# Patient Record
Sex: Male | Born: 1945 | Race: White | Hispanic: No | Marital: Married | Smoking: Current every day smoker
Health system: Southern US, Community
[De-identification: ages and names within clinical notes are randomized; demographics above are authoritative.]

## PROBLEM LIST (undated history)

## (undated) DIAGNOSIS — J449 Chronic obstructive pulmonary disease, unspecified: Secondary | ICD-10-CM

## (undated) DIAGNOSIS — R0609 Other forms of dyspnea: Secondary | ICD-10-CM

## (undated) DIAGNOSIS — N183 Chronic kidney disease, stage 3 unspecified: Secondary | ICD-10-CM

## (undated) DIAGNOSIS — I10 Essential (primary) hypertension: Secondary | ICD-10-CM

## (undated) DIAGNOSIS — M1990 Unspecified osteoarthritis, unspecified site: Secondary | ICD-10-CM

## (undated) DIAGNOSIS — I6522 Occlusion and stenosis of left carotid artery: Secondary | ICD-10-CM

## (undated) DIAGNOSIS — I739 Peripheral vascular disease, unspecified: Secondary | ICD-10-CM

## (undated) DIAGNOSIS — I639 Cerebral infarction, unspecified: Secondary | ICD-10-CM

## (undated) DIAGNOSIS — R0600 Dyspnea, unspecified: Secondary | ICD-10-CM

## (undated) DIAGNOSIS — E785 Hyperlipidemia, unspecified: Secondary | ICD-10-CM

## (undated) DIAGNOSIS — R609 Edema, unspecified: Secondary | ICD-10-CM

## (undated) HISTORY — DX: Cerebral infarction, unspecified: I63.9

## (undated) HISTORY — PX: OTHER SURGICAL HISTORY: SHX169

## (undated) HISTORY — DX: Essential (primary) hypertension: I10

## (undated) HISTORY — DX: Chronic kidney disease, stage 3 unspecified: N18.30

## (undated) HISTORY — DX: Chronic kidney disease, stage 3 (moderate): N18.3

## (undated) HISTORY — DX: Hyperlipidemia, unspecified: E78.5

## (undated) HISTORY — PX: TENDON REPAIR: SHX5111

## (undated) HISTORY — PX: TONSILLECTOMY: SUR1361

## (undated) HISTORY — DX: Dyspnea, unspecified: R06.00

## (undated) HISTORY — DX: Other forms of dyspnea: R06.09

## (undated) HISTORY — DX: Occlusion and stenosis of left carotid artery: I65.22

## (undated) HISTORY — DX: Edema, unspecified: R60.9

## (undated) HISTORY — PX: LUMBAR DISC SURGERY: SHX700

---

## 2003-10-13 ENCOUNTER — Encounter: Admission: RE | Admit: 2003-10-13 | Discharge: 2003-10-13 | Payer: Self-pay | Admitting: Cardiology

## 2003-10-17 ENCOUNTER — Ambulatory Visit (HOSPITAL_COMMUNITY): Admission: RE | Admit: 2003-10-17 | Discharge: 2003-10-18 | Payer: Self-pay | Admitting: Cardiology

## 2003-10-27 ENCOUNTER — Encounter: Payer: Self-pay | Admitting: Physician Assistant

## 2004-11-11 ENCOUNTER — Ambulatory Visit: Payer: Self-pay | Admitting: Cardiology

## 2004-11-19 ENCOUNTER — Ambulatory Visit: Payer: Self-pay

## 2004-12-01 ENCOUNTER — Ambulatory Visit: Payer: Self-pay | Admitting: Cardiology

## 2004-12-08 ENCOUNTER — Ambulatory Visit: Payer: Self-pay | Admitting: Cardiology

## 2004-12-08 ENCOUNTER — Ambulatory Visit (HOSPITAL_COMMUNITY): Admission: RE | Admit: 2004-12-08 | Discharge: 2004-12-08 | Payer: Self-pay | Admitting: Cardiology

## 2004-12-17 ENCOUNTER — Ambulatory Visit: Payer: Self-pay

## 2005-01-20 ENCOUNTER — Ambulatory Visit: Payer: Self-pay | Admitting: Cardiology

## 2006-06-05 ENCOUNTER — Ambulatory Visit: Payer: Self-pay | Admitting: Cardiology

## 2006-06-22 ENCOUNTER — Encounter (INDEPENDENT_AMBULATORY_CARE_PROVIDER_SITE_OTHER): Payer: Self-pay | Admitting: *Deleted

## 2006-06-22 ENCOUNTER — Inpatient Hospital Stay (HOSPITAL_COMMUNITY): Admission: RE | Admit: 2006-06-22 | Discharge: 2006-06-23 | Payer: Self-pay | Admitting: Vascular Surgery

## 2006-08-03 ENCOUNTER — Ambulatory Visit: Payer: Self-pay | Admitting: Cardiology

## 2006-08-22 ENCOUNTER — Ambulatory Visit: Payer: Self-pay

## 2006-08-22 ENCOUNTER — Ambulatory Visit: Payer: Self-pay | Admitting: Cardiology

## 2006-08-23 ENCOUNTER — Ambulatory Visit (HOSPITAL_COMMUNITY): Admission: RE | Admit: 2006-08-23 | Discharge: 2006-08-23 | Payer: Self-pay | Admitting: Cardiology

## 2006-08-23 ENCOUNTER — Ambulatory Visit: Payer: Self-pay | Admitting: Cardiology

## 2006-09-19 ENCOUNTER — Ambulatory Visit: Payer: Self-pay

## 2006-09-19 ENCOUNTER — Ambulatory Visit: Payer: Self-pay | Admitting: Cardiology

## 2007-01-12 ENCOUNTER — Ambulatory Visit: Payer: Self-pay | Admitting: Vascular Surgery

## 2007-03-22 ENCOUNTER — Ambulatory Visit: Payer: Self-pay | Admitting: Cardiology

## 2007-03-22 LAB — CONVERTED CEMR LAB
BUN: 12 mg/dL (ref 6–23)
CO2: 30 meq/L (ref 19–32)
Calcium: 9.2 mg/dL (ref 8.4–10.5)
Chloride: 101 meq/L (ref 96–112)
Creatinine, Ser: 1.1 mg/dL (ref 0.4–1.5)
GFR calc Af Amer: 87 mL/min
GFR calc non Af Amer: 72 mL/min
Glucose, Bld: 86 mg/dL (ref 70–99)
INR: 0.9 (ref 0.9–2.0)
Potassium: 3.8 meq/L (ref 3.5–5.1)
Prothrombin Time: 11.3 s (ref 10.0–14.0)
Sodium: 138 meq/L (ref 135–145)
aPTT: 31.9 s (ref 26.5–36.5)

## 2007-03-23 ENCOUNTER — Ambulatory Visit: Payer: Self-pay

## 2007-03-28 ENCOUNTER — Observation Stay (HOSPITAL_COMMUNITY): Admission: RE | Admit: 2007-03-28 | Discharge: 2007-03-28 | Payer: Self-pay | Admitting: Cardiology

## 2007-03-28 ENCOUNTER — Ambulatory Visit: Payer: Self-pay | Admitting: Cardiology

## 2007-04-03 ENCOUNTER — Ambulatory Visit (HOSPITAL_COMMUNITY): Admission: RE | Admit: 2007-04-03 | Discharge: 2007-04-03 | Payer: Self-pay | Admitting: Cardiology

## 2007-04-12 ENCOUNTER — Ambulatory Visit: Payer: Self-pay

## 2007-05-24 ENCOUNTER — Ambulatory Visit: Payer: Self-pay | Admitting: Cardiology

## 2007-07-11 ENCOUNTER — Ambulatory Visit: Payer: Self-pay | Admitting: Vascular Surgery

## 2007-09-18 ENCOUNTER — Encounter: Admission: RE | Admit: 2007-09-18 | Discharge: 2007-09-18 | Payer: Self-pay | Admitting: Neurosurgery

## 2007-11-20 ENCOUNTER — Ambulatory Visit: Payer: Self-pay

## 2007-11-20 ENCOUNTER — Ambulatory Visit: Payer: Self-pay | Admitting: Cardiovascular Disease

## 2007-12-28 ENCOUNTER — Inpatient Hospital Stay (HOSPITAL_COMMUNITY): Admission: RE | Admit: 2007-12-28 | Discharge: 2008-01-02 | Payer: Self-pay | Admitting: Neurosurgery

## 2008-07-16 ENCOUNTER — Ambulatory Visit: Payer: Self-pay | Admitting: Vascular Surgery

## 2008-10-17 ENCOUNTER — Ambulatory Visit: Payer: Self-pay | Admitting: Cardiovascular Disease

## 2008-10-22 ENCOUNTER — Ambulatory Visit: Payer: Self-pay | Admitting: Cardiovascular Disease

## 2008-10-22 ENCOUNTER — Ambulatory Visit (HOSPITAL_COMMUNITY): Admission: RE | Admit: 2008-10-22 | Discharge: 2008-10-22 | Payer: Self-pay | Admitting: Cardiovascular Disease

## 2008-11-12 ENCOUNTER — Ambulatory Visit: Payer: Self-pay | Admitting: Cardiovascular Disease

## 2008-11-12 ENCOUNTER — Ambulatory Visit: Payer: Self-pay

## 2009-01-09 ENCOUNTER — Ambulatory Visit: Payer: Self-pay | Admitting: Cardiovascular Disease

## 2009-01-12 ENCOUNTER — Ambulatory Visit: Payer: Self-pay

## 2009-02-23 ENCOUNTER — Inpatient Hospital Stay (HOSPITAL_COMMUNITY): Admission: RE | Admit: 2009-02-23 | Discharge: 2009-02-26 | Payer: Self-pay | Admitting: Orthopedic Surgery

## 2009-03-12 DIAGNOSIS — I70219 Atherosclerosis of native arteries of extremities with intermittent claudication, unspecified extremity: Secondary | ICD-10-CM | POA: Insufficient documentation

## 2009-03-12 DIAGNOSIS — I10 Essential (primary) hypertension: Secondary | ICD-10-CM | POA: Insufficient documentation

## 2009-03-19 ENCOUNTER — Telehealth: Payer: Self-pay | Admitting: Cardiovascular Disease

## 2009-05-20 ENCOUNTER — Ambulatory Visit: Payer: Self-pay

## 2009-05-20 ENCOUNTER — Encounter: Payer: Self-pay | Admitting: Cardiovascular Disease

## 2009-07-15 ENCOUNTER — Ambulatory Visit: Payer: Self-pay | Admitting: Vascular Surgery

## 2009-11-23 ENCOUNTER — Encounter: Payer: Self-pay | Admitting: Cardiovascular Disease

## 2009-11-24 ENCOUNTER — Encounter: Payer: Self-pay | Admitting: Cardiovascular Disease

## 2009-11-24 ENCOUNTER — Ambulatory Visit: Payer: Self-pay

## 2010-01-08 ENCOUNTER — Ambulatory Visit: Payer: Self-pay | Admitting: Vascular Surgery

## 2010-07-13 ENCOUNTER — Telehealth: Payer: Self-pay | Admitting: Cardiovascular Disease

## 2010-08-05 ENCOUNTER — Ambulatory Visit: Payer: Self-pay | Admitting: Vascular Surgery

## 2010-09-01 ENCOUNTER — Ambulatory Visit (HOSPITAL_COMMUNITY)
Admission: RE | Admit: 2010-09-01 | Discharge: 2010-09-01 | Payer: Self-pay | Source: Home / Self Care | Admitting: Vascular Surgery

## 2010-09-09 ENCOUNTER — Inpatient Hospital Stay (HOSPITAL_COMMUNITY): Admission: RE | Admit: 2010-09-09 | Discharge: 2010-09-11 | Payer: Self-pay | Admitting: Vascular Surgery

## 2010-09-10 ENCOUNTER — Ambulatory Visit: Payer: Self-pay | Admitting: Vascular Surgery

## 2010-09-16 ENCOUNTER — Telehealth: Payer: Self-pay | Admitting: Cardiovascular Disease

## 2010-09-20 ENCOUNTER — Ambulatory Visit: Payer: Self-pay | Admitting: Cardiovascular Disease

## 2010-09-23 ENCOUNTER — Ambulatory Visit: Payer: Self-pay | Admitting: Cardiovascular Disease

## 2010-09-23 ENCOUNTER — Ambulatory Visit: Payer: Self-pay | Admitting: Vascular Surgery

## 2010-10-21 ENCOUNTER — Telehealth: Payer: Self-pay | Admitting: Cardiovascular Disease

## 2010-11-12 ENCOUNTER — Encounter: Payer: Self-pay | Admitting: Cardiovascular Disease

## 2010-11-15 ENCOUNTER — Ambulatory Visit: Payer: Self-pay

## 2010-11-17 ENCOUNTER — Encounter: Payer: Self-pay | Admitting: Cardiovascular Disease

## 2011-01-06 NOTE — Progress Notes (Signed)
Summary: b/p readings   Phone Note Call from Patient Call back at Home Phone [phone]   Caller: Patient Reason for Call: Talk to Nurse Summary of Call: Per pt calling b/p average 175/70.  Initial call taken by: Neil Crouch,  October 21, 2010 1:08 PM  Follow-up for Phone Call        Pt cell [phone]  pt returned call [place]  October 21, 2010 4:06 PM  I spoke with the pt and on average his BP has been running 175/70. The pt's pulse has been in the upper 50s and lower 60s.  The pt did run out of his Atenolol-Chlorthalidone about 3 days ago and his PCP would not refill the medicine because he wants Cardiology to manage BP.  I instructed the pt to take an extra Amlodipine tonight for his BP.  I will speak with Dr Burt Knack about further medication recommendations and call the pt tomorrow.  If Dr Burt Knack would like to continue Atenolol-Chlorthalidone then I will send in a refill tomorrow.  Follow-up by: Theodosia Quay, RN, BSN,  October 21, 2010 4:47 PM  Additional Follow-up for Phone Call Additional follow up Details #1::        Per Dr Burt Knack he would like the pt to Discontinue Atenolol-Chlorthalidone and START Carvedilol 12.5mg   two times a day and INCREASE Amlodipine to 10mg  daily.  Dr Burt Knack would like the pt to continue monitoring his BP at home and call back in  10 days with readings.  I spoke with the pt and reviewed his medication changes.  New prescriptions sent to [facility] in [place]. The pt will call the office with his BP readings.   Additional Follow-up by: Theodosia Quay, RN, BSN,  October 22, 2010 3:28 PM    New/Updated Medications: CARVEDILOL 12.5 MG TABS (CARVEDILOL) Take one tablet by mouth twice a day AMLODIPINE BESYLATE 10 MG TABS (AMLODIPINE BESYLATE) Take one tablet by mouth daily Prescriptions: AMLODIPINE BESYLATE 10 MG TABS (AMLODIPINE BESYLATE) Take one tablet by mouth daily  #30 x 3   Entered by:   Theodosia Quay, RN, BSN   Authorized by:    Neale Burly, MD   Signed by:   Theodosia Quay, RN, BSN on 10/22/2010   Method used:   Electronically to        [facility] [phone]* (retail)       [address], [place]  [address]       Ph: [phone]       Fax: [phone]   RxID:   [id number] CARVEDILOL 12.5 MG TABS (CARVEDILOL) Take one tablet by mouth twice a day  #60 x 3   Entered by:   Theodosia Quay, RN, BSN   Authorized by:   Neale Burly, MD   Signed by:   Theodosia Quay, RN, BSN on 10/22/2010   Method used:   Electronically to        [facility] [phone]* (retail)       [address],   [address]       Ph: [phone]       Fax: [phone]   RxID:   [id number]

## 2011-01-06 NOTE — Miscellaneous (Signed)
Summary: Orders Update  Clinical Lists Changes  Orders: Added new Test order of Arterial Duplex Lower Extremity (Arterial Duplex Low) - Signed 

## 2011-01-06 NOTE — Assessment & Plan Note (Signed)
Summary: f/u BP  Medications Added PLAVIX 75 MG TABS (CLOPIDOGREL BISULFATE) Take one tablet by mouth daily ATENOLOL-CHLORTHALIDONE 50-25 MG TABS (ATENOLOL-CHLORTHALIDONE) Take 1 tablet by mouth once a day CRESTOR 10 MG TABS (ROSUVASTATIN CALCIUM) Take one tablet by mouth daily. AMLODIPINE BESYLATE 5 MG TABS (AMLODIPINE BESYLATE) Take one tablet by mouth daily TRIAMTERENE-HCTZ 75-50 MG TABS (TRIAMTERENE-HCTZ) Take 1 tablet by mouth once a day CELEBREX 200 MG CAPS (CELECOXIB) Take 1 tablet by mouth once a day\par      Allergies Added: NKDA  Visit Type:  Follow-up Primary Imunique Samad:  [place]  CC:  High blood pressure.  History of Present Illness: 65 year-old male with lower extremity peripheral arterial disease and carotid disease, presenting for followup evaluation. He has had recent carotid stenting to treat restenosis from prior endarterectomy. He has also noted elevated blood pressure readings of late. Lisinopril was discontinued in the hospital because of elevated creatinine.  He denies chest pain or claudication symptoms at present. He does complain of back pain. No palps, lightheadedness, or syncope. Reports mild leg swelling.  Current Medications (verified): 1)  Plavix 75 Mg Tabs (Clopidogrel Bisulfate) .... Take One Tablet By Mouth Daily 2)  Atenolol-Chlorthalidone 50-25 Mg Tabs (Atenolol-Chlorthalidone) .... Take 1 Tablet By Mouth Once A Day 3)  Crestor 10 Mg Tabs (Rosuvastatin Calcium) .... Take One Tablet By Mouth Daily. 4)  Amlodipine Besylate 5 Mg Tabs (Amlodipine Besylate) .... Take One Tablet By Mouth Daily 5)  Triamterene-Hctz 75-50 Mg Tabs (Triamterene-Hctz) .... Take 1 Tablet By Mouth Once A Day 6)  Celebrex 200 Mg Caps (Celecoxib) .... Take 1 Tablet By Mouth Once A Day\par  Allergies (verified): No Known Drug Allergies  Past History:  Past medical history reviewed for relevance to current acute and chronic problems.  Past Medical History: Reviewed history from  01/20/2009 and no changes required. 1.Carotid endarterectomy. 2. Stent in the lower extremity. 3.lower extremity peripheral arterial disease  4.stenting of both  superficial femoral arteries.  5. balloon angioplasty. (Most recent done 11/09) 6. Hypertension. 7. Exertional dyspnea. 8. Edema. 9.Hyperlipidemia. 10. 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  Review of Systems       Negative except as per HPI   Vital Signs:  Patient profile:   65 year old male Height:      74 inches Weight:      227.25 pounds BMI:     29.28 Pulse rate:   52 / minute Pulse rhythm:   regular Resp:     18 per minute BP sitting:   168 / 88  (left arm) Cuff size:   large  Vitals Entered By: Sidney Ace (September 20, 2010 3:48 PM)  Physical Exam  General:  Pt is alert and oriented, in no acute distress. HEENT: normal Neck: normal carotid upstrokes without bruits, JVP normal Lungs: CTA CV: RRR without murmur or gallop Abd: soft, NT, positive BS, no bruit, no organomegaly Ext: no clubbing, cyanosis, or edema. 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 Tobacco cessation discussed. Continue medical therapy.  Problem # 2:  HYPERTENSION, BENIGN (ICD-401.1)  BP elevated, but amlodipine just restarted a few days ago. Asked pt to  record BP's at home and bring readings in at followup. Reviewed hospital records and Creatinine was initially 1.9 and fell to 1.4 at discharge with disconitnueation of the ACE-inhibitor. He will call back in 2 weeks with readings and will determine at that time if he needs an additional agent.  The following medications were removed from the  medication list:    Lisinopril 10 Mg Tabs (Lisinopril) ..... Once daily His updated medication list for this problem includes:    Atenolol-chlorthalidone 50-25 Mg Tabs (Atenolol-chlorthalidone) .[name]... Take 1 tablet by mouth once a day    Amlodipine Besylate 5 Mg Tabs (Amlodipine besylate) .[name]... Take one tablet by mouth daily    Triamterene-hctz 75-50 Mg Tabs (Triamterene-hctz) .[name]... Take 1 tablet by mouth once a day  BP today: 168/88  Labs Reviewed: K+: 3.8 (03/22/2007) Creat: : 1.1 (03/22/2007)     Patient Instructions: 1)  Your physician recommends that you schedule a follow-up appointment in: 6 months  2)  Your physician recommends that you continue on your current medications as directed. Please refer to the Current Medication list given to you today. 3)  Please call us back with your BP readings.

## 2011-01-06 NOTE — Progress Notes (Signed)
Summary: Test and OV   Phone Note Call from Patient Call back at Home Phone [phone]   Caller: Patient Reason for Call: Talk to Nurse, Lab or Test Results Details for Reason: from 6/16 & 12/10. pt states he never receive any test results.  Initial call taken by: Neil Crouch,  July 13, 2010 10:57 AM  Follow-up for Phone Call        I called the pt's home number and his wife gave me alternate numbers to call [phone] or  [phone] (cell).   I spoke with the pt and reviewed the results of his tests (these results were called per documentation).  The pt is due for LEA in December and he is scheduled to see Dr Burt Knack in September.  We can cancel OV in September and arrange both OV and LEA with ABI (440.21) in December for pt.  The pt was concerned because he had received a call from [facility] about his refills.  This pt can get this medication refilled through December.  I will forward this message to Lela to contact the pt with new appointments in December.  Follow-up by: Theodosia Quay, RN, BSN,  July 13, 2010 11:27 AM

## 2011-01-06 NOTE — Progress Notes (Signed)
Summary: BP issues   Phone Note Call from Patient Call back at Home Phone [phone]   Caller: Patient Reason for Call: Talk to Nurse Summary of Call: pt states he feels that he needs to be seen sooner that December. He states he is having some BP issues that his PCP feels cardiology should see him for.  Initial call taken by: Lorraine Lax,  September 16, 2010 12:54 PM  Follow-up for Phone Call        The pt is currently not at home.  I will try to reach the pt on cell [phone]. I spoke with the pt and he is having problems with his BP running high.  The pt had a carotid stent placed last Friday by Dr Oneida Alar. During his hospitalization his BP was high and his pulse was in the 40s.  5 weeks prior to stent placement his Lisinopril was stopped because it was "harming his kidneys".  The pt saw his PCP today and was started on Amlodipine 5mg  daily.   The pt said his PCP wanted him to follow-up with Dr Burt Knack.  I arranged an appt for the pt on 09/20/10.  The pt will monitor his BP at home and bring in his readings and automatic cuff to appt.  Follow-up by: Theodosia Quay, RN, BSN,  September 16, 2010 3:34 PM

## 2011-02-16 LAB — CBC
HCT: 35.2 % — ABNORMAL LOW (ref 39.0–52.0)
HCT: 41.5 % (ref 39.0–52.0)
Hemoglobin: 12 g/dL — ABNORMAL LOW (ref 13.0–17.0)
Hemoglobin: 14.6 g/dL (ref 13.0–17.0)
MCH: 30.3 pg (ref 26.0–34.0)
MCH: 31.4 pg (ref 26.0–34.0)
MCHC: 34.1 g/dL (ref 30.0–36.0)
MCHC: 35.2 g/dL (ref 30.0–36.0)
MCV: 88.9 fL (ref 78.0–100.0)
MCV: 89.2 fL (ref 78.0–100.0)
Platelets: 145 10*3/uL — ABNORMAL LOW (ref 150–400)
Platelets: 162 10*3/uL (ref 150–400)
RBC: 3.96 MIL/uL — ABNORMAL LOW (ref 4.22–5.81)
RBC: 4.65 MIL/uL (ref 4.22–5.81)
RDW: 12.7 % (ref 11.5–15.5)
RDW: 12.9 % (ref 11.5–15.5)
WBC: 10.6 10*3/uL — ABNORMAL HIGH (ref 4.0–10.5)
WBC: 9.7 10*3/uL (ref 4.0–10.5)

## 2011-02-16 LAB — BASIC METABOLIC PANEL
BUN: 20 mg/dL (ref 6–23)
BUN: 27 mg/dL — ABNORMAL HIGH (ref 6–23)
BUN: 30 mg/dL — ABNORMAL HIGH (ref 6–23)
CO2: 23 mEq/L (ref 19–32)
CO2: 24 mEq/L (ref 19–32)
CO2: 29 mEq/L (ref 19–32)
Calcium: 8.2 mg/dL — ABNORMAL LOW (ref 8.4–10.5)
Calcium: 8.8 mg/dL (ref 8.4–10.5)
Calcium: 9.2 mg/dL (ref 8.4–10.5)
Chloride: 106 mEq/L (ref 96–112)
Chloride: 107 mEq/L (ref 96–112)
Chloride: 107 mEq/L (ref 96–112)
Creatinine, Ser: 1.42 mg/dL (ref 0.4–1.5)
Creatinine, Ser: 1.68 mg/dL — ABNORMAL HIGH (ref 0.4–1.5)
Creatinine, Ser: 1.89 mg/dL — ABNORMAL HIGH (ref 0.4–1.5)
GFR calc Af Amer: 44 mL/min — ABNORMAL LOW (ref >60)
GFR calc Af Amer: 50 mL/min — ABNORMAL LOW (ref >60)
GFR calc Af Amer: >60 mL/min (ref >60)
GFR calc non Af Amer: 36 mL/min — ABNORMAL LOW (ref >60)
GFR calc non Af Amer: 41 mL/min — ABNORMAL LOW (ref >60)
GFR calc non Af Amer: 50 mL/min — ABNORMAL LOW (ref >60)
Glucose, Bld: 113 mg/dL — ABNORMAL HIGH (ref 70–99)
Glucose, Bld: 146 mg/dL — ABNORMAL HIGH (ref 70–99)
Glucose, Bld: 95 mg/dL (ref 70–99)
Potassium: 4.1 mEq/L (ref 3.5–5.1)
Potassium: 4.4 mEq/L (ref 3.5–5.1)
Potassium: 4.5 mEq/L (ref 3.5–5.1)
Sodium: 135 mEq/L (ref 135–145)
Sodium: 136 mEq/L (ref 135–145)
Sodium: 139 mEq/L (ref 135–145)

## 2011-02-16 LAB — PROTIME-INR
INR: 0.9 (ref 0.00–1.49)
Prothrombin Time: 12.4 seconds (ref 11.6–15.2)

## 2011-02-16 LAB — MRSA PCR SCREENING: MRSA by PCR: NEGATIVE

## 2011-02-17 LAB — POCT I-STAT, CHEM 8
BUN: 30 mg/dL — ABNORMAL HIGH (ref 6–23)
Calcium, Ion: 1.13 mmol/L (ref 1.12–1.32)
Chloride: 112 mEq/L (ref 96–112)
Creatinine, Ser: 1.9 mg/dL — ABNORMAL HIGH (ref 0.4–1.5)
Glucose, Bld: 91 mg/dL (ref 70–99)
HCT: 44 % (ref 39.0–52.0)
Hemoglobin: 15 g/dL (ref 13.0–17.0)
Potassium: 5.1 mEq/L (ref 3.5–5.1)
Sodium: 136 mEq/L (ref 135–145)
TCO2: 20 mmol/L (ref 0–100)

## 2011-03-17 LAB — BASIC METABOLIC PANEL
BUN: 25 mg/dL — ABNORMAL HIGH (ref 6–23)
BUN: 26 mg/dL — ABNORMAL HIGH (ref 6–23)
BUN: 29 mg/dL — ABNORMAL HIGH (ref 6–23)
CO2: 26 mEq/L (ref 19–32)
CO2: 27 mEq/L (ref 19–32)
CO2: 28 mEq/L (ref 19–32)
Calcium: 8.1 mg/dL — ABNORMAL LOW (ref 8.4–10.5)
Calcium: 8.2 mg/dL — ABNORMAL LOW (ref 8.4–10.5)
Calcium: 8.3 mg/dL — ABNORMAL LOW (ref 8.4–10.5)
Chloride: 102 mEq/L (ref 96–112)
Chloride: 103 mEq/L (ref 96–112)
Chloride: 99 mEq/L (ref 96–112)
Creatinine, Ser: 1.33 mg/dL (ref 0.4–1.5)
Creatinine, Ser: 1.74 mg/dL — ABNORMAL HIGH (ref 0.4–1.5)
Creatinine, Ser: 2.16 mg/dL — ABNORMAL HIGH (ref 0.4–1.5)
GFR calc Af Amer: 37 mL/min — ABNORMAL LOW (ref >60)
GFR calc Af Amer: 48 mL/min — ABNORMAL LOW (ref >60)
GFR calc Af Amer: >60 mL/min (ref >60)
GFR calc non Af Amer: 31 mL/min — ABNORMAL LOW (ref >60)
GFR calc non Af Amer: 40 mL/min — ABNORMAL LOW (ref >60)
GFR calc non Af Amer: 54 mL/min — ABNORMAL LOW (ref >60)
Glucose, Bld: 107 mg/dL — ABNORMAL HIGH (ref 70–99)
Glucose, Bld: 89 mg/dL (ref 70–99)
Glucose, Bld: 96 mg/dL (ref 70–99)
Potassium: 4.1 mEq/L (ref 3.5–5.1)
Potassium: 4.4 mEq/L (ref 3.5–5.1)
Potassium: 4.4 mEq/L (ref 3.5–5.1)
Sodium: 133 mEq/L — ABNORMAL LOW (ref 135–145)
Sodium: 134 mEq/L — ABNORMAL LOW (ref 135–145)
Sodium: 138 mEq/L (ref 135–145)

## 2011-03-17 LAB — CBC
HCT: 28.6 % — ABNORMAL LOW (ref 39.0–52.0)
HCT: 31 % — ABNORMAL LOW (ref 39.0–52.0)
HCT: 34.9 % — ABNORMAL LOW (ref 39.0–52.0)
HCT: 44.8 % (ref 39.0–52.0)
Hemoglobin: 10.2 g/dL — ABNORMAL LOW (ref 13.0–17.0)
Hemoglobin: 10.8 g/dL — ABNORMAL LOW (ref 13.0–17.0)
Hemoglobin: 12.2 g/dL — ABNORMAL LOW (ref 13.0–17.0)
Hemoglobin: 15.7 g/dL (ref 13.0–17.0)
MCHC: 34.8 g/dL (ref 30.0–36.0)
MCHC: 34.9 g/dL (ref 30.0–36.0)
MCHC: 35 g/dL (ref 30.0–36.0)
MCHC: 35.6 g/dL (ref 30.0–36.0)
MCV: 88.6 fL (ref 78.0–100.0)
MCV: 88.6 fL (ref 78.0–100.0)
MCV: 89.2 fL (ref 78.0–100.0)
MCV: 89.7 fL (ref 78.0–100.0)
Platelets: 123 10*3/uL — ABNORMAL LOW (ref 150–400)
Platelets: 126 10*3/uL — ABNORMAL LOW (ref 150–400)
Platelets: 149 10*3/uL — ABNORMAL LOW (ref 150–400)
Platelets: 163 10*3/uL (ref 150–400)
RBC: 3.23 MIL/uL — ABNORMAL LOW (ref 4.22–5.81)
RBC: 3.47 MIL/uL — ABNORMAL LOW (ref 4.22–5.81)
RBC: 3.89 MIL/uL — ABNORMAL LOW (ref 4.22–5.81)
RBC: 5.06 MIL/uL (ref 4.22–5.81)
RDW: 12.4 % (ref 11.5–15.5)
RDW: 12.8 % (ref 11.5–15.5)
RDW: 13 % (ref 11.5–15.5)
RDW: 13.3 % (ref 11.5–15.5)
WBC: 10.4 10*3/uL (ref 4.0–10.5)
WBC: 12.4 10*3/uL — ABNORMAL HIGH (ref 4.0–10.5)
WBC: 12.6 10*3/uL — ABNORMAL HIGH (ref 4.0–10.5)
WBC: 12.9 10*3/uL — ABNORMAL HIGH (ref 4.0–10.5)

## 2011-03-17 LAB — DIFFERENTIAL
Basophils Absolute: 0 10*3/uL (ref 0.0–0.1)
Basophils Relative: 1 % (ref 0–1)
Eosinophils Absolute: 0.2 10*3/uL (ref 0.0–0.7)
Eosinophils Relative: 2 % (ref 0–5)
Lymphocytes Relative: 23 % (ref 12–46)
Lymphs Abs: 2.4 10*3/uL (ref 0.7–4.0)
Monocytes Absolute: 0.8 10*3/uL (ref 0.1–1.0)
Monocytes Relative: 7 % (ref 3–12)
Neutro Abs: 6.9 10*3/uL (ref 1.7–7.7)
Neutrophils Relative %: 67 % (ref 43–77)

## 2011-03-17 LAB — TYPE AND SCREEN
ABO/RH(D): AB POS
Antibody Screen: NEGATIVE

## 2011-03-17 LAB — COMPREHENSIVE METABOLIC PANEL
ALT: 15 U/L (ref 0–53)
AST: 23 U/L (ref 0–37)
Albumin: 3 g/dL — ABNORMAL LOW (ref 3.5–5.2)
Alkaline Phosphatase: 62 U/L (ref 39–117)
BUN: 24 mg/dL — ABNORMAL HIGH (ref 6–23)
CO2: 28 mEq/L (ref 19–32)
Calcium: 8.9 mg/dL (ref 8.4–10.5)
Chloride: 105 mEq/L (ref 96–112)
Creatinine, Ser: 1.37 mg/dL (ref 0.4–1.5)
GFR calc Af Amer: >60 mL/min (ref >60)
GFR calc non Af Amer: 52 mL/min — ABNORMAL LOW (ref >60)
Glucose, Bld: 84 mg/dL (ref 70–99)
Potassium: 4.4 mEq/L (ref 3.5–5.1)
Sodium: 139 mEq/L (ref 135–145)
Total Bilirubin: 0.5 mg/dL (ref 0.3–1.2)
Total Protein: 5.3 g/dL — ABNORMAL LOW (ref 6.0–8.3)

## 2011-03-17 LAB — PROTIME-INR
INR: 0.9 (ref 0.00–1.49)
INR: 1.1 (ref 0.00–1.49)
INR: 1.7 — ABNORMAL HIGH (ref 0.00–1.49)
INR: 2.6 — ABNORMAL HIGH (ref 0.00–1.49)
Prothrombin Time: 12.6 seconds (ref 11.6–15.2)
Prothrombin Time: 14.7 seconds (ref 11.6–15.2)
Prothrombin Time: 20.8 seconds — ABNORMAL HIGH (ref 11.6–15.2)
Prothrombin Time: 30.1 seconds — ABNORMAL HIGH (ref 11.6–15.2)

## 2011-03-17 LAB — URINALYSIS, ROUTINE W REFLEX MICROSCOPIC
Bilirubin Urine: NEGATIVE
Glucose, UA: NEGATIVE mg/dL
Ketones, ur: NEGATIVE mg/dL
Leukocytes, UA: NEGATIVE
Nitrite: NEGATIVE
Protein, ur: >300 mg/dL — AB
Specific Gravity, Urine: 1.011 (ref 1.005–1.030)
Urobilinogen, UA: 0.2 mg/dL (ref 0.0–1.0)
pH: 6 (ref 5.0–8.0)

## 2011-03-17 LAB — APTT: aPTT: 46 seconds — ABNORMAL HIGH (ref 24–37)

## 2011-03-17 LAB — URINE MICROSCOPIC-ADD ON

## 2011-03-17 LAB — BRAIN NATRIURETIC PEPTIDE: Pro B Natriuretic peptide (BNP): 369 pg/mL — ABNORMAL HIGH (ref 0.0–100.0)

## 2011-03-24 ENCOUNTER — Other Ambulatory Visit (INDEPENDENT_AMBULATORY_CARE_PROVIDER_SITE_OTHER): Payer: Medicare Other

## 2011-03-24 DIAGNOSIS — I6529 Occlusion and stenosis of unspecified carotid artery: Secondary | ICD-10-CM

## 2011-03-24 DIAGNOSIS — Z48812 Encounter for surgical aftercare following surgery on the circulatory system: Secondary | ICD-10-CM

## 2011-03-31 NOTE — Procedures (Unsigned)
CAROTID DUPLEX EXAM  INDICATION:  Carotid artery disease.  HISTORY: Diabetes:  No. Cardiac:  No. Hypertension:  Yes. Smoking:  Yes. Previous Surgery:  Left carotid endarterectomy, 06/22/2006; left common carotid artery stent placed 09/10/2010. CV History:  Currently asymptomatic. Amaurosis Fugax No, Paresthesias No, Hemiparesis No. Other:  Hyperlipidemia.                                      RIGHT             LEFT Brachial systolic pressure:         165               162 Brachial Doppler waveforms:         Normal            Normal Vertebral direction of flow:        Antegrade         Antegrade DUPLEX VELOCITIES (cm/sec) CCA peak systolic                   93                86 ECA peak systolic                   131               99 ICA peak systolic                   76                [id number] ICA end diastolic                   17                61 PLAQUE MORPHOLOGY:                  Heterogenous      Mixed PLAQUE AMOUNT:                      Minimal           Moderate PLAQUE LOCATION:                    ICA, ECA, bifurcation               ICA, bifurcation  IMPRESSION: 1. Right internal carotid artery velocities suggest 1% to 39%     stenosis. 2. Patent left common carotid artery stent and carotid endarterectomy     site. 3. Homogenous plaque noted at the proximal internal carotid artery     with velocities suggestive of 60% to 79% stenosis.  ___________________________________________ Jessy Oto Fields, MD  EM/MEDQ  D:  03/24/2011  T:  03/24/2011  Job:  [id number]

## 2011-04-19 NOTE — Procedures (Signed)
CAROTID DUPLEX EXAM   INDICATION:  Carotid stenosis, status post left stent placement.   HISTORY:  Diabetes:  No.  Cardiac:  No.  Hypertension:  Yes.  Smoking:  Yes.  Previous Surgery:  Left CEA, 06/22/2006, left CCA stent, 09/10/2010.  CV History:  Asymptomatic.  Amaurosis Fugax , Paresthesias , Hemiparesis                                       RIGHT             LEFT  Brachial systolic pressure:  Brachial Doppler waveforms:  Vertebral direction of flow:        Antegrade         Antegrade  DUPLEX VELOCITIES (cm/sec)  CCA peak systolic                   103               81  ECA peak systolic                   136               0000000  ICA peak systolic                   92                [id number]  ICA end diastolic                   33                68  PLAQUE MORPHOLOGY:                  Heterogenous      Heterogenous  PLAQUE AMOUNT:                      Mild              Moderate  PLAQUE LOCATION:                    ICA, ECA          ICA   IMPRESSION:  1. No hemodynamically significant stenosis of the right internal      carotid artery with mild plaque formations noted.  2. Left internal carotid artery velocities suggest 60% to 79%      stenosis.  3. There is a significant increase in the left internal carotid artery      velocities as compared with the study performed 6 weeks ago.  4. Left common carotid artery stent appears patent.   ___________________________________________  Jessy Oto. Oneida Alar, MD   EM/MEDQ  D:  09/23/2010  T:  09/23/2010  Job:  [id number]

## 2011-04-19 NOTE — Discharge Summary (Signed)
NAME:  [name], [name] NO.:  [id number]   MEDICAL RECORD NO.:  [id number]          PATIENT TYPE:  INP   LOCATION:  5004                         FACILITY:  [facility]   PHYSICIAN:  Audree Camel. Noemi Chapel, M.D. DATE OF BIRTH:  [birth year]   DATE OF ADMISSION:  02/23/2009  DATE OF DISCHARGE:  02/26/2009                               DISCHARGE SUMMARY   ADMITTING DIAGNOSES:  1. End-stage degenerative joint disease, left knee.  2. Hypertension.  3. Coronary artery disease.  4. Peripheral vascular disease.   DISCHARGE DIAGNOSES:  1. End-stage degenerative joint disease, left knee, status post total      knee replacement.  2. Hypertension.  3. Coronary artery disease.  4. Peripheral vascular disease.  5. Postop blood loss anemia.  6. Hypocalcemia.  7. Renal insufficiency.  8. Postop atelectasis.   HISTORY OF PRESENT ILLNESS:  The patient is a 65 year old white male  with history of significant peripheral vascular disease.  He has  undergone stent placement of his left iliac and left femoral artery and  underwent a balloon angioplasty in November 2009 for peripheral vascular  disease.  Despite these significant findings and surgical treatment, he  still continues to smoke.  However, with these risk factors, we felt it  was medically necessary to not use a tourniquet during surgery because  the risk of clotting off these severely diseased vessels.  The patient  tolerated the procedure well and was admitted postoperatively for pain  control, DVT prophylaxis, and physical therapy.   PROCEDURES IN-HOUSE:  On February 23, 2009, the patient underwent a left  total knee replacement by Dr. Noemi Chapel, a left femoral nerve block by  Anesthesia.  Postop day 0, the patient was given Narcan in the middle of  night due to excessive use of his PCA.  The patient was switched to p.o.  pain medicine, had a significant amount of difficulty with postop pain  control.  On postop day #1, had  significant renal insufficiency with a  BUN of 26 and a creatinine of 2.16.  His hemoglobin was stable at 12.2.  He tolerated the CPM 0-30 degrees for an hour.  He declined physical  therapy.  The patient's pain med was increased.  He was given Valium for  muscle spasms in hopes of him being able to tolerate exercise better.  On postop day #2, the patient's hemoglobin was still stable at 10.8.  Renal function was improved at 29.17.  O2 sats were 90% on 2 liters.  A  chest x-ray was ordered.  Chest x-ray showed some atelectasis, but no  infiltrate or signs of acute disease.  BMP was slightly elevated, but in  the setting of the patient's renal insufficiency no more Lasix was  given.  TED hose were not used on the patient's operative leg due to his  significant for peripheral vascular disease, PlexiPulses were used  instead.  The patient ambulated 80 feet on postop day #2 with physical  therapy.  He still did not do well on his CPM 0-30 degrees.  Range of  motion was 0-45 degrees during  physical therapy.  On postop day #3, the  patient was alert and oriented.  He was in the CPM 0-30 degrees.  He  repetitively declined, increasing CPM and repetitively requested to be  removed from it.  The patient states he does not want to do any  exercises that hurt.  I had a long discussion with the patient  understanding that he has just recently had surgery in part of his rehab  processes to aggressively do exercises.  Percocet and Valium was too  sedating for him, so he is currently on hydrocodone and Robaxin.  On  postop day #3, his labs show renal function is all much normal with a  BUN of 25 and a creatinine of 1.33.  Hemoglobin stable at 10.2.  INR is  therapeutic at 2.6.  He is afebrile.  His O2 sats are 95% on room air.  He has moderate amount of swelling in his left leg with a mild amount of  drainage.  Dopplers shows biphasic pulses in both, the dorsalis pedis  and the posterior tibial pulse,  posterior tibial arteries.  He is being  discharged to home today in stable condition.  Weightbearing as  tolerated, on a regular diet.   DISCHARGE MEDICATIONS:  1. Norco 5/325 one to two q.4-6 h p.r.n. pain  2. Robaxin 500 mg 1 q.6 h. p.r.n. muscle spasm.  3. Coumadin 5 mg tablets, directions given by pharmacist before      discharge.  4. Atenolol/chlorthalidone 50/25 one tablet daily in the evening.  5. Crestor 10 mg daily.  6. Aspirin 325 mg daily.  7. Lisinopril 10 mg daily.   He has been instructed to use his CPM 0-40 degrees 6 hours a day,  increasing by 5 degrees a day.  He has been instructed to elevate his  left heel on a folded pillow 30 minutes every morning.  He will get home  health physical therapy, home health occupational therapy, and home  health nursing.  He will follow up with Dr. Noemi Chapel on March 09, 2009, for  a wound check, stitch removal, and x-rays.      Kirstin Shepperson, P.A.      Robert A. Noemi Chapel, M.D.  Electronically Signed    KS/MEDQ  D:  02/26/2009  T:  02/26/2009  Job:  [id number]

## 2011-04-19 NOTE — Procedures (Signed)
CAROTID DUPLEX EXAM   INDICATION:  Followup known carotid artery disease and left carotid  endarterectomy.   HISTORY:  Diabetes:  No.  Cardiac:  No.  Hypertension:  Yes.  Smoking:  Yes.  Previous Surgery:  Left carotid endarterectomy on 06/22/2006 and history  of bilateral lower extremity stent.  CV History:  No.  Amaurosis Fugax No, Paresthesias No, Hemiparesis No                                       RIGHT             LEFT  Brachial systolic pressure:         160               140  Brachial Doppler waveforms:         Biphasic          Biphasic  Vertebral direction of flow:        Antegrade         Antegrade  DUPLEX VELOCITIES (cm/sec)  CCA peak systolic                   108               79  ECA peak systolic                   166               0000000  ICA peak systolic                   115               [id number]  ICA end diastolic                   28                79  PLAQUE MORPHOLOGY:                  Heterogeneous     Heterogeneous  PLAQUE AMOUNT:                      Mild              Moderate to severe  PLAQUE LOCATION:                    ICA and ECA       ICA and ECA   IMPRESSION:  1. 60-79% stenosis noted in the left ICA.  2. Status post left carotid endarterectomy.  3. 20-39% stenosis noted in the right ICA.  4. Antegrade bilateral vertebral arteries.       ___________________________________________  Jessy Oto Fields, MD   MG/MEDQ  D:  07/15/2009  T:  07/15/2009  Job:  [id number]

## 2011-04-19 NOTE — Op Note (Signed)
NAME:  [name], [name] NO.:  [id number]   MEDICAL RECORD NO.:  [id number]          PATIENT TYPE:  AMB   LOCATION:  SDS                          FACILITY:  [facility]   PHYSICIAN:  Ethelle Lyon, MD  DATE OF BIRTH:  [birth year]   DATE OF PROCEDURE:  04/03/2007  DATE OF DISCHARGE:                               OPERATIVE REPORT   PROCEDURE:  1. Left leg angiography via contralateral access.  2. Selective catheterization of the left superficial femoral artery      via contralateral access.  3. Balloon angioplasty of the left superficial femoral artery,      stenting of the left superficial femoral artery.  4. StarClose closure of the right common femoral arteriotomy site.   INDICATIONS:  Mr. Slyter is a 65 year old gentleman with lifestyle-  limiting claudication in the setting of longstanding peripheral arterial  disease.  Angiography in September 2007 had demonstrated 90% stenosis of  the left SFA.  We have managed that conservatively after treatment of  disease on the right.  He had initially done well.  However, he now re-  presents with lifestyle-limiting claudication on the left and requests  revascularization.  This occurs after repeat revascularization of the  right SFA and a new section last week.  His left ABI is 0.71.   PROCEDURAL TECHNIQUE:  Informed consent was obtained.  Under 1%  lidocaine local anesthesia, a 6-French Terumo Glide sheath was placed in  the right common femoral artery using the modified Seldinger technique.  I used a crossover catheter to selectively engage the left common iliac  artery.  After being unsuccessful in advancing a Wholey wire into the  external iliac, I was able to advance an angled Glidewire into the  external iliac.  I then exchanged via the crossover catheter for an  Amplatz wire.  With the Amplatz wire in place, I advanced the sheath  into the distal external iliac.  I then performed angiography via the  sheath of  the entirety of the leg using digital subtraction and step-  table technique.  This demonstrated his only significant lesion be a  focal 90% stenosis of the SFA at the abductor canal with a short segment  and we decided to proceed to balloon angioplasty.   Anticoagulation was initiated with bivalirudin.  ACT was confirmed to be  greater than 225 seconds.  A Wholey wire was advanced across the lesion  without difficulty.  I began by dilating the lesion using a 6 x 20-mm  Powerflex balloon for 2 inflations each at 8 atmospheres; this resulted  in a circumferential dissection.  While it was not flow-limiting, it was  fairly deep.  I was concerned with a relatively high risk of acute  closure.  Therefore, I decided to stent.  I placed an 8 x 60-mm Smart  stent across the lesion.  I then postdilated the stent using a 7 x 40-mm  Powerflex for 3 inflations at 8 atmospheres, covering the entirety of  the stented segment.  I then repeated angiography including runoff  vessels; that demonstrated no residual stenosis  and persistent three-  vessel runoff.   The sheath was then pulled back and the arteriotomy closed using a  StarClose device.  Complete hemostasis was obtained.  He was then  transferred to the holding room in stable condition, having tolerated  the procedure well.   COMPLICATIONS:  None.   FINDINGS:  Normal common femoral and profunda.  The SFA has very minimal  disease along its course, except for a focal stenosis of 90% at the  abductor canal.  After unsuccessful balloon angioplasty, this was  treated with stenting with excellent result.  The popliteal was free of  significant disease.  There is three-vessel runoff to the foot.   IMPRESSION/RECOMMENDATIONS:  Successful stenting of the left superficial  femoral artery with excellent result.  The patient is to be maintained  on Plavix for 30 days.      Ethelle Lyon, MD  Electronically Signed     WED/MEDQ  D:   04/03/2007  T:  04/03/2007  Job:  [id number]

## 2011-04-19 NOTE — Assessment & Plan Note (Signed)
OFFICE VISIT   [name], [name]  DOB:  07-13-1946                                       08/05/2010  [id number]   CHIEF COMPLAINT:  Carotid stenosis.   HISTORY OF PRESENT ILLNESS:  The patient is a 65 year old male who  returns today for further followup.  He underwent a left carotid  endarterectomy in July 2007.  Since that time, he has had no symptoms of  TIA, amaurosis, or stroke.  He states that he has been doing well  overall.  He has not been evaluated in our office since August 2008.  Atherosclerotic risk factors and chronic medical problems continue to  remain:  Hypertension, elevated cholesterol, and tobacco abuse.  He  currently is smoking 1 pack of cigarettes per day.  He continues to deny  any symptoms of TIA, amaurosis, or stroke.  He continues to take an  aspirin daily.   PAST MEDICAL HISTORY:  Remarkable for hyperlipidemia and ocular  migraines.   PAST SURGICAL HISTORY:  1. Previous external iliac artery and common iliac artery stent on the      left side by Dr. Rushie Chestnut.  2. Lumbar spine surgery x2.  3. Tonsillectomy.  4. Tendon repair of his left arm.   MEDICATIONS:  1. Lisinopril.  2. Celebrex.  3. Crestor.  4. Atenolol.  5. Aspirin 325 mg once a day.   ALLERGIES:  He states that he has had a problem with PLAVIX in the past  and had a rash.   SOCIAL HISTORY:  He is married and has 2 children.  Smoking history:  As  listed above.  He is self-employed.   REVIEW OF SYSTEMS:  A full 12-point review of systems was performed with  the patient today.  Please see intake referral form for details  regarding this.   PHYSICAL EXAM:  Blood pressure is 172/78 in the right arm, 175/83 in the  left arm.  Heart rate 62 and regular.  Temperature is 98.  HEENT:  Unremarkable.  Neck has 2+ carotid pulses with bilateral carotid bruits.  Chest:  Clear to auscultation.  Cardiac exam is regular rate and rhythm  without murmur.  Abdomen  is soft, nontender, nondistended, no masses.  Extremities:  He has 2+ radial and 2+ femoral pulses bilaterally.  Musculoskeletal exam shows no obvious major joint deformities.  He does  have bilateral knee replacement scars.  Neurologic exam shows symmetric  upper extremity and lower extremity motor strength which is 5/5.  Skin  has no open ulcers or rashes.   He had a carotid duplex exam today which shows a high-grade recurrent  left internal carotid artery stenosis apparently at the proximal end of  the patch.  Plaque morphology was heterogeneous in character.  He had a  40% to 60% stenosis on the right side.   IMPRESSION:  Recurrent high-grade stenosis, left carotid artery,  asymptomatic.  I discussed with the patient today that he will need a  reintervention for his left carotid for stroke prophylaxis.  The first  step of this will be a carotid angiogram.  Potentially, he might be a  candidate for carotid stenting.  However, I would prefer to have him on  Plavix during this procedure.  He has had a rash and he thought this was  due to Plavix in the past.  I have given him a sample of Plavix today as  a trial.  If he develops a rash again, we will still proceed with  carotid angiography.  We will consider a redo carotid endarterectomy  rather than stenting.  If he tolerates the Plavix reasonably well, we  will possibly do a carotid stent procedure at the time of his angiogram.  His angiogram is scheduled for August 20, 2010.  Risks, benefits,  possible complications, and procedure details of the procedure as well  as redo carotid endarterectomy were explained to him today including but  not limited to bleeding, infection, cranial nerve injury, or stroke.  He  understands and agrees to proceed.     Jessy Oto. Fields, MD  Electronically Signed   CEF/MEDQ  D:  08/05/2010  T:  08/06/2010  Job:  [id number]   cc:   Abbe Amsterdam

## 2011-04-19 NOTE — Assessment & Plan Note (Signed)
OFFICE VISIT   DEZMEND, GORALCZYK  DOB:  08/08/1946                                       09/23/2010  [id number]   The patient returns for followup today.  He underwent left carotid  stenting on 09/10/2010.  He was discharged to home after an uneventful  post procedure course.  He has still had some problems with blood  pressure control and currently his primary doctor is regulating this for  him.  He is currently on amlodipine and atenolol with reasonable blood  pressure control.  He is checking his blood pressure at home.  He denies  any headaches.  He denies any neurologic symptoms such as amaurosis, TIA  or stroke symptoms.   On review of system he denies any chest pain or shortness of breath.   PHYSICAL EXAM:  Blood pressure is 169/75 in the right arm, 166/80 in the  left arm, heart rate 54 and regular.  Neurologic exam shows symmetric  upper extremity and lower extremity motor strength which is 5/5.  Neck  shows no carotid bruits.  Cardiac exam is regular rate and rhythm  without murmur.   The patient is doing well post left carotid stenting.  He did have  carotid duplex exam today which showed increased velocities on the left  side but this is consistent with a recent stent procedure.  We will  continue following him with surveillance ultrasound over time.  He will  follow up in six months' time for a repeat carotid duplex exam.  He will  continue his Plavix for a total of 30 more days and then will be on  aspirin alone.  He will be on combined aspirin and Plavix for that 30  day period and this was emphasized to the patient today.     Jessy Oto. Fields, MD  Electronically Signed   CEF/MEDQ  D:  09/23/2010  T:  09/24/2010  Job:  [id number]   cc:   Abbe Amsterdam

## 2011-04-19 NOTE — H&P (Signed)
NAMEWILMON, CRITCHER NO.:  [id number]   MEDICAL RECORD NO.:  [id number]          PATIENT TYPE:  INP   LOCATION:  2899                         FACILITY:  [facility]   PHYSICIAN:  Leeroy Cha, M.D.   DATE OF BIRTH:  1946-11-16   DATE OF ADMISSION:  12/28/2007  DATE OF DISCHARGE:                              HISTORY & PHYSICAL   Mr. [name] is a gentleman who about 11:30 yesterday underwent  diskectomy at the level of L3-L4 and L4-5 associated with laminectomy.  The patient came to see me in September with a history of back pain  radiation to both legs, right worse than left one.  He has had Doppler  study of both lower extremities and he was negative.  He had an MRI and  later on a myelogram and because of findings he is being admitted for  surgery.  The pain is most associated with walking and walking less than  2 blocks, he had to sit because the pain gets intense, and the only  relief is to stop and sit.   PAST MEDICAL HISTORY:  1. Lumbar surgery x2.  2. Carotid endarterectomy.  3. A stent in the lower extremity.  4. HE IS ALLERGIC TO PLAVIX AND MORPHINE.   FAMILY HISTORY:  Negative.   REVIEW OF SYSTEMS:  Positive for back pain, right leg pain, high  cholesterol, high blood pressure.   PHYSICAL EXAMINATION:  HEAD, NOSE AND THROAT: Normal.  NECK:  Normal.  LUNGS:  Clear.  HEART SOUNDS:  Normal, no murmur.  EXTREMITIES:  Normal pulses.  NEURO:  Shows some weakness on dorsiflexion of both legs.  He has no  atrophy.  The femoral stretch maneuver is positive bilaterally.   The lumbar spine x-ray showed recurrent stenosis at the L3-4 L4-5 with  degenerative disk disease at L4-5.   IMPRESSION:  Lumbar stenosis secondary to lumbar stenosis 3-4, 4-5 and  51 with degenerative disk disease at the level of 4-5.   RECOMMENDATION:  I talked to the patient at length.  The procedure, as I  told him in the office, was to be bilateral 3-4, 4-5, 5-1 laminectomy  and  foraminotomy with posterolateral arthrodesis using his own bone and  possible BMP.  Nevertheless, I told him that I will leave the option for  me to make a decision about the need of  doing that further surgery at L4-5, including the possibility of  interbody fusion, but I do think that will be the case but nevertheless  is also an option.  I have fully explained to him in the office again  before surgery.  The risk of infection, CSF leakage, and no improvement  whatsoever.           ______________________________  Leeroy Cha, M.D.     EB/MEDQ  D:  12/28/2007  T:  12/28/2007  Job:  [id number]

## 2011-04-19 NOTE — Assessment & Plan Note (Signed)
[facility]                            CARDIOLOGY OFFICE NOTE   NAME:[name], [name]                       MRN:          [id number]  DATE:11/12/2008                            DOB:          September 06, 1946    REASON FOR VISIT:  Lower extremity peripheral arterial disease.   HISTORY OF PRESENT ILLNESS:  [name] is a 65 year old gentleman with  lower extremity peripheral arterial disease.  He has had previous  stenting procedures in his iliac and superficial femoral arteries.  He  presented with leg weakness and intermittent claudication, and his ABI  was diminished at 0.5.  His duplex scans showed severe left superficial  femoral artery stenosis.  He underwent angiography and balloon  angioplasty of the left superficial femoral artery on October 22, 2008.  There was a 90% stenosis just beyond stented segment in the vessel that  was treated with a scoring balloon with an excellent result.  He  underwent ABIs prior to his visit, and his left ABI has improved to 0.86  and the right is 0.77.   From a symptomatic standpoint, his leg like feels better.  He continues  to have residual left knee problems and is planning on upcoming left  knee replacement by Dr. Noemi Chapel.  His calf pain has resolved.  He has no  other complaints today.  He denies chest pain, dyspnea, edema,  orthopnea, PND, or palpitations.   MEDICATIONS:  1. Atenolol/chlorthalidone 50/25 mg daily.  2. Crestor 10 mg daily.  3. Aspirin 325 mg daily.  4. Amlodipine 5 mg daily.  5. Effient 10 mg daily.   ALLERGIES:  PLAVIX causes rash.   PHYSICAL EXAMINATION:  GENERAL:  The patient is alert and oriented.  He  is in no acute distress.  VITAL SIGNS:  Weight is 229 pounds, blood pressure is 142/78, heart rate  is 64, respiratory rate is 12.  HEENT:  Normal.  NECK:  Normal carotid upstrokes.  JVP normal.  There are no carotid  bruits.  LUNGS:  Clear bilaterally.  HEART:  Regular rate and  rhythm.  No murmurs or gallops.  ABDOMEN:  Soft, nontender.  No organomegaly.  EXTREMITIES:  Femoral pulses are 2+ and equal.  Dorsalis pedis pulses  are 2+ and equal bilaterally.  Posterior tibial pulses are 1+  bilaterally.  SKIN:  Warm and dry.  There is no rash.   EKG shows sinus bradycardia and is otherwise within normal limits.   ASSESSMENT:  1. Lower extremity peripheral arterial disease.  Improved ankle-      brachial index and symptoms following left superficial femoral      artery angioplasty.  Fortunately, [name] has nearly stopped      smoking.  He will complete a 30-day course of Effient.  This will      be completed on November 21, 2008.  He should continue on aspirin.      It is acceptable for his aspirin to be held around the time of his      upcoming knee surgery if necessary.  I would like to see  him back      in 6 months for followup.  He was encouraged regarding tobacco      cessation.  2. Hypertension.  He remains on atenolol/chlorthalidone as well as      amlodipine.  3. Dyslipidemia.  He is treated with Crestor.  He is followed by his      primary care physician.  4. Preoperative cardiac assessment.  [name] has no history of      coronary artery disease.  He has no symptoms suggestive of angina.      His EKG is essentially within normal limits.  He is acceptable risk      for upcoming surgery.   For followup, I will see [name] in 6 months.     Juanda Bond. Burt Knack, MD  Electronically Signed    MDC/MedQ  DD: 11/12/2008  DT: 11/13/2008  Job #: [id number]   cc:   Herbie Baltimore A. Noemi Chapel, M.D.  Abbe Amsterdam

## 2011-04-19 NOTE — Op Note (Signed)
NAME:  [name], [name]                ACCOUNT NO.:  [id number]   MEDICAL RECORD NO.:  [id number]          PATIENT TYPE:  AMB   LOCATION:  SDS                          FACILITY:  [facility]   PHYSICIAN:  Juanda Bond. Burt Knack, MD  DATE OF BIRTH:  [birth year]   DATE OF PROCEDURE:  10/22/2008  DATE OF DISCHARGE:                               OPERATIVE REPORT   PROCEDURES:  1. Abdominal aortogram.  2. Selective left iliac angiogram with runoff to the foot.  3. Left superficial femoral artery percutaneous transluminal      angioplasty with an angioscope scoring balloon.  4. Right external iliac angiogram with runoff to the foot.   PROCEDURAL INDICATIONS:  [name] is a 65 year old gentleman with  lower extremity PAD.  He has had multiple stenting procedures in the  iliacs and superficial femoral arteries.  He presented with leg weakness  and intermittent claudication and was found to have severe left SFA  stenosis by his duplex scan.  He was brought in for angiography and  possible PTA.   Risks and indications of the procedure were reviewed with the patient.  Informed consent was obtained.  The right groin was prepped, draped, and  anesthetized with 1% lidocaine.  Using a modified Seldinger technique, a  5-French sheath was placed in the right femoral artery.  A Wholey wire  was used for access and a pigtail catheter was taken into the abdominal  aorta at the level of the renal arteries.  An abdominal aortogram was  performed under digital subtraction.  The pigtail catheter was then  brought down to the distal aorta and an aortoiliac angiogram was  performed.  At that point, the Avala wire and a crossover catheter were  used to access the left iliac artery and that was then changed out over  the wire for an end-hole catheter placed in the left external iliac  artery where angiography with runoff to the foot was performed using the  step table method under digital subtraction.  This demonstrated  severe  stenosis just proximal to a stent at the level of Hunter's canal in the  left superficial femoral artery.  There was a stent in that region that  had moderate diffuse in-stent restenosis as well.  There was a 2-vessel  runoff to the foot and there was no other significant disease seen in  the iliac vessels or SFA.  Heparin was given for anticoagulation.  A  total of 6000 units was given.  The ACT was over 200.  A 6-French angled  Terumo sheath was used for crossing over the aortic bifurcation and it  was placed in the left external iliac artery.  A 300-cm Cougar guidewire  was used and passed easily beyond the area of stenosis into the distal  popliteal artery.  I elected to use a 5 x 40 mm angioscope scoring  balloon which was taken to 14 atmospheres in the stented segment to  treat that area of moderate restenosis and it was taken to 8 atmospheres  for 2 long inflations in the proximal severe stenotic region.  There  was  also a low-pressure inflation done off the distal edge of the stent to  treat an area of edge restenosis.  The final angiogram was performed and  showed good angiographic result with a less than 10% residual stenosis  proximal to the stent and a good lumen size through the stented segment.  There was a residual 30% stenosis on the distal edge of the stent that I  elected not to treat any further.  The patient tolerated the procedure  well.  There were no immediate complications.  The dilator and Wholey  wire were advanced back into the sheath which was pulled back into the  right external iliac artery and a right external iliac artery angiogram  with runoff to the foot was performed.  The patient was transferred to  the holding area in stable condition.   FINDINGS:  Abdominal aorta.  The abdominal aorta is diffusely diseased  with no focal stenoses.  There is moderate calcification.  There are  single bilateral renal arteries both of which are widely patent.  On  the  left, the common iliac artery is patent.  There is a stent at the origin  of the common iliac that is patent with mild restenosis.  The internal  iliac is diseased but patent.  The left external iliac artery only has  mild disease without significant stenosis.  Common femoral artery is  patent.  The profunda is patent.  The SFA is patent until the mid vessel  where the large collateral arises and just beyond the collateral there  is a severe 90% focal stenosis.  There is then a stented segment and on  the distal edge of the stent there is a focal 50-70% stenosis present.  The remaining portions of the distal SFA and popliteal artery are  patent.  The anterior tibial, posterior tibial, and peroneal arteries  are all patent.  The main runoff to the foot is via the posterior tibial  and anterior tibial arteries.  On the right side, the common internal  and external iliac arteries are patent.  The right common femoral is  patent.  The SFA and deep femoral arteries are patent.  The popliteal  artery is patent and the infragenicular vessels are all patent.  There  is mild plaque throughout the SFA, but no significant obstructive  lesions are present.  The midportion of the anterior tibial artery has a  focal 50% stenosis.   ASSESSMENT:  1. Severe left superficial femoral artery stenosis and moderate      superficial femoral artery in-stent restenosis, successfully      treated with percutaneous transluminal angioplasty.  2. Nonobstructive diffuse disease involving the right leg.  3. Patent iliac stents.   RECOMMENDATIONS:  [name] will continue with aspirin.  He is  allergic to PLAVIX.  I am going to treat him with 4-6 weeks of prasugrel  in the setting of his ASPIRIN allergy, since this has a better risk  profile than ticlopidine.  I would like to follow him up with lower  extremity duplex to reevaluate his left leg and reestablish his baseline  in the next 2-4 weeks.       Juanda Bond. Burt Knack, MD  Electronically Signed     MDC/MEDQ  D:  10/22/2008  T:  10/22/2008  Job:  [id number]   cc:   Abbe Amsterdam

## 2011-04-19 NOTE — Assessment & Plan Note (Signed)
[facility]                            CARDIOLOGY OFFICE NOTE   NAME:Beam, KASTIEL GADBOIS                       MRN:          [id number]  DATE:01/09/2009                            DOB:          27-Jul-1946    REASON FOR VISIT:  Lower extremity edema.   HISTORY OF PRESENT ILLNESS:  Mr. [name] is a 65 year old gentleman with  lower extremity peripheral arterial disease.  He has undergone stenting  of his iliac and superficial femoral arteries in the past.  He recently  underwent balloon angioplasty of his left superficial femoral artery in  November 2009.  He continues to have left leg pain mostly related to a  left knee problem.  He is going to have left knee replacement by Dr.  Noemi Chapel in the near future.  He denies any calf claudication symptoms at  present.  His main concern is that he has developed swelling of his  lower legs and feet over the last few months.  He also describes some  exertional dyspnea that has been slowly progressive.  He denies  orthopnea, PND, or cough.  He has had no chest pain or pressure.  He has  no other complaints at present.   CURRENT MEDICATIONS:  1. Atenolol/chlorthalidone 50/25 mg daily.  2. Crestor 10 mg daily.  3. Aspirin 325 mg daily.  4. Amlodipine 5 mg daily.  5. Triamterene HCTZ 75/50 mg daily.   ALLERGIES:  PLAVIX causes rash.   PHYSICAL EXAMINATION:  GENERAL:  The patient is alert and oriented.  He  is in no acute distress.  VITAL SIGNS:  Weight is 233 pounds, blood pressure 120/66, heart rate  68, and respiratory rate is 12.  HEENT:  Normal.  NECK:  Normal carotid upstrokes.  No bruits.  JVP normal.  LUNGS:  Clear bilaterally.  HEART:  Regular rate and rhythm.  No murmurs or gallops.  ABDOMEN:  Soft, nontender.  No organomegaly.  EXTREMITIES:  There is 1+ edema in the ankles and feet bilaterally.  Peripheral pulses are intact and equal.  SKIN:  Warm and dry.   ASSESSMENT:  1. Lower extremity  peripheral arterial disease.  Remains minimally      symptomatic.  Postprocedure ankle-brachial index on December 9,      were 0.77 on the right and 0.86 on the left.  Continue current      medical therapy.  2. Edema.  This correlates with his initiation of amlodipine in      November.  I suspect this is a side effect of amlodipine rather      than a symptom of congestive heart failure.  His physical exam is      otherwise normal.  Recommend discontinue amlodipine and start      lisinopril 10 mg daily.  Metabolic panel will be checked in 2-3      weeks.  He was written a prescription to have his blood work drawn      near home, so that he does not have to commute back to [place].  3. Exertional dyspnea.  In the presence of  peripheral arterial disease      and high coronary artery risk, we will check an adenosine Myoview      stress scan to rule out ischemia.  This will also give assessment      of his left ventricular function.  Further plans pending the      results of his stress test.  4. Hypertension.  As above, changed amlodipine to lisinopril today.      Recommend continue atenolol/chlorthalidone.  We would favor      discontinuation of triamterene/hydrochlorothiazide as well.  The      diuretic was started to treat his edema and I am hopeful that this      will resolve once the amlodipine is discontinued.   For followup, I would like to see Mr. Wohlfarth in 3 weeks.  If his stress  test is low risk, he can proceed with his upcoming total knee  replacement without further cardiac evaluation.     Juanda Bond. Burt Knack, MD  Electronically Signed    MDC/MedQ  DD: 01/09/2009  DT: 01/10/2009  Job #: [id number]   cc:   Abbe Amsterdam

## 2011-04-19 NOTE — Op Note (Signed)
NAME:  [name], SASS NO.:  [id number]   MEDICAL RECORD NO.:  [id number]          PATIENT TYPE:  INP   LOCATION:  5004                         FACILITY:  [facility]   PHYSICIAN:  Audree Camel. Noemi Chapel, M.D. DATE OF BIRTH:  1946-12-05   DATE OF PROCEDURE:  02/23/2009  DATE OF DISCHARGE:                               OPERATIVE REPORT   PREOPERATIVE DIAGNOSIS:  Left knee degenerative joint disease.   POSTOPERATIVE DIAGNOSIS:  Left knee degenerative joint disease.   PROCEDURE:  Left total knee replacement using DePuy cemented total knee  system with #5 cemented femur, #5 cemented tibia with 15-mm polyethylene  RP tibial spacer, and 38-mm polyethylene cemented patella.   SURGEON:  Audree Camel. Noemi Chapel, MD   ASSISTANT:  Matthew Saras, PA   ANESTHESIA:  General.   OPERATIVE TIME:  One hour and 20 minutes.   ESTIMATED BLOOD LOSS:  150 mL.   COMPLICATIONS:  None.   DESCRIPTION OF PROCEDURE:  Mr. Guadagnoli was brought to the operating room  on February 23, 2009, placed on operating table in a supine position.  He  received Ancef 2 g IV preoperatively for prophylaxis.  After being  placed under general anesthesia, he had his left knee sterilely injected  with 30 mL of 0.25% Marcaine with epinephrine.  He had a Foley catheter  placed under sterile condition.  His left leg was examined.  The range  of motion from -5 to 125 degrees, moderate varus deformity, and a stable  ligamentous exam with normal patellar tracking.  Left leg was then  prepped using sterile DuraPrep and draped using sterile technique.  The  tourniquet was not used in this case because of the patient's previous  vascular stent placement in his femoral artery and so local bupivacaine  was used.  At this point, a 15-cm longitudinal incision was made over  the patella.  The underlying subcutaneous tissues were incised along the  skin incision.  A median arthrotomy was performed revealing an excessive  amount of  normal-appearing joint fluid.  A small synovial bleeders were  cauterized.  The articular surfaces were inspected.  He had grade 4  changes medially, grade 3 changes laterally, and grade 3 and 4 changes  in the patellofemoral joint.  Osteophytes removed from the femoral  condyles and tibial plateau.  The medial and lateral meniscal remnants  were removed as well as the anterior cruciate ligament.  An  intramedullary drill was then drilled up the femoral canal for placement  of distal femoral cutting jig, which was placed in the appropriate  manner by rotation and a distal 11-mm cut was made.  The distal femur  was then sized.  A #5 was found to be the appropriate size.  A #5  cutting jig was placed in the appropriate amount of external rotation  and then these cuts were made.  A proximal tibia was then exposed.  Tibial spines were removed with an oscillating saw.  An intramedullary  drill was drilled down the tibial canal for placement of proximal tibial  cutting jig, which was placed in the  appropriate amount of rotation and  a proximal 6-mm cut was made, based off the medial or lower side.  Spacer blocks were then placed in flexion and extension.  A #15 block  gave excellent stability, excellent balancing and excellent correction  of his flexion and varus deformities.  At this point, the #5 tibial base  plate trial was placed on the cut tibial surface with an excellent fit  and a keel cut was made.  The PCL box cutter was then placed on the  distal femur and these cuts were made.  At this point, the #5 femoral  trial was placed with the #5 tibial base plate trial and a [id number]  polyethylene RP tibial spacer, and the knee was reduced, taken through a  range of motion from 0 to 125 degrees with excellent stability and  excellent correction of his flexion and varus deformities and normal  patellar tracking.  A resurfacing 10-mm cut was then made on the patella  and 3 locking holes placed for  a 38-mm patella.  The patella trial was  placed and again patellofemoral tracking was evaluated and found to be  normal.  At this point, it was felt that all the trial components were  of excellent size, fit, and stability.  They were then removed.  The  knee was then jet lavage irrigated with 3 liters of saline.  Epinephrine  and saline-soaked sponges were used to keep the bone surfaces clean and  then the tibial base plate with cement backing was hammered into  position with an excellent fit with excess cement being removed from  around the edges.  The #5 femoral component with cement backing was  hammered into position and also with an excellent fit with excess cement  being removed from around the edges.  The 15-mm polyethylene tibial  spacer was placed on the tibial baseplate.  The knee was reduced, taken  through a range of motion from 0 to 125 degrees with excellent stability  and excellent correction of his flexion and varus deformities.  The 38-  mm polyethylene cement backed patella was then placed in its position  and held there with a clamp.  After the cement hardened, again the  patellofemoral tracking was evaluated and found to be normal.  At this  point, it was felt that all the components were of excellent size, fit,  and stability.  The wound was further irrigated with saline and then  thrombin spray was also used to help small synovial hemostasis.  The  arthrotomy was then closed with #1 Ethibond suture over 2 medium Hemovac  drains.  Subcutaneous tissues were closed with 0 and 2-0 Vicryl,  subcuticular layer closed with 4-0 Monocryl.  Sterile dressings and a  long leg splint applied.  The patient was then awakened, extubated, and  taken to recovery room in stable condition.  Needle and sponge counts  were correct x2 at the end of the case.  Neurovascular status, normal  pulses, 1+ and symmetric.  He was then taken to the recovery room in  stable condition.       Robert A. Noemi Chapel, M.D.  Electronically Signed     RAW/MEDQ  D:  02/23/2009  T:  02/24/2009  Job:  [id number]

## 2011-04-19 NOTE — Assessment & Plan Note (Signed)
OFFICE VISIT   MACKLIN, EINSTEIN  DOB:  August 23, 1946                                       07/11/2007  [id number]   Mr. Kasal returns for followup today after left carotid endarterectomy  in July 2007.  Since that time he has had no recurrent symptoms of TIA,  stroke, or amaurosis.  He continues to take his aspirin daily.  He is  smoking approximately half a pack of cigarettes per day.  His other  atherosclerotic risk factors including hypertension and elevated  cholesterol.   On physical exam, blood pressure is 138/86 in the left arm, 141/89 in  the right arm.  Heart rate is 57 and regular.  HEENT is unremarkable.  He has 2+ carotid pulses without bruit.  There is a well-healed left  neck scar.  There is still some mild numbness underneath the left jaw  and around the incision.  Upper extremity and lower extremity motor exam  is 5/5 bilaterally.  He has 2+ radial and 2+ femoral pulses bilaterally.  Abdomen is soft and nontender.  Carotid duplex today showed no  significant recurrent stenosis in the left side.  He has a very mild  stenosis in the right side.   Overall, Mr. Shirkey is doing well.  He also has a history of bilateral  lower extremity claudication which was followed by Dr. Albertine Patricia in the  past.  He is still being followed at [facility] for this.  Overall, his symptoms are stable.  He states he also has some back  problems that may be contributing to this.   Mr. Solomons is doing well overall.  He will follow up in 1 year with a  carotid duplex scan and will be on a carotid protocol thereafter.  He  will continue to take his aspirin.  I wrote him a  prescription for Chantix today to try to get him to quit smoking once  again.  If he has any recurrent symptoms he will return for sooner  followup.   Jessy Oto. Fields, MD  Electronically Signed   CEF/MEDQ  D:  07/11/2007  T:  07/12/2007  Job:  [id number]   cc:   [facility]  chart

## 2011-04-19 NOTE — Discharge Summary (Signed)
NAMEWENDALL, EHRENFELD NO.:  [id number]   MEDICAL RECORD NO.:  [id number]          PATIENT TYPE:  INP   LOCATION:  3111                         FACILITY:  [facility]   PHYSICIAN:  Leeroy Cha, M.D.   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   CLINICAL HISTORY:  The patient was admitted because of back pain but  mostly pain with walking.  The patient in the past about 30 years ago  underwent lumbar decompression.  He had a vascular workup that was  negative.  X-ray showed that he has a severe stenosis.  Because of the  findings, surgery was advised.  Laboratory normal.   COURSE IN THE HOSPITAL:  The patient was taken to surgery, and  decompressive laminectomy from L3 to L5-S1 with foraminotomy was done.  The patient was found to have quite a bit of adhesions with  calcification of the dura mater. We found CSF leak.  Then we did a  posterolateral arthrodesis with BMP and autograft.  The patient was kept  in the ICU with a lumbar catheter.  This was removed 48 hours ago.  Today he right now has no pain, no weakness, no headache.  He is ready  to go home.   CONDITION ON DISCHARGE:  Improvement.   MEDICATIONS:  Percocet, diazepam, Lyrica.   DIET:  Regular.   ACTIVITY:  Not to drive until I see him.   FOLLOWUP:  To be seen by me in 10 days or as needed.           ______________________________  Leeroy Cha, M.D.     EB/MEDQ  D:  01/02/2008  T:  01/02/2008  Job:  [id number]

## 2011-04-19 NOTE — Progress Notes (Signed)
[facility]                        PERIPHERAL VASCULAR OFFICE NOTE   NAME:JUSTICEAvner, Marchi                         MRN:          [id number]  DATE:05/24/2007                            DOB:          1946-02-02    PRIMARY CARE PHYSICIAN:  Meda Klinefelter in [place].   HISTORY OF PRESENT ILLNESS:  Mr. Sooy is a 65 year old gentleman with  long standing peripheral arterial disease. He is status post left common  iliac artery and left SFA stenting and right SFA stenting. Most recent  procedures were in April of this year, when he had bilateral SFA  stenting. His claudication is completely resolved. ABI's performed Apr 12, 2007 are 1.0 on the right and 0.97 on the left.   Mr. Ashmead is, however, troubled by numbness, which radiates from his  knees down, occurring primarily with standing. He finds this to be quite  disabling. It does not happen when he is sitting. He has also noticed  some numbness in both arms when awakened from sleeping. He is quite  concerned by these.   In addition, he has noticed a lump in the posterior aspect of his right  neck. He has not had any fevers, chills, night sweats, or weight loss.   CURRENT MEDICATIONS:  Atenolol/chlorthalidone 50/25 1 daily, Crestor 10  mg daily, aspirin 325 mg daily, Ticlid 250 mg once daily, and Chantix 1  mg once daily.   PHYSICAL EXAMINATION:  GENERAL:  Well appearing. No distress.  VITAL SIGNS:  Heart rate 60, blood pressure 136/72 on the left and  140/74 on the right. Weight 227 pounds.  HEENT:  No jugular venous distention, thyromegaly, or lymphadenopathy.  LUNGS:  Clear to auscultation. He has a non-displaced point of maximal  cardiac impulse.  HEART:  Regular rate and rhythm. Without murmur, rub, or gallop.  ABDOMEN:  Soft, nontender, and nondistended. There is no  hepatosplenomegaly. Normal bowel sounds. No pulsatile midline mass.  EXTREMITIES:  Warm without clubbing, cyanosis, edema, or  ulceration.  Sensation intact to light touch in all 4 extremities. Strength is normal  in all 4 extremities. There are no fasciculations. Carotid pulses 2+  bilaterally without bruit. Bilateral DP pulses are 1+.  NEUROLOGIC:  Alert and oriented times three.  SKIN:  Normal examination. He has what appears to be an enlarged lymph  node in his right posterior neck. Thee is no other cervical,  supraclavicular, or axillary lymphadenopathy.   IMPRESSION/RECOMMENDATIONS:  1. Enlarged lymph node. I asked him to followup with Dr. Carlynn Spry in      the near future. He has no worrisome symptoms.  2. Nausea. Likely due to the Chantix. We discussed stopping it but he      wishes to remain on it, at least once daily. He is happy that he      has been able to cut back on his cigarettes quite successfully. I      congratulated him on this.  3. Cerebrovascular disease. Doing well after left carotid      endarterectomy.  4. Hypercholesterolemia. Managed by Dr. Carlynn Spry. Goal LDL less  than      70.  5. Lower extremity vascular disease. Now asymptomatic. Status post      bilateral SFA stenting. Can discontinue the Ticlid. Continue      aspirin.  6. Numbness in his legs. The patient is quite concerned about this and      in fact, requires as to whether this could be Leverne Humbles disease.      I provided some reassurance. Will schedule him for consultation      with Dr. Katherina Right of Neurology.     Ethelle Lyon, MD  Electronically Signed    WED/MedQ  DD: 05/24/2007  DT: 05/24/2007  Job #: [id number]   cc:   Abbe Amsterdam

## 2011-04-19 NOTE — Op Note (Signed)
NAMEKARTHIKEYA, CORTEZ NO.:  [id number]   MEDICAL RECORD NO.:  [id number]          PATIENT TYPE:  INP   LOCATION:  2899                         FACILITY:  [facility]   PHYSICIAN:  Leeroy Cha, M.D.   DATE OF BIRTH:  [birth year]   DATE OF PROCEDURE:  12/28/2007  DATE OF DISCHARGE:                               OPERATIVE REPORT   PREOPERATIVE DIAGNOSES:  Recurrent L3-L4, L4-L5 stenosis with foraminal  stenosis.  Neurogenic claudication.  Status post previous laminectomy  about 12 years ago.   POSTOPERATIVE DIAGNOSES:  Recurrent L3-L4, L4-L5 stenosis with foraminal  stenosis.  Neurogenic claudication.  Status post previous laminectomy  about 12 years ago.   PROCEDURE:  Bilateral 3-4-5 laminectomy.  Lysis of several adhesions.  Decompression of the dural sac.  Foraminotomy to decompress the L3 to L5-  S1 nerve root.  Posterolateral arthrodesis with autograft and BMP.  Intrathecal catheter for CSF drain.  Microscope.   SURGEON:  Leeroy Cha, M.D.   ASSISTANT:  Ophelia Charter, M.D.   Mr. Fasig is a 65 year old gentleman who about 20 years ago underwent  posterior decompression of the lumbar spine.  The patient came back  telling me that the pain is getting worse.  The pain gets worse with  walking, better with resting.  He has had vascular study which was  negative.  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  Surgery was advised including  possibility that we might need pedicle screws.  The risks were  __________ weakness, paralysis, CSF leak, infection.   PROCEDURE:  The patient was taken to the OR and he was positioned in a  prone manner.  The skin was cleaned with DuraPrep.  Drapes were applied.  Midline incision from the L2 spinal process down to L5-S1 was made.  Muscles were retracted laterally.  We were able to visualize and  feel  the transverse process of 3-4-5.  From then on the difficult part was to  get into the dural sac.  The patient has quite a bit of overgrowth of  the facet.  The lamina were already closed in the midline.  We drilled  the lamina of 3-4-5 and then we had to bring the microscope into the  area.  The patient had quite a bit of adhesion and calcification of the  ligament.  This was calcified all the way down to the dura mater.  We  started at the level of the lower part of L3 looking for previous area  of yellow ligament.  We found it right underneath the lamina of L2.  From then on we worked our dissection down using the microcurette, the 1  mm Kerrison punch, the drill.  Nevertheless, although we were able to  achieve decompression after 2 hours, we found some opening in the dura  mater.  The worst part was at the level of 4-5, mostly on the right side  where the nerve was completely glued to the  floor.  To decompress the L5  nerve root on S1, we had to use a 1 mm Kerrison punch until we were able  to open the canal.  Nevertheless, we had 2 areas open in the dura mater  and because of that we went ahead and used Duragen on top of the  Tisseel.  Then we went laterally.  We drilled the lateral aspect of the  facet and the transverse process of 3-4-5 and the ala of the sacrum.  Then a mix of bone graft as well as BMP was used for arthrodesis.  Although we accomplished a good __________, nevertheless I was concerned  about this, and  we introduced a catheter using spinal needle right at L2 and the  catheter was threaded about 10 cm above that area.  CSF which was clear  came back.  It was brought through a different incision.  From then on  the area was irrigated.  The wound was closed with Vicryl and nylon.  The patient is going to go to the step-down unit.           ______________________________  Leeroy Cha, M.D.     EB/MEDQ  D:  12/28/2007  T:  12/28/2007  Job:  [id number]

## 2011-04-19 NOTE — Procedures (Signed)
CAROTID DUPLEX EXAM   INDICATION:  Carotid disease.   HISTORY:  Diabetes:  No.  Cardiac:  No.  Hypertension:  Yes.  Smoking:  Yes.  Previous Surgery:  Left carotid endarterectomy on 06/22/2006.  CV History:  Currently asymptomatic.  Amaurosis Fugax No, Paresthesias No, Hemiparesis No                                       RIGHT             LEFT  Brachial systolic pressure:         182               164  Brachial Doppler waveforms:         Normal            Normal  Vertebral direction of flow:        Antegrade         Antegrade  DUPLEX VELOCITIES (cm/sec)  CCA peak systolic                   88                0000000  ECA peak systolic                   151               [id number]  ICA peak systolic                   73                [id number]  ICA end diastolic                   20                50  PLAQUE MORPHOLOGY:                  Heterogeneous     Homogeneous  PLAQUE AMOUNT:                      Mild              Moderate  PLAQUE LOCATION:                    ICA / ECA         ICA / CCA   IMPRESSION:  1. 1%-39% stenosis of the right internal carotid artery.  2. Patent left carotid endarterectomy site with Doppler velocities      suggestive of low end 60%-79% stenosis of the left internal carotid      artery.  3. No significant change noted when compared to the previous exam on      07/15/2009.   ___________________________________________  Jessy Oto. Fields, MD   CH/MEDQ  D:  01/08/2010  T:  01/08/2010  Job:  [id number]

## 2011-04-19 NOTE — Procedures (Signed)
CAROTID DUPLEX EXAM   INDICATION:  Followup evaluation for known.   HISTORY:  Diabetes: No  Cardiac:  No  Hypertension:  Yes  Smoking:  Pack per day for 40 years.  Previous Surgery:  Left carotid endarterectomy with Dacron patch  angioplasty on June 22, 2006, by Dr. Oneida Alar.  CV History:  Patient had a TIA prior to endarterectomy.  Amaurosis Fugax No, Paresthesias No, Hemiparesis No                                       RIGHT             LEFT  Brachial systolic pressure:         126               128  Brachial Doppler waveforms:         Triphasic.        Triphasic.  Vertebral direction of flow:        Antegrade.        Antegrade.  DUPLEX VELOCITIES (cm/sec)  CCA peak systolic                   84                91  ECA peak systolic                   114               [id number]  ICA peak systolic                   49                70  ICA end diastolic                   14                26  PLAQUE MORPHOLOGY:                  Mixed             Mixed  PLAQUE AMOUNT:                      Mild              Mild  PLAQUE LOCATION:                    Proximal ICA      Proximal ICA   IMPRESSION:  1. 20% to 39% right ICA stenosis.  2. 20% to 39% left ICA stenosis, status post endarterectomy.   ___________________________________________  Jessy Oto Oneida Alar, MD   MC/MEDQ  D:  07/11/2007  T:  07/12/2007  Job:  [id number]

## 2011-04-19 NOTE — Progress Notes (Signed)
[facility]                        PERIPHERAL VASCULAR OFFICE NOTE   NAME:[name] [name]                         MRN:          [id number]  DATE:10/17/2008                            DOB:          12/14/1945    REASON FOR VISIT:  Lower extremity PAD.   HISTORY OF PRESENT ILLNESS:  [name] [name] is a 65 year old gentleman with  lower extremity peripheral arterial disease and intermittent  claudication.  He has undergone several endovascular procedures in both  legs for treatment of arterial occlusive disease.  He has had iliac and  superficial femoral artery stenting performed bilaterally.  He presents  today for follow-up.  He is having persistent problems of leg pain and  weakness.  He feels like his legs give out.  He is also having  difficulty with the left knee and apparently needs knee surgery because  of severe degenerative arthritis.  He underwent back surgery by Dr.  Joya Salm earlier this year and that improved some of the problems he was  had with right leg pain and numbness.  He still has bilateral calf  claudication, left greater than right and a feeling of weak legs.  He  has no rest pain in the legs.  He denies skin problems or nonhealing  ulcers.   MEDICATIONS:  Include atenolol/chlorthalidone 50/25 mg daily, Crestor 10  mg daily, aspirin 325 mg daily and amlodipine 5 mg daily.   ALLERGIES:  He is allergic to Plavix which causes a rash.   PHYSICAL EXAMINATION:  GENERAL:  He is alert and oriented.  He is in no  acute distress.  VITAL SIGNS:  Weight is 229 pounds, blood pressure 150/76 on the right  160/84 the left, heart rate 68, respiratory rate 16.  HEENT:  Normal.  NECK:  Normal carotid upstrokes, no bruits.  JVP is normal.  LUNGS:  Clear bilaterally.  HEART:  Regular rate and rhythm.  No murmurs, gallops.  ABDOMEN:  Soft, nontender, no bruits.  No organomegaly.  EXTREMITIES:  Femoral pulses are 2+.  There is a right femoral bruit.  DP and PT pulses are 2+ on the right and are not palpable on the left.   STUDIES:  His last lower extremity duplex scan was in December 2008.  At  that time, he had an ABI of 0.84 on the right and 0.53 on the left.  There was severe stenosis in the distal SFA on the left.   ASSESSMENT:  [name] [name] is a 65 year old gentleman with lower extremity  PAD as outlined above.  He continues to have symptoms of lifestyle-  limiting intermittent claudication.  He has known severe left SFA  stenosis.  His left ABI was in the range of 0.5 last year.  He has  recovered from back surgery and this seems to be a good time to proceed  with further evaluation of his lower extremity arterial disease.  I have  reviewed the risks and indications of a lower extremity angiogram with  plans toward PTA and he is agreeable to proceed.  If his anatomy is  amenable to percutaneous intervention.  He will require a 4-6-week  course of ticlopidine in addition to aspirin since he has a Plavix  allergy.  Another consideration would be the use of prasugrel since it  has a better side effect profile compared with ticlopidine.  Will will  make further plans and recommendations based on his anatomy at the time  of his lower extremity arteriogram.  Will plan on accessing the right  femoral artery with a plan toward intervening on the left leg since that  is the most severely effected leg by his noninvasive studies.     Juanda Bond. Burt Knack, MD  Electronically Signed    MDC/MedQ  DD: 10/20/2008  DT: 10/20/2008  Job #: [id number]   cc:   Abbe Amsterdam

## 2011-04-19 NOTE — Procedures (Signed)
CAROTID DUPLEX EXAM   INDICATION:  Follow up of known carotid artery disease.  Patient is  currently asymptomatic.   HISTORY:  Diabetes:  No.  Cardiac:  No.  Hypertension:  Yes.  Smoking:  Yes, one pack per day.  Previous Surgery:  Left CEA with DPA on 06/22/06 by Dr. Oneida Alar.  Bilateral lower extremity stents.  CV History:  TIA prior to endarterectomy.  Amaurosis Fugax No, Paresthesias No, Hemiparesis No.                                       RIGHT             LEFT  Brachial systolic pressure:         134               132  Brachial Doppler waveforms:         Triphasic         Triphasic  Vertebral direction of flow:        Antegrade         Antegrade  DUPLEX VELOCITIES (cm/sec)  CCA peak systolic                   88                89  ECA peak systolic                   125               [id number]  ICA peak systolic                   94                84  ICA end diastolic                   19                26  PLAQUE MORPHOLOGY:                  Mixed             Mixed  PLAQUE AMOUNT:                      Minimal           Minimal  PLAQUE LOCATION:                    DCCA, ICA, ECA    Bifurcation, ICA   IMPRESSION:  1. Right 1-39% internal carotid artery stenosis.  2. Left internal carotid artery without recurrent stenosis, status      post carotid endarterectomy.  3. Study essentially unchanged from that on 07/11/07.   ___________________________________________  Jessy Oto. Fields, MD   PB/MEDQ  D:  07/16/2008  T:  07/16/2008  Job:  [id number]

## 2011-04-19 NOTE — Procedures (Signed)
CAROTID DUPLEX EXAM   INDICATION:  Carotid stenosis   HISTORY:  Diabetes:  No.  Cardiac:  No.  Hypertension:  Yes.  Smoking:  Yes.  Previous Surgery:  Left carotid endarterectomy on 06/22/2006.  CV History:  Currently asymptomatic.  Amaurosis Fugax No, Paresthesias No, Hemiparesis No                                       RIGHT             LEFT  Brachial systolic pressure:         172               170  Brachial Doppler waveforms:         Normal            Normal  Vertebral direction of flow:        Antegrade         Antegrade  DUPLEX VELOCITIES (cm/sec)  CCA peak systolic                   92                [id number]  ECA peak systolic                   114               [id number]  ICA peak systolic                   74                [id number]  ICA end diastolic                   18                48  PLAQUE MORPHOLOGY:                  Heterogeneous     Heterogeneous  PLAQUE AMOUNT:                      Mild              Severe  PLAQUE LOCATION:                    ICA / ECA         Distal CCA   IMPRESSION:  1. No hemodynamically significant stenosis of the right proximal      internal carotid artery with mild plaque formations noted, as      described above.  2. Doppler velocity suggests a 40%-59% stenosis of the left proximal      internal carotid artery, however, this appears to be mostly due to      a change in vessel diameter.  3. There is a hemodynamically significant stenosis of the left distal      common carotid artery at the proximal endarterectomy patch level.  4. Significantly elevated velocity of the left distal common carotid      artery noted when compared to the previous exam on 01/08/2010 with      the right carotid system remaining stable.   ___________________________________________  Jessy Oto. Fields, MD   CH/MEDQ  D:  08/06/2010  T:  08/06/2010  Job:  [id number]

## 2011-04-19 NOTE — Progress Notes (Signed)
[facility]                        PERIPHERAL VASCULAR OFFICE NOTE   NAME:Holzschuh, STEIN                         MRN:          [id number]  DATE:11/20/2007                            DOB:          Oct 08, 1946    HISTORY:  Rayshawn Rosato was seen in followup at the [facility] on November 20, 2007.  Mr. Shadwell is a 65 year old  gentleman with lower extremity peripheral arterial disease who has been  previously followed by Dr. Albertine Patricia.  Mr. Swierk has undergone multiple  revascularization procedures with stents in the external iliacs as well  as the left common iliac artery.  He has also had stenting of both  superficial femoral arteries.  His most recent procedures in the  superficial femorals were performed in April of this year.  Mr. Tristan  continues to have problems with his legs.  He does not have typical  claudication symptoms, but he does complain of numbness down the right  leg with standing and walking.  He also has hip pain, pain into the  thighs and both legs.  He has not had much calf claudication.  He has  seen Dr. Joya Salm and apparently has a problem with herniated disks and is  going to require a third back surgery.  Mr. Matsuyama has a difficult time  distinguishing whether his leg pain is secondary to his lumbar back  disease versus his vascular disease.   CURRENT MEDICATIONS:  1. Potassium 10 mEq daily.  2. Atenolol/chlorthalidone 50/25 mg daily.  3. Crestor 10 mg daily.  4. Aspirin 325 mg daily.   ALLERGIES:  PLAVIX causes rash.   PHYSICAL EXAMINATION:  GENERAL:  The patient is alert and oriented in no  acute distress.  VITAL SIGNS:  Weight 229, blood pressure 126/80, heart rate 65,  respiratory rate 16.  NECK:  Normal carotid upstrokes without bruits.  There is a left carotid  endarterectomy scar present.  Jugular venous pressure is normal.  LUNGS:  Clear to auscultation bilaterally.  HEART:  Regular rate  and rhythm without murmurs or gallops.  ABDOMEN:  Soft, nontender.  No organomegaly.  No abdominal bruits.  EXTREMITIES:  No clubbing, cyanosis, or edema.  Femoral pulses are 2+  and equal with a right femoral bruit.  Dorsalis pedis and posterior  pedal pulses are 2+ on the right and 1+ on the left.   ASSESSMENT:  Mr. Overholt appears stable with regard to his lower  extremity peripheral arterial disease.  He has strong femoral pulses and  easily palpable pulses in his feet as well.  I agree with Dr. Joya Salm  that his symptoms are likely secondary to his back problem and unrelated  to his vascular disease.  We have scheduled him for a lower extremity  duplex scan and ABIs later today to confirm patency of his stents.  I  would like to see Mr. Banken back on a yearly basis with ABIs to  continue to monitor his lower extremity peripheral arterial disease.  He  plans on  having his back surgery in the early part of January.  If there are any  significant abnormalities on his lower extremity duplex scan, I will be  in touch with him.     Juanda Bond. Burt Knack, MD  Electronically Signed    MDC/MedQ  DD: 11/20/2007  DT: 11/20/2007  Job #: [id number]   cc:   Leeroy Cha, M.D.  Abbe [place]

## 2011-04-19 NOTE — Assessment & Plan Note (Signed)
OFFICE VISIT   [name], [name]  DOB:  1946-02-16                                       09/16/2010  [id number]   Patient underwent successful left carotid stenting on 09/10/2010.  He  was discharged home after the procedure without difficulty.  We had  stopped his ACE inhibitor briefly because of an elevated creatinine.  This may need further evaluation in the future but will leave this at  the discretion of his primary care doctor.  He will follow up with [place] in  a few weeks for repeat carotid duplex scan.     Jessy Oto. Fields, MD  Electronically Signed   CEF/MEDQ  D:  09/16/2010  T:  09/16/2010  Job:  [id number]   cc:   Abbe Amsterdam

## 2011-04-22 NOTE — Op Note (Signed)
NAME:  [name], [name] NO.:  [id number]   MEDICAL RECORD NO.:  [id number]          PATIENT TYPE:  INP   LOCATION:  3301                         FACILITY:  [facility]   PHYSICIAN:  Jessy Oto. Fields, MD  DATE OF BIRTH:  1946/03/16   DATE OF PROCEDURE:  06/22/2006  DATE OF DISCHARGE:  06/23/2006                                 OPERATIVE REPORT   PROCEDURE:  Left carotid endarterectomy.   PREOPERATIVE DIAGNOSIS:  Symptomatic left internal carotid artery stenosis.   POSTOPERATIVE DIAGNOSIS:  Symptomatic left internal carotid artery stenosis.   ANESTHESIA:  General.   ASSISTANT:  Suzzanne Cloud, PA.   INDICATIONS:  The patient is a 65 year old male who is status post left  brain stroke which manifested itself as slurred speech.  He had previous  carotid arteriogram which shows greater than 80% stenosis of the left  internal carotid artery.   OPERATIVE FINDINGS:  1.  Greater than 80% stenosis left internal carotid artery.  2.  Dacron patch.  3.  10-French shunt.   DESCRIPTION OF PROCEDURE:  After obtaining informed consent, the patient was  taken to the operating.  The patient was placed in supine position on the  operating room table.  After induction of general anesthesia and  endotracheal intubation, the patient's entire left neck and chest were  prepped and draped in the usual sterile fashion.  An oblique incision was  made just anterior to the border of the left sternocleidomastoid muscle.  The incision was carried down through the subcutaneous tissues down through  the platysma and the sternocleidomastoid muscle was retracted laterally.  The left internal jugular vein was retracted laterally.  The common facial  vein was dissected free circumferentially and ligated and divided between  silk ties.  The common carotid artery was then dissected free  circumferentially.  The vagus nerve was identified and protected from harm's  way.  The ansa cervicalis was  identified.  This was followed up to the level  of the hypoglossal nerve.  Hypoglossal nerve was not disturbed and was  protected.  The internal carotid, superior thyroid and external carotid  arteries were dissected free circumferentially.  The patient was then given  7000 units of intravenous heparin.  The distal internal carotid artery was  clamped with a fine bulldog clamp.  The external carotid was controlled with  vessel loops and the common carotid artery controlled with a peripheral  DeBakey clamp.  A longitudinal arteriotomy was made just at the level of the  carotid bifurcation.  The arteriotomy was extended with Potts scissors up  through the level of the stenosis which was calcified and greater than 80%.  The incision was carried up through the point of stenosis to a more suitable  normal portion of internal carotid artery.  Next, a 10-French shunt was  brought up on the operative field.  This was threaded into the distal  internal carotid artery and allowed to back bleed thoroughly.  It was then  threaded down into the proximal common carotid artery and controlled with a  Rumel tourniquet.  The shunt  was then opened after inspecting for air  bubbles and flow restored to the brain after approximately 4 minutes.  Next,  endarterectomy was begun at a suitable plane at the carotid bifurcation.  This was a fairly focal lesion but there was some atherosclerotic plaque  proximal and distal to this.  Eversion endarterectomy was performed in the  external carotid artery.  A suitable endpoint was obtained on the internal  carotid artery.  The common carotid artery also had a suitable endpoint.  The plaque was removed.  This was sent to pathology as a specimen.  Next a  Dacron patch was brought up in the operative field and sewn as a patch  angioplasty using a running 6-0 Prolene suture.  Just prior to completion of  the anastomosis, this was fore bled, backbled and thoroughly flushed.   The  anastomosis was secured, clamps were released first to the external carotid,  followed by the common carotid artery and then after approximately 5 cardiac  cycles to the internal carotid artery.  Doppler was then used to inspect the  carotid artery and there was good Doppler flow through the internal,  external and carotid common carotid arteries.  Hemostasis was obtained.  The  platysma muscle was reapproximated using a running 3-0 Vicryl suture.  The  skin was closed with a 4-0 Vicryl subcuticular stitch.  The patient  tolerated the procedure well and there were no complications.  Instrument,  sponge and needle count was correct at the end of the case.  The patient  taken to the recovery room in stable condition.  The patient was awakened in  the operating room and found to be neurologically intact with symmetric  upper extremity and lower extremity motor strength prior to transfer to the  recovery room.      Jessy Oto. Fields, MD  Electronically Signed     CEF/MEDQ  D:  06/23/2006  T:  06/24/2006  Job:  [id number]

## 2011-04-22 NOTE — Op Note (Signed)
NAME:  BUDDY, BERGREN NO.:  [id number]   MEDICAL RECORD NO.:  [id number]                   PATIENT TYPE:  OIB   LOCATION:  2899                                 FACILITY:  [facility]   PHYSICIAN:  Ethelle Lyon, M.D.             DATE OF BIRTH:  July 30, 1946   DATE OF PROCEDURE:  10/17/2003  DATE OF DISCHARGE:                                 OPERATIVE REPORT   PROCEDURE:  1. Abdominal  aortography.  2. Left external iliac angiography with runoff.  3. Right iliac angiography via the sheath with runoff.  4. Bilateral external iliac artery percutaneous transluminal angioplasty and     stenting.   INDICATIONS FOR PROCEDURE:  Mr. [name] is a 65 year old gentleman with  hypertension, dyslipidemia and ongoing tobacco use. He has bilateral lower  extremity claudication involving his hips, thighs and calves. The right is  slightly worse than the left. The pain begins reliably with exertion,  currently occurring at less than 2 blocks of level ground. It is relieved  with 2 to 3 minutes of rest. Ankle-brachial indices were 0.79 on the right  and 0.66  on the left with ultrasonic evidence of left iliac disease and  tandem lesions within the right SFA.   A CT angiogram was obtained for planning of access. This demonstrated a  severe stenosis of the midportion of the left external iliac with moderate  stenosis of the left common iliac. On the right there  was severe stenosis  of the proximal external iliac just  at the site of the internal takeoff.  The right SFA had 2 moderate stenoses. He is brought to the laboratory for  angiography  with an eye to percutaneous  intervention.   DESCRIPTION OF PROCEDURE:  Informed consent was obtained. Under  1%  Lidocaine local anesthesia, a 5 French sheath was placed in the right  femoral artery using the modified Seldinger technique. Anticoagulation was  initiated with heparin to achieve an  ACT of greater than 250 seconds.  A  pigtail catheter was advanced into the suprarenal abdominal aorta. Abdominal  aortography was performed by power injection. The pigtail catheter was then  pulled back to the distal abdominal aorta. Abdominal aortography with  imaging of the pelvic vessels was then performed.   Next an injection was made via the right common femoral sheath  with runoff  imaging of the right lower extremity using a step-table approach. Next a  LIMA catheter was advanced via the sheath and used to cross over into the  left common iliac artery using an angled glide wire. The sheath was advanced  into the common iliac artery and a Wholey wire was placed. The sheath and  catheter were removed over the wire and exchanged for a 6 French Terumo  sheath which was positioned in the proximal portion of the left external  iliac.   Angiography  was performed by  hand injection. This suggested that a 10-mm  balloon  may be need for appropriate sizing of angioplasty. Therefore the  sheath was removed over a wire and exchanged  for a 7 [place] sheath. The  left external iliac lesion was dilated using an 8 x 20 mm Powerflex at 10  atmospheres for 30 seconds. The patient experienced discomfort with this  inflation.   There was a suboptimal angiographic result. Therefore  the lesion was  stented using an 8 x 39 mm Genesis deployed at 8 atmospheres. This stent was  then post dilated using the  8 x 20 Powerflex at 8 atmospheres. Final angiography demonstrated no  residual stenosis and no dissection. The sheath was then removed over a wire  and exchanged for an 11-cm 8 French sheath in the right common femoral  artery.   Attention  was then turned  to the lesion  of the right external iliac. This  lesion was dilated using an 8 x 20 mm Powerflex at 6 atmospheres. This  produced a suboptimal angiographic result with approximately 50% residual  stenosis. There was a substantial size mismatch between the proximal normal   vessel (common iliac artery) and the distal  marginal lesion in the external  iliac. To conform to this size mismatch, a self-expanding stent was used.   A 10 x 40 mm Smart Stent was deployed across the lesion. The site of the  lesion was then post dilated  using the 8 x 20 mm Powerflex at 7  atmospheres. This inflation produced moderate pain. Final angiograms  demonstrated no residual  stenosis and no dissection.   The patient tolerated the procedure well and was transferred to the holding  room in stable condition. The sheath is to be removed when the ACT is less  than 175 seconds.   COMPLICATIONS:  None.   FINDINGS:  1. Diffuse mild disease of the abdominal aorta. There was no gradient on     pullback.  2. Single renal arteries  bilaterally. Both were normal.  3. Left leg:  50% common iliac stenosis at its origin. There was a 90%     stenosis of the external iliac which was treated with PTA and stent     resulting in no residual stenosis. The internal iliac is patent on the     left. There is mild plaquing of the common femoral artery, SFA. The     popliteal and profunda are normal. There is  3 vessel runoff to the foot.  4. Right leg:  Normal common and internal iliacs. There is a 70% stenosis of     the external iliac at its origin. This was associated with a 22-mm mean     gradient after the administration of intraarterial nitroglycerin. The     common femoral is normal. The profunda is normal. There is serial 30% and     50% stenosis at the midportion of the SFA. The popliteal is normal and     there is 3 vessel runoff to the foot.   IMPRESSION AND PLAN:  Successful angioplasty and subsequent stenting of  bilateral external iliac arteries with excellent angiographic and clinical  result. Bilateral dorsalis pedis pulses are 2+ after the completion of the  procedure. He will be continued  on Plavix for 30 days. Aspirin  will be continued indefinitely. Discharge is anticipated  in the morning.  Ethelle Lyon, M.D.    WED/MEDQ  D:  10/17/2003  T:  10/18/2003  Job:  [id number]   cc:   Dr. Annitta Jersey   Peripheral Vascular Lab

## 2011-04-22 NOTE — H&P (Signed)
NAME:  [name], [name] NO.:  [id number]   MEDICAL RECORD NO.:  [id number]          PATIENT TYPE:  INP   LOCATION:  NA                           FACILITY:  [facility]   PHYSICIAN:  [name] Oto. Fields, MD  DATE OF BIRTH:  March 30, 1946   DATE OF ADMISSION:  06/22/2006  DATE OF DISCHARGE:                                HISTORY & PHYSICAL   CHIEF COMPLAINT:  Left internal carotid artery stenosis.   HISTORY OF PRESENT ILLNESS:  [name] is a 65 year old Caucasian male  hospitalized on May 01, 2006, at [facility] after suffering an  acute CVA (left MCA per MRI).  Apparently, he had had three ocular migraines  that day while playing golf.  Later, he developed drooping of his right eye  and mouth which lasted only seconds.  This was associated with dizziness and  then followed by unintelligible speech.  He has had difficulty with his  speech for about 24 hours.  In the meantime, he was evaluated at [facility] for further workup.  Carotid Dopplers were done showing right internal  carotid artery stenosis less than 40%, but 60 to 99% stenosis on the left.  Vertebral flow was antegrade.  This was confirmed by cerebral arteriogram on  May 03, 2006, which showed approximately 80% left internal carotid artery  stenosis.  A transesophageal echocardiogram was also done which revealed no  evidence of thrombus.  His ejection fraction was normal at 70%.  He also had  trace mild mitral valve prolapse with trivial mitral regurgitation.  He was  discharged home on May 03, 2006, and was told he would be contacted  regarding a follow up appointment.  Apparently, a follow up appointment was  scheduled for June 6, but he did not receive the appointment card until June  7, and another follow up appointment is yet to be scheduled.  In the  meantime, the patient had follow-up with his cardiologist, [name], who has been treating him for peripheral vascular disease.   He  described his recent stroke and the patient requests information of a  vascular surgeon in the [place] area.  Subsequently, he was referred to  [name] for surgical evaluation.  Recently, he was seen at the  [facility] office in consultation, and [name] then recommend left carotid  endarterectomy to reduce his risk for future stroke.  After discussing  risks, benefits and alternatives, [name] agreed to proceed.  Today,  July 17, he is having his preoperative history and physical.  He denies any  further symptoms since his stroke.  He does, however, have intermittent mild  dizziness but has had no history of fall or seizures.  He has had no  unilateral muscle weakness, dysarthria, dysphagia, memory loss or other  visual changes.  As mentioned, he does have a history of ocular migraines  which he says are not associated with headaches per se.  Rather, he  describes more of a brightness and blurriness associated with both eyes and  has seen an ophthalmologist in the  past who felt these were most likely  atypical migraines.  He also has some lower extremity numbness and tingling,  but this has been more or less chronic and felt related to peripheral  vascular disease.  He does describe some claudication symptoms and numbness  of his right lower extremity and foot with prolonged standing and activity  and milder symptoms on the left.  Symptoms resolve with rest, and he has had  no rest pain or ulceration.  He also denies other symptoms such as chest  pain, shortness of breath or hematochezia.   PAST MEDICAL HISTORY:  1.  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  2.  Hypertension.  3.  Hyperlipidemia.  4.  Peripheral vascular occlusive disease.  5.  Ongoing tobacco use.  6.  Ocular migraines.   PAST SURGICAL HISTORY:  1.  Status post bilateral external iliac artery and left common iliac artery      stent by  [name].  2.  History of lower lumbar back surgery x2.  3.  Tonsillectomy.  4.  Tendon repair of the left arm.   ALLERGIES:  HE DEVELOPS A RASH WITH PLAVIX.   MEDICATIONS:  1.  Crestor 10 mg p.o. daily.  2.  Atenolol/chlorthalidone 50/25 mg p.o. daily.  3.  Aggrenox 1 p.o. b.i.d.; last dose on June 20, 2006.  4.  K-Dur 10 mEq p.o. daily.   REVIEW OF SYSTEMS:  Please refer to HPI for pertinent positives and  negatives.   SOCIAL HISTORY:  He is married with two children, and they live in [place].  He smoked one pack a day for 40+ years but is down to half a pack  since his stroke and is trying to quit.  He denies any regular alcohol use.  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   FAMILY HISTORY:  His mother is living at 26 and has had bilateral carotid  artery surgery.  She has also had a stroke and has chronic atrial  fibrillation and heart disease.  Father died at 42 from lung cancer.   PHYSICAL EXAMINATION:  VITAL SIGNS:  Blood pressure 130/81, heart rate 67,  respirations 16, temperature 97.8, oxygen saturation 96%.  GENERAL APPEARANCE:  This is a 65 year old Caucasian male who is alert,  cooperative, in no acute distress.  HEENT:  His head is normocephalic, atraumatic.  Pupils are equal, round and  react to light.  He wears glasses. His sclerae are nonicteric.  His  extraocular movements are intact.  His oral mucosa is pink and moist with no  lesions or erythema noted.  NECK:  His neck is supple with palpable carotid pulses.  No bruits were  auscultated.  No lymphadenopathy or goiter was noted.  RESPIRATORY:  Lung sounds are clear, unlabored, and symmetric in  inspiration.  CARDIAC:  Heart has a regular rate and rhythm.  No murmur, rub or gallop was  noted.  ABDOMEN:  His abdomen was soft, nontender, nondistended with normoactive  bowel sounds.  No hepatomegaly was noted. GENITOURINARY/RECTAL:  These exams are deferred.  EXTREMITIES:   Extremities are warm and dry without edema.  He has 2+  radial  and posterior tibial pulses.  NEUROLOGIC:  Neurologic exam is nonfocal.  His speech is clear.  Gait is  steady.  He is alert and oriented x4.  His muscle strength is 5/5 in his  bilateral upper  and lower extremities.   ASSESSMENT:  Approximately 80% left internal carotid artery stenosis with  recent left middle cerebral artery infarct without residual symptoms.   PLAN:  Mr. Bertram will be electively admitted to [facility] on  June 22, 2006, by [name] to undergo a left carotid hysterectomy.  We have asked Mr. Matamoros to stop taking his Aggrenox in anticipation for  surgery.  We also discussed the importance of smoking cessation at this  visit.      Jacinta Shoe, P.A.      [name] Oto. Fields, MD  Electronically Signed    AWZ/MEDQ  D:  06/20/2006  T:  06/20/2006  Job:  [id number]   cc:   Abbe Amsterdam  Fax: [phone]   Ethelle Lyon, M.D. [facility]  [address] [place]  [place] [address]

## 2011-04-22 NOTE — Op Note (Signed)
NAME:  [name], [name] NO.:  [id number]   MEDICAL RECORD NO.:  [id number]          PATIENT TYPE:  OIB   LOCATION:  2899                         FACILITY:  [place]   PHYSICIAN:  Ethelle Lyon, M.D. LHCDATE OF BIRTH:  Mar 25, 1946   DATE OF PROCEDURE:  12/08/2004  DATE OF DISCHARGE:                                 OPERATIVE REPORT   PROCEDURE:  Abdominal aortography with bilateral lower extremity runoff,  stenting of the left common iliac artery and the right external iliac  artery.   INDICATIONS FOR PROCEDURE:  The patient is a 65 year old gentleman with  atherosclerotic peripheral vascular disease in the setting of hypertension,  dyslipidemia, and ongoing tobacco abuse.  In November of 2004, I stented  both external iliac arteries for claudication.  He had complete resolution  of his symptoms, normalization of pulses, and normalization of ABI's.  Unfortunately, beginning in September of this year, he developed recurrent  claudication in both calves, occurring at approximately 50 to 100 yards of  walking on level ground.  He had no rest pain or ulceration.  Lower  extremity Duplex scan performed on November 19, 2004, demonstrated ABI on  the right of 0.90 and on the left of 0.61.  He was therefore referred for  repeat angiography with an eye to revascularization for his lifestyle-  limiting claudication.   DESCRIPTION OF PROCEDURE:  Informed consent was obtained.  Under 1%  lidocaine local anesthesia, a 5 French sheath was placed in the left common  femoral artery using the modified Seldinger technique.  Pigtail catheter was  advanced over a Wooley wire into the suprarenal abdominal aorta.  Abdominal  aortography was performed by power injection.  Pigtail catheter was then  pulled back to the infrarenal abdominal aorta.  Abdominal aortography with  pelvic imaging was performed by a power injection.  This demonstrated a 70%  stenosis of the external iliac artery  on the right distal to the previously  placed stent.  The previously placed stent was patent.  On the left, the  previously placed external iliac artery stent was widely patent.  However,  there was a new 70% common iliac stenosis there.  At this point, patients  systolic blood pressure fell to 82mmHg and heart rate fell from 50 to 40  bpm.  There was no evidence of bleeding in the left groin.  Blood pressure  and heart rate improved after administration of atropine and iv fluids.   I began with further assessment of the right leg. A Crossover catheter was  used to selectively engage the right common iliac artery.  Over a Wholey  wire, a 5 French end-hole catheter was advanced into the right external  iliac artery.  Lower extremity angiography using digital  subtraction and  step-table technique was performed to image the vessels to the right foot.  200 mcg of intra-arterial nitroglycerin was then administered via the end-  hole catheter.  Pressure pullback was performed demonstrating a 60 mm peak-  to-peak translesional gradient across the lesion of the external iliac  artery distal to the previously placed  stent.  There was no further gradient  back to the abdominal aorta.  The decision was therefore made to proceed  with revascularization of the external iliac artery on the right.   Anticoagulation was initiated with heparin.  ACT was found to be 239 and an  additional 1500 units of heparin admintered.  Crossover catheter was then  used to again selectively engage the artery.  More detailed pressure  measurement was repeated using the same technique and the same catheter.  This again identified the stenosis to be solely distal to the previously  placed stent.  The sheath was exchanged over a wire for a 60F Terumo  Glidesheath which was advanced to the right common iliac artery.  The lesion  was then directly stented using a 10 x 60 mm Smart stent deployed so as to  overlap the distal  portion of the previously placed stent.  The new stent  was then postdilated using an 8 x 40 mm Powerflex for two inflations at 6  atmospheres.  Pressure on these inflations was limited by patient  discomfort.  Discomfort resolved fully with deflation of the balloons.  Repeat angiography demonstrated no residual stenosis, no dissection, and  normal flow.   Attention was then turned to the lesion of the left common iliac artery.  A  4 French end-hole catheter was positioned in the abdominal aorta and the  Terumo sheath pulled back to the left external iliac.  Pressure was too low  to permit intra-arterial nitroglycerin.  Resting gradient was measured and  found to be 22 mmHg across the lesion.  The decision was therefore made to  treat it.  The lesion was directly stented using an 8 x 24 mm Genesis stent  deployed at 10 atmospheres.  Repeat angiography demonstrated no residual  stenosis and normal flow as well as no dissection.   Finally, angiography of the entirety of the left leg was then performed via  the sheath.  The patient was transferred to the holding room in stable  condition having tolerated the procedure well.   COMPLICATIONS:  None.   FINDINGS:  1.  Abdominal aorta:  Mild irregularity of the infrarenal abdominal aorta      without significant stenosis.  2.  Renal arteries:  Single vessels bilaterally.  Both are normal.  3.  Right leg:  Approximately 20% stenosis of the ostium of the common      iliac.  The previously placed stent in the common extending across into      the external iliac is widely patent.  However, there was a 70% stenosis      of the external iliac distal to the stent.  This was treated with a      second self-expanding stent with excellent result.  The profunda is      widely patent.  The SFA has focal 50% stenosis proximally and 70%     stenosis in its midportion.  The popliteal is free of disease.  There is      three-vessel runoff to the foot.  4.   Left leg:  There was a 70% ostial common iliac artery stenosis treated      with 8 x 24 Genesis to no residual stenosis.  The internal iliac has a      50% ostial stenosis.  This vessel collateralizes the occluded right      internal iliac.  The remainder of the common is without significant      disease.  The previously placed stent in the external iliac is widely      patent.  The profunda is widely patent.  There is a focal 80% stenosis      of the SFA at the adductor canal.  There is three-vessel runoff to the      foot.   IMPRESSION:  Successful stenting of the right external iliac artery and left  common iliac artery resulting in no residual stenosis.  In addtinon to the  treated lestions, the patient has had progression of his stenoses in both  superficial femoral arteries.  Will attempt with conservative management of  these.  However, should this fail, they are very focal and would likely  respond well to percutaneous therapy.   He will be treated with aspirin alone.  Plavix will not be administered due  to an allergy (rash).  Smoking cessation and adherence to his exercise  regimen are advised.  Will check ABI's in one weeks' time.      Will   WED/MEDQ  D:  12/08/2004  T:  12/08/2004  Job:  [id number]

## 2011-04-22 NOTE — Progress Notes (Signed)
[place]                          PERIPHERAL VASCULAR OFFICE NOTE   NAME:Svec, KEDRON                         MRN:          [id number]  DATE:08/03/2006                            DOB:          Nov 23, 1946    HISTORY OF PRESENT ILLNESS:  Mr. Snell is a 65 year old gentleman with  hypertension, hypercholesterolemia, ongoing tobacco abuse.  I have  previously stented both external iliac arteries and the left common iliac  artery.  He also had significant focal superficial femoral arterial stenoses  at the level of the adductor canal bilaterally.  Over the past six months,  he has had increasing discomfort in both calves and his right hip with  exertion.  He has not had any rest pain or tissue loss.  He has cut back on  his golfing due to this pain.  He finds it substantially lifestyle limiting.   Mr. Lemming suffered a left brain stroke in May of this year.  He  subsequently underwent left carotid endarterectomy by Dr. Oneida Alar and is  recovering well.   CURRENT MEDICATIONS:  K-Dur 10 mEq per day, atenolol/chlorthalidone 50/25  one per day, Crestor 10 mg per day, and aspirin 325 mg per day.   PHYSICAL EXAMINATION:  GENERAL:  He is well appearing, in no distress.   VITAL SIGNS:  Heart rate 72, blood pressure 122/74 on the left and 118/70 on  the right.  He weighs 221 pounds which is stable.   NECK:  He has no jugular venous distension, thyromegaly or lymphadenopathy.  The incision site on the left carotid is not erythematous and has no  discharge.   LUNGS:  Clear to auscultation.  Respiratory effort is normal.   HEART:  He has a nondisplaced point of maximum cardiac impulse.  There is a  regular rate and rhythm without murmur, rub or gallop.   ABDOMEN:  The abdomen is soft, nondistended, nontender.  There is no  hepatosplenomegaly.   EXTREMITIES:  Extremities are warm without clubbing, cyanosis, edema or  ulceration.   Hair growth is  preserved on both feet.   PULSES:  Femoral pulses 2+ on the right and 1+ on the left with a bruit on  the left.  Popliteal, DP and PT pulses are not palpable on either side.  Carotid pulses are 2+ bilaterally without bruit.   IMPRESSION/RECOMMENDATIONS:  1. Cerebrovascular disease:  Recovering nicely after left endarterectomy.      Followed by Dr. Amedeo Plenty.  2. Lower extremity vascular disease:  The patient has had a recurrence of      his lifestyle limiting claudication.  Physical exam suggests that his      iliac stents are patent.  I suspect he has had progression of his      superficial femoral arterial disease.  I will obtain ABIs and lower      extremity duplex to reassess his anatomy.  If ABIs are different than      before, we will plan on proceeding to revascularization.  3. Hypercholesterolemia:  Followed by Dr. Shelva Majestic.  Goal LDL less than  70, HDL greater than 40.  4. Tobacco abuse:  Again emphasized critical importance of complete      cessation.  5. Right hip discomfort:  Etiology unclear.  It is possible that he has      developed a right internal iliac artery stenosis that is contributing.      Osteoarthritis and radiation from his chronic low back pain are also      potential contributors.                                   Ethelle Lyon, MD   WED/MedQ  DD:  08/03/2006  DT:  08/04/2006  Job #:  [id number]

## 2011-04-22 NOTE — Op Note (Signed)
NAME:  [name], [name] NO.:  [id number]   MEDICAL RECORD NO.:  [id number]          PATIENT TYPE:  INP   LOCATION:  2858                         FACILITY:  [facility]   PHYSICIAN:  Ethelle Lyon, MD  DATE OF BIRTH:  Apr 03, 1946   DATE OF PROCEDURE:  03/28/2007  DATE OF DISCHARGE:                               OPERATIVE REPORT   PROCEDURE:  Right leg angiography via contralateral lower extremity  approach.  Catheter placed in the right superficial femoral artery,  stenting of the right superficial femoral artery, Star close closure of  the left common femoral arteriotomy site.   INDICATIONS:  [name] is a 65 year old gentleman with longstanding  peripheral arterial disease.  I previously stented both common iliac  arteries, left common iliac artery, and the proximal portion of the  right superficial femoral artery.  After the most recent work in the  right external iliac and right SFA, his ABI was 0.95 on the right.  Unfortunately, his symptoms recurred over the past several months.  ABI  has dropped to 0.79 on the right.  Duplex ultrasound suggested severe in-  stent restenosis in the right external iliac and new severe stenosis in  the right superficial femoral artery, distal to the previously stented  segment.  His claudication is substantially lifestyle-limiting.  He  returns today for planned angiography and possible percutaneous  revascularization of the right leg.  Will plan on staged intervention on  the left.   PROCEDURE TECHNIQUE:  Informed consent was obtained.  Under 1% lidocaine  local anesthesia, a 6-French Terumo glide sheath was placed in the left  common femoral artery using modified Seldinger technique.  Pigtail  catheter was positioned in the abdominal aorta.  Abdominal aortography  was performed by power injection.  This demonstrated no right external  iliac artery stenosis.  I then used a crossover catheter to selectively  engage the right  common iliac artery.  Advanced a Wholey wire distally  without difficulty.  I then advanced the Terumo glide sheath into the  right external iliac artery.  I performed a angiography of the right leg  via injection through the sheath.  Digital subtraction and step table  technique was performed.  This confirmed patency of both the external  iliac stent and the SFA stent.  However, there is a new 80% stenosis in  the right SFA.  We elected to proceed with revascularization of this.   Anticoagulation was initiated with bivalirudin.  A Wholey wire was  advanced across the lesion without difficulty.  The lesion was directly  stented using a 80 mm Exceed stent.  The stent was then postdilated  using a 7 x 80 mm Agile tract balloon for two inflations each at 10  atmospheres.  I then performed pressure pullback across the external  iliac stent and showed no gradient.   Final angiography demonstrated no residual stenosis in the SFA, no  dissection, and normal flow distally.  There was some mild spasm in  between the two stented segments.   The arteriotomy was then closed using a Star close  device.  Complete  hemostasis was obtained.  He was then transferred to holding room in  stable condition having tolerated the procedure well.   COMPLICATIONS:  None.   FINDINGS:  1. Abdominal aorta:  Diffuse plaquing with possible very small      aneurysm and no significant stenosis.  2. Left iliac:  Widely patent common and external iliac artery stents      with patent internal iliac artery.  3. Right leg:  Patent common and external iliac stents.  Occluded      internal iliac artery.  Common femoral has mild plaquing but no      significant stenosis.  The profunda is widely patent.  The proximal      SFA stent is widely patent.  There is an 80% stenosis below the      previously placed SFA stent.  This was stented to no residual using      an 8 x 80 mm Exceed stent.  The remainder of the SFA and  profunda      are only minimal plaquing.  There is three-vessel runoff to the      foot.  There is a 70% stenosis in the proximal portion of the large      posterior tibial artery.   IMPRESSION/RECOMMENDATIONS:  Successful revascularization of the right  SFA.  Will return for treatment of the left SFA in one weeks' time.  Continue aspirin and Ticlid.  Smoking cessation is again strongly  advised.      Ethelle Lyon, MD  Electronically Signed     WED/MEDQ  D:  03/28/2007  T:  03/28/2007  Job:  [id number]

## 2011-04-22 NOTE — Discharge Summary (Signed)
NAME:  [name], [name] NO.:  [id number]   MEDICAL RECORD NO.:  [id number]          PATIENT TYPE:  INP   LOCATION:  3301                         FACILITY:  [facility]   PHYSICIAN:  John Giovanni, P.A.-C. DATE OF BIRTH:  1946/05/03   DATE OF ADMISSION:  06/22/2006  DATE OF DISCHARGE:  06/23/2006                                 DISCHARGE SUMMARY   HISTORY OF PRESENT ILLNESS:  The patient is a 65 year old male referred to  Dr. Oneida Alar regarding a severe 60% and 99% stenosis of the left internal  carotid artery.  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  Carotid Doppler study at that time revealed the right internal carotid  artery stenosis at less than 40%, but the left had a 60% and 99% stenosis.  This was confirmed with cerebral arteriogram on May 03, 2006, which showed  approximately 80% left internal carotid artery stenosis.  A transesophageal  echocardiogram was also done and revealed no evidence of thrombus.  His  ejection fraction was normal at 70%.  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  He was discharged on May 03, 2006 and was  told to follow up on May 10, 2006, however his appointment card did not come  until May 11, 2006 and he was waiting for another appointment.  In the  meantime, while waiting, he had a follow up with his cardiologist, Dr.  Sabino Snipes, who has been treating him for peripheral vascular disease.  He described the stroke and other events and Dr. Albertine Patricia recommended that he  see a cardiovascular and thoracic surgery here in [place].  He was  referred to Dr. Oneida Alar who evaluated the patient and his studies and  recommended proceeding with a left internal carotid artery stenosis.  He was  admitted this hospitalization for the procedure.  For further details please  see the history and physical.   PAST MEDICAL  HISTORY:  1.  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  2.  Hypertension.  3.  Hyperlipidemia.  4.  Peripheral vascular occlusive disease.  5.  Ongoing tobacco use.  6.  History of ocular migraines.   PAST SURGICAL HISTORY:  1.  Status post bilateral external iliac and left common iliac artery      stenting by Dr. Sabino Snipes.  2.  History of lower lumbar back surgery x2.  3.  Tonsillectomy.  4.  Tendon repair to the left arm.   ALLERGIES:  PLAVIX causes a rash.   MEDICATIONS PRIOR TO ADMISSION:  1.  Crestor 10 mg daily.  2.  Atenolol/chlorthalidone 50/25 mg daily.  3.  Aggrenox 1 p.o. b.i.d. with last dose June 20, 2006.  4.  Zigmund Daniel Dur 10 mEg daily.   FAMILY HISTORY/SOCIAL HISTORY/REVIEW OF SYSTEMS AND PHYSICAL EXAMINATION:  Please see the history and physical done at time of admission.   HOSPITAL COURSE:  The patient was  admitted electively and on June 22, 2006  was taken to the operating room, at which time he underwent the following  procedure:  Left carotid endarterectomy with Dacron patch angioplasty.  The  patient tolerated the procedure well and was taken to the post anesthesia  care unit in stable condition.   POSTOPERATIVE HOSPITAL COURSE:  The patient has done well.  He initially did  require some Dopamine for blood pressure support.  He developed some nausea  felt to be associated with this.  His blood pressure stabilized and the  Dopamine was able to be weaned off.  Oxygen was also weaned.  His laboratory  values postoperatively are quite stable.  Hemoglobin and hematocrit post op  day 1 were 15.6 and 45.1 respectively.  Electrolytes, BUN, and creatinine  were all within normal limits.  The patient tolerated a rapid advancement in  activity commensurate with the level of postoperative convalescence using  routine protocols.  He was neurologically intact with no new findings.  His   incision showed good signs of healing without evidence of infection,  bleeding, or hematoma.  His overall status was felt to be quite stable for  discharge on July 20/2007.   CONDITION ON DISCHARGE:  Stable and improving.   DISCHARGE INSTRUCTIONS:  The patient received written instructions regarding  medications, activity, diet, wound care, and follow up.  Follow up includes  Dr. Oneida Alar.  The office will contact him.   MEDICATIONS ON DISCHARGE:  1.  Coated aspirin 325 mg daily.  2.  He is to continue his Crestor 10 mg daily.  3.  Continue his Atenolol/chlorthalidone 50/25 mg daily.  4.  He was told not to restart his Aggrenox for pain.  5.  Tylenol 1 or 2 every 4 hours as needed.   FINAL DIAGNOSES:  1.  Left internal carotid artery stenosis, severe, with recent left middle      cerebral artery cerebral vascular accident on May 01, 2006.  2.  Hypertension.  3.  Hyperlipidemia.  4.  Peripheral vascular occlusive disease.  5.  Ongoing tobacco abuse.  6.  Ocular migraines.  7.  Previous surgeries as listed per the history.      John Giovanni, P.A.-C.     Loren Racer  D:  06/23/2006  T:  06/23/2006  Job:  [id number]   cc:   Jessy Oto. Fields, MD  [place], [place] [address]   Ethelle Lyon, M.D. [facility]  [address]  [place] [address]

## 2011-04-22 NOTE — Op Note (Signed)
NAME:  [name], [name]                ACCOUNT NO.:  [id number]   MEDICAL RECORD NO.:  [id number]          PATIENT TYPE:  AMB   LOCATION:  SDS                          FACILITY:  [facility]   PHYSICIAN:  Ethelle Lyon, MD  DATE OF BIRTH:  11/16/1946   DATE OF PROCEDURE:  08/23/2006  DATE OF DISCHARGE:  08/23/2006                                 OPERATIVE REPORT   PROCEDURE:  Abdominal aortography with bilateral lower extremity runoff,  selective catheterization of the right superficial femoral artery via  contralateral approach, balloon angioplasty of the left external iliac  artery, right external iliac artery, and two places within the right  superficial femoral artery, bail out stenting of the right external iliac  artery and right superficial femoral artery, Starclose closure of the left  common femoral arteriotomy site.   INDICATIONS:  [name] is a 65 year old gentleman with hypertension,  hypercholesterolemia, and ongoing tobacco abuse.  I have previously stented  both external iliac arteries and the left common iliac artery.  After this  intervention, ABIs were 0.96 on the right and 0.94 on the left.  Over the  past six months, however, he has had progressive bilateral calf discomfort  with exertion.  ABIs have dropped to 0.65 on the right and 0.66 on the left.  This has proved substantially lifestyle limiting as it is keeping him from  playing golf and the walking he would like to do for exercise.  He,  therefore, presents for angiography with an eye to percutaneous  revascularization.   PROCEDURE TECHNIQUE:  Informed consent was obtained.  Under 1% lidocaine  local anesthesia, a 5-French sheath was placed in the left common femoral  artery using the modified Seldinger technique.  A pigtail catheter was  advanced to the suprarenal abdominal aorta.  Abdominal aortography was  performed by power injection.  The pigtail catheter was then pulled back to  the infrarenal abdominal  aorta.  Abdominal aortography with bilateral lower  extremity runoff was performed.  These images demonstrated focal 90% in-  stent restenosis within the left external iliac artery stent, 80% in-stent  restenosis extending from the distal margin of the right external iliac  artery stent just beyond the stented segment, and two focal stenoses within  the right superficial femoral artery.  The decision was made to intervene on  these lesions.   Anticoagulation was initiated with heparin to achieve and maintain an ACT of  greater than 250 seconds.  I exchanged the sheath over a wire for a 6-French  Terumo glide sheath.  I began by treating the left external iliac using an 8  x 20 mm Agiltrac balloon inflated once to 8 atmospheres and a second time to  10 atmospheres.  This reduced the stenosis from 90% to 10%.  I then  selectively catheterized the right common iliac artery using crossover  catheter.  I advanced a Wholey wire into the distal left SFA.  I then  advanced the Terumo glide sheath into the right common iliac artery over  this wire.  I began by treating the right external iliac  artery using an 8 x  20 mm Agiltrac tract balloon at 8 atmospheres.  This reduced the stenosis to  approximately 50%.  There was a small associated dissection.  I decided to  treat the right superficial femoral arterial stenoses and then reassess the  need for stenting after doing so.   I, therefore, advanced the Terumo glide sheath over the wire into the left  common femoral artery.  I advanced the Wholey wire into the distal portion  of the right popliteal artery without difficulty.  I advanced a 5 x 40 mm  Agiltrac balloon to the distal lesion and inflated to 8 atmospheres for 90  seconds.  I then pulled it back to the more proximal lesion and inflated it,  also, to 8 atmospheres for 60 seconds.  This balloon was clearly undersized  relative to the vessel.  I, therefore, treated each lesion in sequence  using  a 7 x 40 mm Agiltrac balloon at 8 atmospheres.  I inflated the balloon  slowly and left it inflated for 3 minutes to the minimize the risk of  dissection.  Despite this, there was a severe spiral dissection in the  proximal lesion.  I treated this using an 8 x 80 mm Xceed stent deployed  just across the proximal lesion.  I post dilated the stent using a 7 x 40 mm  Agiltrac balloon for two inflations at 6 atmospheres and one inflation at 8  atmospheres.  Final angiography in these lesions demonstrated no residual  stenosis in the stented proximal lesion, less than 20% residual stenosis in  the more distal lesion.   I then pulled the Terumo guide sheath back into the right common iliac  artery to allow reassessment of the right external iliac artery.  I  performed pressure interrogation after the administration of intra-arterial  nitroglycerin (200 mcg).  This demonstrated a residual gradient of 140 mmHg.  I decided to stent this using a 9 x 20 mm Smart stent deployed so as to  overlap the previously placed stent and extend more distally.  I then post  dilated the stented segment using an 8 x 20 mm Agiltrac balloon at 8  atmospheres.  There was no residual stenosis.  The catheter was then removed  over wire.  The sheath was then pulled back and the arteriotomy closed using  a Starclose device.  Complete hemostasis was obtained.  He was then  transferred to the holding room in stable condition.   COMPLICATIONS:  None.   FINDINGS:  1. Abdominal aorta:  Infrarenal abdominal aorta has diffuse plaque without      significant stenosis and no evidence of aneurysm.  2. Renal arteries:  Single vessels bilaterally.  Both are normal.  3. Right leg:  The distal portion of the common iliac has previously been      stented.  The common iliac has no in-stent restenosis.  The internal      iliac is patent.  The external iliac portion of the stented segment had     an 80% in-stent restenosis  which extended just distally to the stented      segment.  After suboptimal angioplasty result, it was treated with self-      expanding stent to no residual stenosis.  The common femoral was normal      as is the profunda.  The superficial femoral artery has diffuse      disease.  There was two focal severe stenoses, the proximal  of which      was stented and the distal of which was treated balloon angioplasty      with excellent result.  The popliteal was normal.  There is a 60%      stenosis in the proximal portion of the anterior tibial.  There is      three-vessel runoff to the foot.  4. Left leg:  Normal common iliac.  The internal iliac is patent.  The      external iliac stent had a focal 90% stenosis treated with balloon      angioplasty to less than 10% residual.  The common femoral and profunda      are normal.  The superficial femoral artery has a focal 95% stenosis      distally.  The popliteal is normal.  There is a 40% stenosis in the mid      portion of the anterior tibial.  There is three-vessel runoff to the      foot.   IMPRESSION/RECOMMENDATIONS:  Successful revascularization of the left and  right external iliac arteries and right superficial femoral artery.  I will  assess his response to this therapy.  If he remains symptomatic in the left  leg and this is lifestyle limiting, could consider percutaneous treatment of  the focal stenosis of the left SFA.  We will treat him with two weeks of  Ticlid due to his Plavix allergy.      Ethelle Lyon, MD  Electronically Signed     WED/MEDQ  D:  09/06/2006  T:  09/08/2006  Job:  [id number]

## 2011-04-22 NOTE — Progress Notes (Signed)
[place]                          PERIPHERAL VASCULAR OFFICE NOTE   NAME:[name], [name]                         MRN:          [id number]  DATE:09/19/2006                            DOB:          01/03/1946    HISTORY OF PRESENT ILLNESS:  Mr. [name] is a 65 year old gentleman with  longstanding peripheral arterial disease.  He presented with recurrent  claudication recently.  We proceeded to angiography and balloon angioplasty  of focal restenosis in the left external iliac, stenting within the right  external iliac artery, and stenting and balloon angioplasty of the right  superficial femoral artery.  His claudication has been essentially resolved  since then.  He has a severe stenosis in the mid left SFA.  He does not feel  that this is resulting in lifestyle-limiting symptoms.   CURRENT MEDICATIONS:  1. K-Dur 10 mEq per day.  2. Atenolol/chlorthalidone 50/25 one per day.  3. Crestor 10 mg per day.  4. Aspirin 325 mg per day.   ALLERGIES:  He develops a rash to PLAVIX.   PHYSICAL EXAMINATION:  He is generally well-appearing in no distress with a  heart rate 68, blood pressure 122/74, and weight of 217 pounds.  Blood  pressure is equal in both arms.  He has no jugular venous distention and no  thyromegaly.  Lungs are clear to auscultation.  He has a normal respiratory  effort.  He has a nondisplaced point of maximal cardiac impulse.  There is  regular rate and rhythm without murmur, rub, or gallop.  The abdomen is  soft, nondistended, nontender.  No hepatosplenomegaly.  Bowel sounds normal.  No pulsatile midline mass and no abdominal bruit.  Femoral pulses 2+  bilaterally without bruit.  Carotid pulses 2+ bilaterally without bruit.  Right DP pulse 2+.  Left DP and PT not palpable.   IMPRESSION/RECOMMENDATION:  Marked improvement in symptoms.  Since left SFA  stenosis is not lifestyle limiting will plan conservative management.       [name] Lyon, MD      WED/MedQ  DD:  09/19/2006  DT:  09/20/2006  Job #:  [id number]

## 2011-04-22 NOTE — Progress Notes (Signed)
[place]                        PERIPHERAL VASCULAR OFFICE NOTE   NAME:JUSTICEGildardo, Lardner                         MRN:          [id number]  DATE:03/22/2007                            DOB:          October 28, 1946    HISTORY OF PRESENT ILLNESS:  Mr. Beitel is a 65 year old gentleman with  longstanding peripheral arterial disease.  I have previously stented  both external iliac arteries, the left common iliac artery, and the  right proximal SFA.  The most recent procedure was in September with  right ABI improving to 0.95 and the left stable at 0.73.  He now  presents with recurrent bilateral hip and calf discomfort with walking.  He has not had any rest pain.  Symptoms are clearly worse when walking  on an incline and are worse when he tried to walk on the beach recently.  He can walk less than 200 yards on level ground.  He finds this  substantially lifestyle-limiting, as it is keeping him from playing golf  and doing the shopping that he likes to do.   Repeat duplex today demonstrated an ABI down to 0.79 on the right and  0.71 on the left with severe end-stent restenosis in the external iliac  artery with a peak systolic velocity of 99991111 cm per second.  The right  SFA has a new severe stenosis distal to the previously stented segment  with a peak systolic velocity of 123456 cm per second.  On the left, there  is a focal SFA stenosis, which has a peak systolic velocity of XX123456 cm  per second.   PAST MEDICAL HISTORY:  1. Hypertension.  2. Hypercholesterolemia.  3. Peripheral vascular arterial disease.  4. Ongoing tobacco abuse.  5. Ocular migraines.  6. Left MCA stroke and subsequent left carotid endarterectomy in 2007.  7. History of lumbar spinal surgery x2.  8. Tonsillectomy.  9. Tendon repair of the left arm.   ALLERGIES:  Develops a rash with PLAVIX.   CURRENT MEDICATIONS:  1. Potassium 10 mEq daily.  2. Atenolol/chlorthalidone 50/25 once daily.  3. Crestor 10 mg daily.  4. Aspirin 325 mg daily.   SOCIAL HISTORY:  Married with two children.  Lives with his wife in  [place].  Back up to smoking about a pack per day.  Frustrated that  he has been unable to quit.  Did have some temporary success with  Chantix.  Is willing to retry it again.  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  Denies regular alcohol use.   FAMILY HISTORY:  Mother is alive at 67 and has had bilateral carotid  artery surgery at a late age.  Father died at 15 of lung cancer.   REVIEW OF SYSTEMS:  Negative in detail, except as above.   PHYSICAL EXAMINATION:  GENERAL/VITAL SIGNS:  He is generally well-  appearing in no distress with a heart rate of 64, blood pressure 130/78,  and weight of 224 pounds.  Weight is equal bilaterally.  SKIN:  Normal.  HEENT:  Normal.  MUSCULOSKELETAL:  Normal.  NECK:  He has no  jugular venous distention, thyromegaly, or  lymphadenopathy.  LUNGS:  Clear to auscultation.  CARDIAC:  He has a nondisplaced point of maximal cardiac impulse.  There  is a regular rate and rhythm without murmur, rub or gallop.  ABDOMEN:  Soft, nontender, nondistended.  There is no  hepatosplenomegaly.  Bowel sounds are normal.  EXTREMITIES:  Warm without clubbing, cyanosis, edema, or ulceration.  PERIPHERAL VASCULAR:  Carotid pulses are 2+ bilaterally without bruit.  Femoral pulses are 2+ bilaterally without bruit.  The left popliteal, DP  and PT pulses are not palpable.  On the right, the DP/PT are 1+.   IMPRESSION/RECOMMENDATIONS:  1. Lower extremity arterial disease.  Recurrent lifestyle-limiting      lower extremity claudication.  By duplex, he looks to have      restenosis in the right external iliac and a new lesion in the      right superficial femoral artery.  Stable left superficial femoral      artery stenosis.  He wishes revascularization of both due to      lifestyle-limiting claudication.  Will plan on access from the left       with plan to treat the right external iliac and superficial femoral      artery.  Will then have a staged procedure for the left superficial      femoral artery.  Will start Ticlid and continue aspirin.  2. Cerebrovascular disease:  Doing well after left carotid      endarterectomy.  3. Hypercholesterolemia:  Followed by Dr. Shelva Majestic.  Goal less than      70.  4. Tobacco abuse:  Will restart Chantix.  Emphasized the critical      importance of complete cessation.     Ethelle Lyon, MD  Electronically Signed    WED/MedQ  DD: 03/22/2007  DT: 03/22/2007  Job #: [id number]   cc:   Abbe Amsterdam

## 2011-04-22 NOTE — Progress Notes (Signed)
[place]                          PERIPHERAL VASCULAR OFFICE NOTE   NAME:JUSTICEPerez, Mickels                         MRN:          [id number]  DATE:08/22/2006                            DOB:          1946-02-19    HISTORY OF PRESENT ILLNESS:  Mr. Frere is a 65 year old gentleman with  longstanding peripheral arterial disease.  I have previously stented both  external iliacs and the left common iliac artery.  He has also had  significant focal SFA stenoses at the level of the adductor canal  bilaterally.  Over the last 6 months he has had increasing discomfort in  both calves and his right hip with exertion.  He has not had any rest pain  or tissue loss.  This has caused him to cut back on his golfing.  It is thus  very substantially lifestyle limiting.  After his prior iliac artery  stenting his ABIs were 0.96 on the right and 0.94 on the left.  As assessed  today, they have dropped to 0.65 on the right and 0.66 on the left.  There  is a proximal SFA stenosis on the right with peak systolic velocity of 123XX123  cm per second, and then a second stenosis in the mid right SFA.  There is  monophasic flow below this.  There is also monophasic flow on the inflow on  the right.  On the left, there is biphasic inflow with a severe stenosis of  the mid to distal SFA and monophasic flow below this.  The patient feels  that his right leg has been more symptomatic.   PAST MEDICAL HISTORY:  1. Hypertension.  2. Hypercholesterolemia.  3. Peripheral vascular atherosclerotic disease.  4. Ongoing tobacco abuse.  5. Ocular migraines.  6. Left MCA stroke and subsequent left carotid endarterectomy 2007.  7. History of lower lumbar spinal surgery x2.  8. Tonsillectomy.  9. Tendon repair of the left arm.   ALLERGIES:  Develops a rash with PLAVIX.   CURRENT MEDICATIONS:  1. Potassium 20 mEq per day.  2. Atenolol/chlorthalidone 50/25 one per day.  3. Crestor 10 mg per  day.  4. Aspirin 325 mg per day.   SOCIAL HISTORY:  Married with two children and lives in [place].  Continues to smoke a half pack per day.  Interested in quitting.  Denies  regular alcohol use.  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   FAMILY HISTORY:  Mother is alive at 40 and has had bilateral carotid artery  surgery.  Father died at 91 of lung cancer.   REVIEW OF SYSTEMS:  Negative in detail except as above.   PHYSICAL EXAMINATION:  GENERAL:  He is generally well-appearing in no  distress with heart rate 70, blood pressure 134/82 on the left and 132/80 on  the right.  Weight is 222 pounds.  HEENT:  Normal.  SKIN:  Normal.  MUSCULOSKELETAL:  Normal.  NECK:  He has no jugular venous distention or thyromegaly.  There is no  cervical, supraclavicular, or axillary lymphadenopathy.  Carotid  endarterectomy site on the left is  well healed.  RESPIRATORY:  He had normal respiratory effort.  Lungs are clear to  auscultation.  CARDIAC:  He has a nondisplaced point of maximal cardiac impulse.  There is  regular rate and rhythm without murmur, rub, or gallop.  ABDOMEN:  Soft, nondistended, nontender.  There is no hepatosplenomegaly.  Bowel sounds are normal.  No pulsatile midline mass.  No abdominal bruit.  EXTREMITIES:  Warm without clubbing, cyanosis, edema, or ulceration.  Hair  growth preserved on both feet.  VASCULAR:  Carotid pulses 2+ bilaterally without bruit.  Femoral pulses 1+  bilaterally with a bruit on the left.  Popliteal, DP, and PT  pulses are not  palpable on either side.   IMPRESSION/RECOMMENDATIONS:  1. Lower extremity vascular disease:  Recurrence of lifestyle-limiting      claudication.  Appears to have restenosis of the right external iliac      stent and possibly also on the left.  In addition, he has significant      SFA disease bilaterally.  His symptoms are lifestyle limiting and have      not been improved with his regular walking.  Will therefore  plan      revascularization.  Will plan on working on the right leg first as this      is the more symptomatic side.  Therefore, plan access on the left.      Will not start Plavix due to his allergy.  Continue aspirin.  2. Cerebrovascular disease:  Stable after left carotid endarterectomy.  3. Hypercholesterolemia:  Followed by Dr. Shelva Majestic.  Goal LDL less than      70.  4. Tobacco abuse:  Again, emphasized critical importance of complete      cessation.  Will prescription Chantix today.  He is interested in      taking this.                                   Ethelle Lyon, MD   WED/MedQ  DD:  08/22/2006  DT:  08/23/2006  Job #:  [id number]

## 2011-04-22 NOTE — Discharge Summary (Signed)
NAME:  VALEN, LANDWEHR NO.:  [id number]   MEDICAL RECORD NO.:  [id number]                   PATIENT TYPE:  OIB   LOCATION:  [id number]                                 FACILITY:  [facility]   PHYSICIAN:  Ethelle Lyon, M.D.             DATE OF BIRTH:  [birth year]   DATE OF ADMISSION:  10/17/2003  DATE OF DISCHARGE:  10/18/2003                                 DISCHARGE SUMMARY   HISTORY OF PRESENT ILLNESS:  Mr. Septer was referred to our practice by Dr.  Annitta Jersey secondary to evaluation of leg pain.  Dr. Albertine Patricia did an office  evaluation and felt that he had both typical and atypical symptoms of  claudication along with a positive physical exam.  He checked ankle brachial  indexes with duplex ultrasonography, which showed two tandem stenoses in the  proximal to mid SFA on the right and on the left monophasic flow in the  common femoral indicating an ABI on the right of 0.79 and on the left of  0.66.  Dr. Albertine Patricia then suggested definitive treatment secondary to quality  of life.  He ordered baseline CT angiogram with runoff to map his disease  and then plan percutaneous revascularization.  This was the reason for  admission on October 17, 2003.   PAST MEDICAL HISTORY:  Significant for:  1. Hypertension.  2. Tobacco use.  3. Lumbar spine surgery in 1994 and 1997.  4. Dyslipidemia.  5. Osteoarthritis of the left knee.  6. Benign prostatic hypertrophy.   ALLERGIES:  MORPHINE.   SOCIAL HISTORY:  The patient is married with two children.  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  He enjoys golf.  He  smokes approximately one pack per day for 24 years and continues to do so.  He denies any alcohol or illicit drug use.   FAMILY HISTORY:  His father died at age 12 of lung cancer.  His mother is  alive at age 17 with coronary artery disease.  A single brother is alive  with hypertension.   HOSPITAL COURSE:  As noted, the patient was  admitted to [facility] on  October 17, 2003.  He was admitted to undergo abdominal angiogram with  subsequent runoff.  The findings showed diffuse mild disease of the aorta  without change, single renal arteries bilaterally within normal limits, left  leg with 50% common iliac stenosis, 90% external iliac stenosis stented to  0%, mild plaquing of SFA, and normal profundus and three-vessel runoff and  right leg with 70% external iliac stenosis with serial 30-50% stenosis of  SFA, normal profundus and popliteal three-vessel runoff, resultant 0%  residual stenosis both right and left external iliacs, and successful PTCA  and stenting of bilateral external iliac arteries with excellent  angiographic and clinical results.  The patient was kept overnight for  evaluation.  It was planned to continue  Plavix for 30 days and aspirin  indefinitely.  Vital signs on October 18, 2003, showed BP 140/70 and pulse  62.  The patient denies any chest pain or shortness of breath.  The exam was  within normal limits.  He was set up for discharge.   LABORATORY DATA:  On October 18, 2003, white blood count 10.3, hemoglobin  15.9, hematocrit 45.9, and platelets 204.  Pro time 12.0, INR 0.8, PTT 34.  Chemistries:  The sodium was 138, potassium 43, chloride 105, CO2 26,  glucose 93, BUN 18, creatinine 1.2, and calcium 8.7.   DISCHARGE MEDICATIONS:  The patient was told to stay on preadmission  medications, which include:  1. Plavix 75 mg one p.o. daily.  He is to remain on this for 30 days.  2. Aspirin, enteric coated, 325 mg once daily.  3. Crestor 10 mg one q.h.s.  4. Celebrex 200 mg one p.o. daily.  5. Atenolol/__________ 50/25 mg one p.o. daily.   The patient was advised to use Tylenol as needed.   ACTIVITY:  The patient's instructions included no lifting, driving, sexual  activity, and heavy exertion x 2 days.   DIET:  He is to follow a low-fat, low-cholesterol, low-fat diet.   WOUND CARE:  He was  to observe the catheterization site for bruising,  swelling, drainage, and discomfort.   SPECIAL INSTRUCTIONS:  He was also strongly encourage smoking cessation and  explained the risks of continuing this activity.   FOLLOWUP:  He was set up for followup with a P.A. groin check in one week  and to follow up with Dr. Albertine Patricia in one month.  The office will contact him  with these appointment times.  He is also advised to follow up with his  primary care physician in the next several weeks.   DISCHARGE DIAGNOSES:  1. Status post percutaneous transluminal angiography to external iliacs.  2. Hypertension.  3. Tobacco abuse.  4. Hypercholesterolemia.  5. Benign prostatic hypertrophy.      [place], [place]                       Ethelle Lyon, M.D.    MP/MEDQ  D:  10/18/2003  T:  10/18/2003  Job:  [id number]   cc:   Dr. Annitta Jersey

## 2011-05-13 ENCOUNTER — Other Ambulatory Visit: Payer: Self-pay | Admitting: Cardiovascular Disease

## 2011-05-15 ENCOUNTER — Other Ambulatory Visit: Payer: Self-pay | Admitting: Cardiovascular Disease

## 2011-08-11 ENCOUNTER — Other Ambulatory Visit: Payer: Self-pay | Admitting: Cardiovascular Disease

## 2011-08-25 LAB — BASIC METABOLIC PANEL
BUN: 14
BUN: 23
CO2: 27
CO2: 28
Calcium: 8.3 — ABNORMAL LOW
Calcium: 9.6
Chloride: 99
Creatinine, Ser: 1.03
Creatinine, Ser: 1.32
Glucose, Bld: 117 — ABNORMAL HIGH

## 2011-08-25 LAB — CBC
Hemoglobin: 12.8 — ABNORMAL LOW
MCHC: 34.4
MCHC: 34.6
MCV: 88.2
Platelets: 125 — ABNORMAL LOW
Platelets: 183
RDW: 13.2

## 2011-08-25 LAB — TYPE AND SCREEN: ABO/RH(D): AB POS

## 2011-09-29 ENCOUNTER — Other Ambulatory Visit (INDEPENDENT_AMBULATORY_CARE_PROVIDER_SITE_OTHER): Payer: Medicare Other | Admitting: Vascular Surgery

## 2011-09-29 DIAGNOSIS — Z48812 Encounter for surgical aftercare following surgery on the circulatory system: Secondary | ICD-10-CM

## 2011-09-29 DIAGNOSIS — I6529 Occlusion and stenosis of unspecified carotid artery: Secondary | ICD-10-CM

## 2011-11-09 ENCOUNTER — Other Ambulatory Visit: Payer: Self-pay | Admitting: Cardiovascular Disease

## 2011-11-11 ENCOUNTER — Other Ambulatory Visit: Payer: Self-pay | Admitting: Cardiovascular Disease

## 2011-12-07 ENCOUNTER — Other Ambulatory Visit: Payer: Self-pay | Admitting: *Deleted

## 2011-12-07 DIAGNOSIS — I739 Peripheral vascular disease, unspecified: Secondary | ICD-10-CM

## 2011-12-09 ENCOUNTER — Encounter (INDEPENDENT_AMBULATORY_CARE_PROVIDER_SITE_OTHER): Payer: Medicare Other | Admitting: *Deleted

## 2011-12-09 DIAGNOSIS — I739 Peripheral vascular disease, unspecified: Secondary | ICD-10-CM

## 2011-12-09 DIAGNOSIS — I70219 Atherosclerosis of native arteries of extremities with intermittent claudication, unspecified extremity: Secondary | ICD-10-CM

## 2012-02-06 ENCOUNTER — Other Ambulatory Visit: Payer: Self-pay | Admitting: Cardiovascular Disease

## 2012-02-07 ENCOUNTER — Other Ambulatory Visit: Payer: Self-pay | Admitting: Cardiovascular Disease

## 2012-02-07 ENCOUNTER — Telehealth: Payer: Self-pay | Admitting: Cardiovascular Disease

## 2012-02-07 NOTE — Telephone Encounter (Signed)
Fu call °Patient returning your call °

## 2012-02-07 NOTE — Telephone Encounter (Signed)
I made the pt aware that I had not tried to contact him.  I did go ahead and schedule the pt to see Dr Burt Knack in April.

## 2012-02-29 ENCOUNTER — Encounter: Payer: Self-pay | Admitting: *Deleted

## 2012-03-06 ENCOUNTER — Encounter: Payer: Self-pay | Admitting: Cardiovascular Disease

## 2012-03-06 ENCOUNTER — Ambulatory Visit (INDEPENDENT_AMBULATORY_CARE_PROVIDER_SITE_OTHER): Payer: Medicare Other | Admitting: Cardiovascular Disease

## 2012-03-06 ENCOUNTER — Other Ambulatory Visit: Payer: Self-pay | Admitting: Cardiovascular Disease

## 2012-03-06 VITALS — BP 134/74 | HR 59 | Ht 74.0 in | Wt 234.0 lb

## 2012-03-06 DIAGNOSIS — I10 Essential (primary) hypertension: Secondary | ICD-10-CM

## 2012-03-06 DIAGNOSIS — E7800 Pure hypercholesterolemia, unspecified: Secondary | ICD-10-CM

## 2012-03-06 DIAGNOSIS — I70219 Atherosclerosis of native arteries of extremities with intermittent claudication, unspecified extremity: Secondary | ICD-10-CM

## 2012-03-06 DIAGNOSIS — I6529 Occlusion and stenosis of unspecified carotid artery: Secondary | ICD-10-CM

## 2012-03-06 NOTE — Progress Notes (Signed)
   HPI:  66 year old gentleman presenting for followup of peripheral arterial disease. The patient has lower extremity PAD and carotid disease. He has undergone carotid endarterectomy and subsequent stenting for treatment of restenosis. When he was seen last his antihypertensive medications were adjusted as he had suboptimal blood pressure control. He has been compliant with amlodipine, carvedilol, and Maxzide. The patient's walking is very limited. He has difficulty characterizing his specific limitation, but states that he is weak when he walks. He does have some right hip discomfort. He denies calf pain with ambulation. ABIs were done 12/09/2011 and there were 93% on the right at 63% on the left. The right SFA stents were patent. The distal left SFA stent is occluded. ABI values were stable from his study the previous year. The patient has no rest pain or skin ulceration. He denies chest pain or pressure. He has chronic dyspnea and he continues to smoke cigarettes. He denies orthopnea, PND, or palpitations. He complains of right foot numbness with prolonged standing. He complains of swelling in the right ankle.  Outpatient Encounter Prescriptions as of 03/06/2012  Medication Sig Dispense Refill  . amLODipine (NORVASC) 10 MG tablet TAKE 1 TABLET BY MOUTH EVERY DAY  30 tablet  3  . aspirin 81 MG tablet Take 81 mg by mouth daily.      . carvedilol (COREG) 12.5 MG tablet TAKE 1 TABLET BY MOUTH TWICE DAILY . NEEDS APPT FOR REFILLS  60 tablet  0  . rosuvastatin (CRESTOR) 10 MG tablet Take 10 mg by mouth daily.      [name] triamterene-hydrochlorothiazide (MAXZIDE) 75-50 MG per tablet Take 1 tablet by mouth daily.      [name] DISCONTD: celecoxib (CELEBREX) 200 MG capsule Take 200 mg by mouth 2 (two) times daily.      [name] DISCONTD: clopidogrel (PLAVIX) 75 MG tablet Take 75 mg by mouth daily.        No Known Allergies  Past Medical History  Diagnosis Date  . History of carotid endarterectomy   . H/O angioplasty   .  HTN (hypertension)   . Exertional dyspnea   . Edema   . Hyperlipidemia     ROS: Negative except as per HPI  BP 134/74  Pulse 59  Ht 6\' 2"  (1.88 m)  Wt 106.142 kg (234 lb)  BMI 30.04 kg/m2  PHYSICAL EXAM: Pt is alert and oriented, NAD HEENT: normal Neck: JVP - normal, carotids 2+= with bilateral bruits Lungs: CTA bilaterally CV: RRR without murmur or gallop Abd: soft, NT, Positive BS, no hepatomegaly Ext: 1+ right ankle edema, trace left ankle edema, femoral pulses 2+ and equal, pedal pulses nonpalpable. Skin: warm/dry no rash  EKG:  Sinus rhythm with sinus arrhythmia, heart rate 59 beats per minute, nonspecific T-wave abnormality.  ASSESSMENT AND PLAN:

## 2012-03-06 NOTE — Assessment & Plan Note (Signed)
ABIs are stable. He has atypical leg pain probably related to his back problems. Note he has had 3 previous back surgeries. Followup in one year with repeat ABIs. Smoking cessation was discussed but he is not interested in quitting.

## 2012-03-06 NOTE — Patient Instructions (Signed)
Your physician wants you to follow-up in: 1 YEAR with Dr Burt Knack.  You will receive a reminder letter in the mail two months in advance. If you don't receive a letter, please call our office to schedule the follow-up appointment.  Your physician has requested that you have an ankle brachial index (ABI) in 1 YEAR. During this test an ultrasound and blood pressure cuff are used to evaluate the arteries that supply the arms and legs with blood. Allow thirty minutes for this exam. There are no restrictions or special instructions.  Your physician recommends that you return for a FASTING LIPID, LIVER and BMP--nothing to eat or drink after midnight, lab opens at 8:30.   Your physician recommends that you continue on your current medications as directed. Please refer to the Current Medication list given to you today.

## 2012-03-06 NOTE — Assessment & Plan Note (Signed)
Carotid disease is followed by Dr. Oneida Alar. He undergoes regular surveillance duplex scans.

## 2012-03-06 NOTE — Assessment & Plan Note (Signed)
Well-controlled on current medications. The patient has a history of chronic kidney disease and I advised him to have followup blood work. I will order this. We'll see him back in one year for followup.

## 2012-03-13 ENCOUNTER — Other Ambulatory Visit (INDEPENDENT_AMBULATORY_CARE_PROVIDER_SITE_OTHER): Payer: Medicare Other

## 2012-03-13 DIAGNOSIS — I70219 Atherosclerosis of native arteries of extremities with intermittent claudication, unspecified extremity: Secondary | ICD-10-CM

## 2012-03-13 DIAGNOSIS — I10 Essential (primary) hypertension: Secondary | ICD-10-CM

## 2012-03-13 DIAGNOSIS — E7800 Pure hypercholesterolemia, unspecified: Secondary | ICD-10-CM

## 2012-03-13 LAB — BASIC METABOLIC PANEL
BUN: 31 mg/dL — ABNORMAL HIGH (ref 6–23)
CO2: 27 mEq/L (ref 19–32)
Calcium: 8.7 mg/dL (ref 8.4–10.5)
Glucose, Bld: 99 mg/dL (ref 70–99)
Potassium: 3.3 mEq/L — ABNORMAL LOW (ref 3.5–5.1)
Sodium: 139 mEq/L (ref 135–145)

## 2012-03-13 LAB — HEPATIC FUNCTION PANEL
AST: 25 U/L (ref 0–37)
Bilirubin, Direct: 0.1 mg/dL (ref 0.0–0.3)
Total Bilirubin: 0.4 mg/dL (ref 0.3–1.2)

## 2012-03-13 LAB — LIPID PANEL
Total CHOL/HDL Ratio: 5
Triglycerides: 341 mg/dL — ABNORMAL HIGH (ref 0.0–149.0)

## 2012-03-19 ENCOUNTER — Other Ambulatory Visit: Payer: Self-pay | Admitting: Cardiovascular Disease

## 2012-03-22 ENCOUNTER — Telehealth: Payer: Self-pay | Admitting: Cardiovascular Disease

## 2012-03-22 NOTE — Telephone Encounter (Signed)
Patient aware of lab work and MD's recommendations. Patient has an appointment for BMET on May 17 th 2013, patient aware.

## 2012-03-22 NOTE — Telephone Encounter (Signed)
Fu call Patient returning your call about test results

## 2012-03-23 ENCOUNTER — Other Ambulatory Visit: Payer: Self-pay | Admitting: *Deleted

## 2012-03-23 DIAGNOSIS — E876 Hypokalemia: Secondary | ICD-10-CM

## 2012-03-23 DIAGNOSIS — N289 Disorder of kidney and ureter, unspecified: Secondary | ICD-10-CM

## 2012-04-20 ENCOUNTER — Other Ambulatory Visit (INDEPENDENT_AMBULATORY_CARE_PROVIDER_SITE_OTHER): Payer: Medicare Other

## 2012-04-20 DIAGNOSIS — N289 Disorder of kidney and ureter, unspecified: Secondary | ICD-10-CM

## 2012-04-20 DIAGNOSIS — E876 Hypokalemia: Secondary | ICD-10-CM

## 2012-04-20 LAB — BASIC METABOLIC PANEL
BUN: 34 mg/dL — ABNORMAL HIGH (ref 6–23)
Chloride: 101 mEq/L (ref 96–112)
Glucose, Bld: 86 mg/dL (ref 70–99)
Potassium: 4.3 mEq/L (ref 3.5–5.1)

## 2012-04-24 ENCOUNTER — Other Ambulatory Visit: Payer: Self-pay

## 2012-04-24 DIAGNOSIS — I739 Peripheral vascular disease, unspecified: Secondary | ICD-10-CM

## 2012-05-17 ENCOUNTER — Encounter (INDEPENDENT_AMBULATORY_CARE_PROVIDER_SITE_OTHER): Payer: Medicare Other

## 2012-05-17 DIAGNOSIS — I739 Peripheral vascular disease, unspecified: Secondary | ICD-10-CM

## 2012-05-17 DIAGNOSIS — N189 Chronic kidney disease, unspecified: Secondary | ICD-10-CM

## 2012-05-17 DIAGNOSIS — I10 Essential (primary) hypertension: Secondary | ICD-10-CM

## 2012-05-17 DIAGNOSIS — I700 Atherosclerosis of aorta: Secondary | ICD-10-CM

## 2012-05-29 ENCOUNTER — Other Ambulatory Visit: Payer: Self-pay | Admitting: Radiology

## 2012-05-29 ENCOUNTER — Other Ambulatory Visit (HOSPITAL_COMMUNITY): Payer: Self-pay | Admitting: Nephrology

## 2012-05-29 DIAGNOSIS — R809 Proteinuria, unspecified: Secondary | ICD-10-CM

## 2012-05-30 ENCOUNTER — Encounter (HOSPITAL_COMMUNITY): Payer: Self-pay | Admitting: Pharmacy Technician

## 2012-05-31 ENCOUNTER — Ambulatory Visit (HOSPITAL_COMMUNITY)
Admission: RE | Admit: 2012-05-31 | Discharge: 2012-05-31 | Disposition: A | Discharge status: Home / Self Care | Payer: Medicare Other | Source: Ambulatory Visit | Attending: Nephrology | Admitting: Nephrology

## 2012-05-31 ENCOUNTER — Encounter (HOSPITAL_COMMUNITY): Payer: Self-pay

## 2012-05-31 VITALS — BP 150/82 | HR 54 | Temp 97.1°F | Resp 16 | Ht 74.0 in | Wt 225.0 lb

## 2012-05-31 DIAGNOSIS — R809 Proteinuria, unspecified: Secondary | ICD-10-CM

## 2012-05-31 DIAGNOSIS — N059 Unspecified nephritic syndrome with unspecified morphologic changes: Secondary | ICD-10-CM | POA: Insufficient documentation

## 2012-05-31 DIAGNOSIS — I10 Essential (primary) hypertension: Secondary | ICD-10-CM | POA: Insufficient documentation

## 2012-05-31 HISTORY — DX: Peripheral vascular disease, unspecified: I73.9

## 2012-05-31 LAB — PROTIME-INR: INR: 1.01 (ref 0.00–1.49)

## 2012-05-31 LAB — CBC
HCT: 41.5 % (ref 39.0–52.0)
Hemoglobin: 14.7 g/dL (ref 13.0–17.0)
RDW: 12.6 % (ref 11.5–15.5)
WBC: 10 10*3/uL (ref 4.0–10.5)

## 2012-05-31 LAB — APTT: aPTT: 33 seconds (ref 24–37)

## 2012-05-31 MED ORDER — HYDROCODONE-ACETAMINOPHEN 5-325 MG PO TABS: 1.0000 | ORAL_TABLET | ORAL | Status: DC | PRN

## 2012-05-31 MED ORDER — MIDAZOLAM HCL 2 MG/2ML IJ SOLN
INTRAMUSCULAR | Status: AC
  Filled 2012-05-31: qty 4

## 2012-05-31 MED ORDER — FENTANYL CITRATE 0.05 MG/ML IJ SOLN
INTRAMUSCULAR | Status: AC | PRN
  Administered 2012-05-31 (×2): 50 ug via INTRAVENOUS

## 2012-05-31 MED ORDER — FENTANYL CITRATE 0.05 MG/ML IJ SOLN
INTRAMUSCULAR | Status: AC
  Filled 2012-05-31: qty 4

## 2012-05-31 MED ORDER — MIDAZOLAM HCL 5 MG/5ML IJ SOLN
INTRAMUSCULAR | Status: AC | PRN
  Administered 2012-05-31 (×2): 1 mg via INTRAVENOUS

## 2012-05-31 MED ORDER — SODIUM CHLORIDE 0.9 % IV SOLN: Freq: Once | INTRAVENOUS | Status: DC

## 2012-05-31 NOTE — Discharge Instructions (Signed)
Kidney Biopsy A biopsy is a test that involves collecting small pieces of tissue, usually through a needle. The tissue is then examined under a microscope. A kidney biopsy can help find a diagnosis and determine the best course of treatment. Your caregiver may recommend a kidney biopsy if you have any of the following conditions:  Hematuria. This is blood in your urine.   Proteinuria. This is when there is excessive protein in your urine.   Impaired kidney function that causes excessive waste products in your blood.  A specialist will look at the kidney tissue samples to check for unusual deposits, scarring, or infecting organisms that would explain your condition. Your caregiver may discover that you have a condition that can be treated and cured. If you have progressive kidney failure, the biopsy may show how quickly the disease is advancing. A biopsy can also help explain why a transplanted kidney is not working properly. Talk with your caregiver about what information might be learned from the biopsy and the risks involved. This can help you make a decision about whether a biopsy is worthwhile in your case. BEFORE THE PROCEDURE  Make sure you understand the need for a biopsy.   Tell your caregiver about any allergies you have and medicines you take.   Avoid food and fluid for 8 hours before the test.   You will have to sign a consent form indicating that you understand the risks involved in this procedure. They are very rare. Discuss these risks thoroughly with your caregiver before you sign the form.   Make sure your caregiver is aware of all the medicines you take and any drug allergies you might have. Shortly before the biopsy, you will give blood and urine samples. This is to make sure you do not have a condition that would suggest not doing a biopsy.  PROCEDURE  Kidney biopsies are usually done in a hospital. You may be fully awake with light sedation or you may be asleep under general  anesthesia. If you are awake, you will be given a local anesthetic before the needle is inserted.   You will lie on your stomach to position the kidneys near the surface of your back. If you have a transplanted kidney, you will lie on your back. The doctor will inject a local painkiller. For a through the skin (percutaneous) biopsy, the doctor will use a locating needle and X-ray or ultrasound equipment to find the right spot and then a collecting needle to gather the tissue. You will be asked to hold your breath as the doctor inserts the biopsy needle and collects the tissue. This is usually for about 30 seconds or a little longer for each insertion. Do not exhale until you are told.   The entire procedure usually takes an hour. This includes time to locate the kidney, clean the biopsy site, inject the local painkiller, and obtain the tissue samples.   Some patients should not have a percutaneous biopsy if they are prone to bleeding problems. These patients may still undergo a kidney biopsy through an open operation. This is when the surgeon makes an incision and can see the kidney to obtain a biopsy.  AFTER THE PROCEDURE  You will lie on your back for 12 to 24 hours. During this time, your back will probably feel sore. If you have a transplanted kidney, you will lie on your stomach. You may stay in the hospital overnight after the procedure so that staff can check your condition.  You may notice some blood in your urine for 24 hours after the test. To detect any problems, your caregivers will:   Monitor your blood pressure and pulse.   Take blood samples to measure the amount of red cells.   Examine the urine that you pass.   On rare occasions when bleeding does not stop on its own, it may be necessary to replace lost blood with a transfusion.   A rare complication is infection from the biopsy procedure.  SEEK MEDICAL CARE IF:  You have bloody urine more than 24 hours after the test.   You  have a fever.   You feel faint or dizzy .   You cannot urinate .   You have worsening pain in the biopsy.  OBTAINING THE TEST RESULTS It is your responsibility to obtain your test results. Ask the lab or department performing the test when and how you will get your results. FOR MORE INFORMATION  [employer]: https://mathis.com/   National Kidney Foundation: www.kidney.org   National Kidney and urologic Diseases Information Clearinghouse: http://kidney.RockToxic.pl  Document Released: 10/01/2004 Document Revised: 11/10/2011 Document Reviewed: 11/08/2010 [facility] Patient Information 2012 Jessie.

## 2012-05-31 NOTE — Procedures (Signed)
[place] R renal 16g core bx x2 No complication Min blood loss. See complete dictation in [facility]

## 2012-05-31 NOTE — H&P (Signed)
Chief Complaint: Proteinuria Referring Physician:Webb HPI: [name] is an 66 y.o. male who is in process of workup for proteinuria. He has been referred and scheduled for US guided random kidney biopsy. He has been well otherwise. No new c/o or illnesses.  Past Medical History:  Past Medical History  Diagnosis Date  . History of carotid endarterectomy   . H/O angioplasty   . HTN (hypertension)   . Exertional dyspnea   . Edema   . Hyperlipidemia   . PAD (peripheral artery disease)     Past Surgical History:  Past Surgical History  Procedure Date  . Lumbar disc surgery     x 2  . Carotid endarterecotomy   . Stnt     lower extermity  . External iliac     status post bilateral and left common iliac artery  . Lumbar back surgery     x2  . Tonsillectomy   . Tendon repair     to the left arm    Family History: No family history on file.  Social History:  reports that he has been smoking 1 pack per day for many years.  He does not have any smokeless tobacco history on file. He reports that he does not drink alcohol. No illicit drug use.  Allergies: No Known Allergies  Medications: aspirin 325 MG tablet (Taking) 03/06/2012 Sig - Route: Take 325 mg by mouth every evening. -  rosuvastatin (CRESTOR) 10 MG tablet (Taking) Sig - Route: Take 10 mg by mouth every evening. -  triamterene-hydrochlorothiazide (MAXZIDE) 75-50 MG per tablet (Taking) Sig - Route: Take 1 tablet by mouth daily. -amLODipine (NORVASC) 10 MG tablet (Taking) Sig - Route: Take 10 mg by mouth daily. - Oral predniSONE (DELTASONE) 20 MG tablet (Taking) Sig - Route: Take 20 mg by mouth daily. -   Review of Systems - History obtained from the patient General ROS: negative ENT ROS: negative Respiratory ROS: no cough, shortness of breath, or wheezing Cardiovascular ROS: no chest pain or dyspnea on exertion Gastrointestinal ROS: no abdominal pain, change in bowel habits, or black or bloody stools Genito-Urinary  ROS: no dysuria, trouble voiding, or hematuria Musculoskeletal ROS: negative Neurological ROS: no TIA or stroke symptoms   Physical Exam: Blood pressure 154/81, pulse 59, temperature 97.1 F (36.2 C), temperature source Oral, resp. rate 18, height 6\' 2"  (1.88 m), weight 225 lb (102.059 kg), SpO2 99.00%. Body mass index is 28.89 kg/(m^2).   General Appearance:  Alert, cooperative, no distress, appears stated age  Head:  Normocephalic, without obvious abnormality, atraumatic  ENT: Unremarkable  Neck: Supple, symmetrical, trachea midline, no adenopathy, thyroid: not enlarged, symmetric, no tenderness/mass/nodules  Lungs:   Clear to auscultation bilaterally, no w/r/r, respirations unlabored without use of accessory muscles.  Heart:  Regular rate and rhythm, S1, S2 normal, no murmur, rub or gallop. Carotids 2+ without bruit.  Abdomen:   Soft, non-tender, non distended. Bowel sounds active all four quadrants,  no masses, no organomegaly.  Extremities: Extremities normal, atraumatic, no cyanosis or edema  Neurologic: Normal affect, no gross deficits.   Results for orders placed during the hospital encounter of 05/31/12 (from the past 48 hour(s))  CBC     Status: Normal   Collection Time   05/31/12  8:56 AM      Component Value Range Comment   WBC 10.0  4.0 - 10.5 K/uL    RBC 4.82  4.22 - 5.81 MIL/uL    Hemoglobin 14.7  13.0 - 17.0  g/dL    HCT 41.5  39.0 - 52.0 %    MCV 86.1  78.0 - 100.0 fL    MCH 30.5  26.0 - 34.0 pg    MCHC 35.4  30.0 - 36.0 g/dL    RDW 12.6  11.5 - 15.5 %    Platelets 195  150 - 400 K/uL    No results found.  Assessment/Plan Proteinuria. For US guided renal biopsy. Discussed procedure with pt and wife including risks and complications. Consent signed in chart Await remaining labs.  Ascencion Dike PA-C 05/31/2012, 9:23 AM

## 2012-06-03 ENCOUNTER — Other Ambulatory Visit: Payer: Self-pay | Admitting: Cardiovascular Disease

## 2012-06-18 ENCOUNTER — Ambulatory Visit
Admission: RE | Admit: 2012-06-18 | Discharge: 2012-06-18 | Disposition: A | Discharge status: Home / Self Care | Payer: Medicare Other | Source: Ambulatory Visit | Attending: Nephrology | Admitting: Nephrology

## 2012-06-18 ENCOUNTER — Other Ambulatory Visit: Payer: Self-pay | Admitting: Nephrology

## 2012-06-18 DIAGNOSIS — R05 Cough: Secondary | ICD-10-CM

## 2012-06-18 DIAGNOSIS — R059 Cough, unspecified: Secondary | ICD-10-CM

## 2012-07-03 ENCOUNTER — Encounter: Payer: Self-pay | Admitting: Cardiovascular Disease

## 2012-09-30 ENCOUNTER — Other Ambulatory Visit: Payer: Self-pay | Admitting: Cardiovascular Disease

## 2012-10-30 ENCOUNTER — Other Ambulatory Visit: Payer: Self-pay | Admitting: Cardiovascular Disease

## 2012-11-19 ENCOUNTER — Encounter: Payer: Self-pay | Admitting: Cardiovascular Disease

## 2013-02-05 DIAGNOSIS — F17200 Nicotine dependence, unspecified, uncomplicated: Secondary | ICD-10-CM | POA: Insufficient documentation

## 2013-02-07 ENCOUNTER — Encounter: Payer: Self-pay | Admitting: Cardiovascular Disease

## 2013-02-10 ENCOUNTER — Other Ambulatory Visit: Payer: Self-pay | Admitting: Cardiovascular Disease

## 2013-03-29 ENCOUNTER — Encounter: Payer: Self-pay | Admitting: Cardiovascular Disease

## 2013-05-15 DIAGNOSIS — N184 Chronic kidney disease, stage 4 (severe): Secondary | ICD-10-CM | POA: Insufficient documentation

## 2013-05-15 DIAGNOSIS — E785 Hyperlipidemia, unspecified: Secondary | ICD-10-CM | POA: Insufficient documentation

## 2013-05-15 DIAGNOSIS — K219 Gastro-esophageal reflux disease without esophagitis: Secondary | ICD-10-CM | POA: Insufficient documentation

## 2013-05-15 DIAGNOSIS — Z8673 Personal history of transient ischemic attack (TIA), and cerebral infarction without residual deficits: Secondary | ICD-10-CM | POA: Insufficient documentation

## 2013-05-15 DIAGNOSIS — J439 Emphysema, unspecified: Secondary | ICD-10-CM | POA: Insufficient documentation

## 2013-06-12 ENCOUNTER — Other Ambulatory Visit: Payer: Self-pay | Admitting: Cardiovascular Disease

## 2013-06-17 DIAGNOSIS — J189 Pneumonia, unspecified organism: Secondary | ICD-10-CM | POA: Insufficient documentation

## 2013-06-17 DIAGNOSIS — R0902 Hypoxemia: Secondary | ICD-10-CM | POA: Insufficient documentation

## 2013-06-17 DIAGNOSIS — K5730 Diverticulosis of large intestine without perforation or abscess without bleeding: Secondary | ICD-10-CM | POA: Insufficient documentation

## 2013-06-17 DIAGNOSIS — M7730 Calcaneal spur, unspecified foot: Secondary | ICD-10-CM | POA: Insufficient documentation

## 2013-06-17 DIAGNOSIS — K648 Other hemorrhoids: Secondary | ICD-10-CM | POA: Insufficient documentation

## 2013-06-17 DIAGNOSIS — K620 Anal polyp: Secondary | ICD-10-CM | POA: Insufficient documentation

## 2013-06-17 DIAGNOSIS — I739 Peripheral vascular disease, unspecified: Secondary | ICD-10-CM | POA: Insufficient documentation

## 2013-06-17 DIAGNOSIS — M25579 Pain in unspecified ankle and joints of unspecified foot: Secondary | ICD-10-CM | POA: Insufficient documentation

## 2013-06-17 DIAGNOSIS — Z8673 Personal history of transient ischemic attack (TIA), and cerebral infarction without residual deficits: Secondary | ICD-10-CM | POA: Insufficient documentation

## 2013-06-17 DIAGNOSIS — K4030 Unilateral inguinal hernia, with obstruction, without gangrene, not specified as recurrent: Secondary | ICD-10-CM | POA: Insufficient documentation

## 2013-07-11 ENCOUNTER — Other Ambulatory Visit: Payer: Self-pay | Admitting: Cardiovascular Disease

## 2013-07-11 NOTE — Telephone Encounter (Signed)
..  Patient needs to contact office to schedule  Appointment  for future refills.Ph:[phone]. Thank you.

## 2013-08-21 DIAGNOSIS — G8929 Other chronic pain: Secondary | ICD-10-CM | POA: Insufficient documentation

## 2013-09-11 ENCOUNTER — Other Ambulatory Visit: Payer: Self-pay | Admitting: Cardiovascular Disease

## 2013-10-10 ENCOUNTER — Other Ambulatory Visit: Payer: Self-pay | Admitting: Cardiovascular Disease

## 2013-11-08 ENCOUNTER — Other Ambulatory Visit: Payer: Self-pay | Admitting: Cardiovascular Disease

## 2013-11-24 ENCOUNTER — Other Ambulatory Visit: Payer: Self-pay | Admitting: Cardiovascular Disease

## 2013-12-06 DIAGNOSIS — M25579 Pain in unspecified ankle and joints of unspecified foot: Secondary | ICD-10-CM | POA: Diagnosis not present

## 2013-12-06 DIAGNOSIS — R918 Other nonspecific abnormal finding of lung field: Secondary | ICD-10-CM | POA: Diagnosis not present

## 2013-12-06 DIAGNOSIS — E785 Hyperlipidemia, unspecified: Secondary | ICD-10-CM | POA: Diagnosis not present

## 2013-12-06 DIAGNOSIS — I129 Hypertensive chronic kidney disease with stage 1 through stage 4 chronic kidney disease, or unspecified chronic kidney disease: Secondary | ICD-10-CM | POA: Diagnosis not present

## 2013-12-06 DIAGNOSIS — R0902 Hypoxemia: Secondary | ICD-10-CM | POA: Diagnosis not present

## 2013-12-06 DIAGNOSIS — I10 Essential (primary) hypertension: Secondary | ICD-10-CM | POA: Diagnosis not present

## 2013-12-06 DIAGNOSIS — J441 Chronic obstructive pulmonary disease with (acute) exacerbation: Secondary | ICD-10-CM | POA: Diagnosis not present

## 2013-12-06 DIAGNOSIS — N189 Chronic kidney disease, unspecified: Secondary | ICD-10-CM | POA: Diagnosis not present

## 2013-12-06 DIAGNOSIS — F17200 Nicotine dependence, unspecified, uncomplicated: Secondary | ICD-10-CM | POA: Diagnosis not present

## 2013-12-06 DIAGNOSIS — I739 Peripheral vascular disease, unspecified: Secondary | ICD-10-CM | POA: Diagnosis not present

## 2013-12-06 DIAGNOSIS — M1990 Unspecified osteoarthritis, unspecified site: Secondary | ICD-10-CM | POA: Diagnosis not present

## 2013-12-06 DIAGNOSIS — Z8673 Personal history of transient ischemic attack (TIA), and cerebral infarction without residual deficits: Secondary | ICD-10-CM | POA: Diagnosis not present

## 2013-12-06 DIAGNOSIS — K219 Gastro-esophageal reflux disease without esophagitis: Secondary | ICD-10-CM | POA: Diagnosis not present

## 2013-12-06 DIAGNOSIS — M7730 Calcaneal spur, unspecified foot: Secondary | ICD-10-CM | POA: Diagnosis not present

## 2013-12-06 DIAGNOSIS — R634 Abnormal weight loss: Secondary | ICD-10-CM | POA: Diagnosis not present

## 2013-12-06 DIAGNOSIS — K648 Other hemorrhoids: Secondary | ICD-10-CM | POA: Diagnosis not present

## 2013-12-06 DIAGNOSIS — J189 Pneumonia, unspecified organism: Secondary | ICD-10-CM | POA: Diagnosis not present

## 2013-12-06 DIAGNOSIS — R0602 Shortness of breath: Secondary | ICD-10-CM | POA: Diagnosis not present

## 2014-02-04 DIAGNOSIS — I10 Essential (primary) hypertension: Secondary | ICD-10-CM | POA: Diagnosis not present

## 2014-03-18 DIAGNOSIS — N529 Male erectile dysfunction, unspecified: Secondary | ICD-10-CM | POA: Diagnosis not present

## 2014-03-18 DIAGNOSIS — B9689 Other specified bacterial agents as the cause of diseases classified elsewhere: Secondary | ICD-10-CM | POA: Diagnosis not present

## 2014-03-18 DIAGNOSIS — A499 Bacterial infection, unspecified: Secondary | ICD-10-CM | POA: Diagnosis not present

## 2014-03-18 DIAGNOSIS — F17200 Nicotine dependence, unspecified, uncomplicated: Secondary | ICD-10-CM | POA: Diagnosis not present

## 2014-03-18 DIAGNOSIS — G8929 Other chronic pain: Secondary | ICD-10-CM | POA: Diagnosis not present

## 2014-04-08 DIAGNOSIS — J329 Chronic sinusitis, unspecified: Secondary | ICD-10-CM | POA: Diagnosis not present

## 2014-04-08 DIAGNOSIS — H109 Unspecified conjunctivitis: Secondary | ICD-10-CM | POA: Diagnosis not present

## 2014-04-08 DIAGNOSIS — R059 Cough, unspecified: Secondary | ICD-10-CM | POA: Diagnosis not present

## 2014-04-08 DIAGNOSIS — J3489 Other specified disorders of nose and nasal sinuses: Secondary | ICD-10-CM | POA: Diagnosis not present

## 2014-04-08 DIAGNOSIS — Z1331 Encounter for screening for depression: Secondary | ICD-10-CM | POA: Insufficient documentation

## 2014-04-08 DIAGNOSIS — R05 Cough: Secondary | ICD-10-CM | POA: Diagnosis not present

## 2014-04-30 DIAGNOSIS — J45909 Unspecified asthma, uncomplicated: Secondary | ICD-10-CM | POA: Diagnosis not present

## 2014-04-30 DIAGNOSIS — J329 Chronic sinusitis, unspecified: Secondary | ICD-10-CM | POA: Diagnosis not present

## 2014-07-22 DIAGNOSIS — D631 Anemia in chronic kidney disease: Secondary | ICD-10-CM | POA: Diagnosis not present

## 2014-07-22 DIAGNOSIS — N189 Chronic kidney disease, unspecified: Secondary | ICD-10-CM | POA: Diagnosis not present

## 2014-07-22 DIAGNOSIS — N055 Unspecified nephritic syndrome with diffuse mesangiocapillary glomerulonephritis: Secondary | ICD-10-CM | POA: Diagnosis not present

## 2014-07-22 DIAGNOSIS — E785 Hyperlipidemia, unspecified: Secondary | ICD-10-CM | POA: Diagnosis not present

## 2014-09-09 DIAGNOSIS — D631 Anemia in chronic kidney disease: Secondary | ICD-10-CM | POA: Diagnosis not present

## 2014-09-09 DIAGNOSIS — N045 Nephrotic syndrome with diffuse mesangiocapillary glomerulonephritis: Secondary | ICD-10-CM | POA: Diagnosis not present

## 2014-09-09 DIAGNOSIS — E785 Hyperlipidemia, unspecified: Secondary | ICD-10-CM | POA: Diagnosis not present

## 2014-09-09 DIAGNOSIS — N189 Chronic kidney disease, unspecified: Secondary | ICD-10-CM | POA: Diagnosis not present

## 2014-09-16 DIAGNOSIS — G8929 Other chronic pain: Secondary | ICD-10-CM | POA: Diagnosis not present

## 2014-09-16 DIAGNOSIS — N189 Chronic kidney disease, unspecified: Secondary | ICD-10-CM | POA: Diagnosis not present

## 2014-09-16 DIAGNOSIS — I10 Essential (primary) hypertension: Secondary | ICD-10-CM | POA: Diagnosis not present

## 2014-09-16 DIAGNOSIS — Z2821 Immunization not carried out because of patient refusal: Secondary | ICD-10-CM | POA: Insufficient documentation

## 2014-09-16 DIAGNOSIS — Z0000 Encounter for general adult medical examination without abnormal findings: Secondary | ICD-10-CM | POA: Diagnosis not present

## 2014-09-16 DIAGNOSIS — Z23 Encounter for immunization: Secondary | ICD-10-CM | POA: Diagnosis not present

## 2014-09-16 DIAGNOSIS — E785 Hyperlipidemia, unspecified: Secondary | ICD-10-CM | POA: Diagnosis not present

## 2014-12-16 DIAGNOSIS — N181 Chronic kidney disease, stage 1: Secondary | ICD-10-CM | POA: Diagnosis not present

## 2014-12-16 DIAGNOSIS — G8929 Other chronic pain: Secondary | ICD-10-CM | POA: Diagnosis not present

## 2014-12-24 DIAGNOSIS — M10371 Gout due to renal impairment, right ankle and foot: Secondary | ICD-10-CM | POA: Diagnosis not present

## 2015-01-13 DIAGNOSIS — M10071 Idiopathic gout, right ankle and foot: Secondary | ICD-10-CM | POA: Diagnosis not present

## 2015-01-23 DIAGNOSIS — I739 Peripheral vascular disease, unspecified: Secondary | ICD-10-CM | POA: Diagnosis not present

## 2015-01-23 DIAGNOSIS — L03031 Cellulitis of right toe: Secondary | ICD-10-CM | POA: Diagnosis not present

## 2015-01-23 DIAGNOSIS — I779 Disorder of arteries and arterioles, unspecified: Secondary | ICD-10-CM | POA: Diagnosis not present

## 2015-01-23 DIAGNOSIS — J439 Emphysema, unspecified: Secondary | ICD-10-CM | POA: Diagnosis not present

## 2015-01-28 ENCOUNTER — Encounter: Payer: Self-pay | Admitting: Vascular Surgery

## 2015-01-28 ENCOUNTER — Other Ambulatory Visit: Payer: Self-pay | Admitting: *Deleted

## 2015-01-28 DIAGNOSIS — I6523 Occlusion and stenosis of bilateral carotid arteries: Secondary | ICD-10-CM

## 2015-01-28 DIAGNOSIS — I7092 Chronic total occlusion of artery of the extremities: Secondary | ICD-10-CM

## 2015-01-28 DIAGNOSIS — I739 Peripheral vascular disease, unspecified: Secondary | ICD-10-CM

## 2015-01-29 ENCOUNTER — Ambulatory Visit (INDEPENDENT_AMBULATORY_CARE_PROVIDER_SITE_OTHER): Payer: Medicare Other | Admitting: Vascular Surgery

## 2015-01-29 ENCOUNTER — Telehealth: Payer: Self-pay | Admitting: Cardiovascular Disease

## 2015-01-29 ENCOUNTER — Encounter: Payer: Self-pay | Admitting: Vascular Surgery

## 2015-01-29 ENCOUNTER — Other Ambulatory Visit: Payer: Self-pay

## 2015-01-29 ENCOUNTER — Ambulatory Visit (INDEPENDENT_AMBULATORY_CARE_PROVIDER_SITE_OTHER)
Admission: RE | Admit: 2015-01-29 | Discharge: 2015-01-29 | Disposition: A | Discharge status: Home / Self Care | Payer: Medicare Other | Source: Ambulatory Visit | Attending: Vascular Surgery | Admitting: Vascular Surgery

## 2015-01-29 ENCOUNTER — Ambulatory Visit (HOSPITAL_COMMUNITY)
Admission: RE | Admit: 2015-01-29 | Discharge: 2015-01-29 | Disposition: A | Discharge status: Home / Self Care | Payer: Medicare Other | Source: Ambulatory Visit | Attending: Vascular Surgery | Admitting: Vascular Surgery

## 2015-01-29 VITALS — BP 134/67 | HR 51 | Ht 74.0 in | Wt 216.0 lb

## 2015-01-29 DIAGNOSIS — I6523 Occlusion and stenosis of bilateral carotid arteries: Secondary | ICD-10-CM | POA: Diagnosis not present

## 2015-01-29 DIAGNOSIS — I779 Disorder of arteries and arterioles, unspecified: Secondary | ICD-10-CM | POA: Insufficient documentation

## 2015-01-29 DIAGNOSIS — F17200 Nicotine dependence, unspecified, uncomplicated: Secondary | ICD-10-CM | POA: Diagnosis not present

## 2015-01-29 DIAGNOSIS — I7092 Chronic total occlusion of artery of the extremities: Secondary | ICD-10-CM

## 2015-01-29 DIAGNOSIS — I998 Other disorder of circulatory system: Secondary | ICD-10-CM | POA: Diagnosis not present

## 2015-01-29 NOTE — Telephone Encounter (Signed)
Called and spoke with Sharyn Lull and informed her that unfortunately Dr. Burt Knack is not in the office between now and the time for the pt's surgery. Informed her that I would do my best to get him in to see a MD, PA or NP. Sharyn Lull verbalized understanding and was in agreement with this plan. Called and spoke with pt in regards to scheduling an appt. Informed pt there was an opening on Monday to see Dr. Harrington Challenger at [facility] but that I would need to speak to [facility] to get this appt approved. Informed pt that I will call him in the morning to let him know if this appt is available. Pt verbalized understanding and was in agreement with this plan.

## 2015-01-29 NOTE — Progress Notes (Signed)
MRN : [id number]  Reason for Consult: Right great toe discoloration, Carotid stenosis s/p left CEA/stent 2007/2011 Referring Physician: Dr. Raelene Bott  History of Present Illness: 69 y/o male who has had a left CEA in 2007 followed by left carotid stent in 2011 by Dr. Oneida Alar is here for a carotid follow up visit.  He reports no amaurosis, no weakness in either extremities, and no slurred speech. of stroke or TIA.     He also has a history of PAD and has had Right SFA and external iliac stents, left SFA stent which is chronically occluded, left external iliac and left common iliac stents all placed by Dr. Albertine Patricia years ago.  He reports having symptoms of claudication.  He can walk so far and he gets numbness and pain in the right foot.  He will stop and rest for 90 seconds and the numbness goes away and he can walk more.  He had a bout of gout 3 weeks ago that lead to right great toe pain and now the tip or the toes is dark and cool to touch.  No ulcers or drainage.    His past medical history includes TIA, COPD, hypertension, hypercholesterolemia and chronic kidney disease followed by Dr. Justin Mend.  His Cardiologist is Dr. Burt Knack.  He has an allergy to Plavix and takes a full strength Asprin daily, a Statin, and a beta blocker.     Current Outpatient Prescriptions  Medication Sig Dispense Refill  . amLODipine (NORVASC) 10 MG tablet TAKE 1 TABLET BY MOUTH EVERY DAY. 15 tablet 0  . amoxicillin-clavulanate (AUGMENTIN) 875-125 MG per tablet Take 1 tablet by mouth daily.     [name] aspirin 325 MG tablet Take 325 mg by mouth every evening.     . carvedilol (COREG) 12.5 MG tablet TAKE 1 TABLET BY MOUTH TWICE DAILY WITH MEALS. 30 tablet 0  . mycophenolate (CELLCEPT) 250 MG capsule Take 250 mg by mouth 4 (four) times daily.     [name] omeprazole (PRILOSEC) 20 MG capsule TAKE ONE CAPSULE BY MOUTH EVERY DAY AT 6AM    . oxyCODONE-acetaminophen (PERCOCET) 7.5-325 MG per tablet Take 1 tablet by mouth every 6 (six)  hours as needed.     . rosuvastatin (CRESTOR) 10 MG tablet Take 10 mg by mouth every evening.     . triamterene-hydrochlorothiazide (MAXZIDE) 75-50 MG per tablet Take 1 tablet by mouth daily.    [name] loratadine (CLARITIN) 10 MG tablet Take 10 mg by mouth daily.    . predniSONE (DELTASONE) 20 MG tablet Take 20 mg by mouth daily.     No current facility-administered medications for this visit.    Pt meds include: Statin :Yes Betablocker: Yes ASA: Yes Other anticoagulants/antiplatelets: none  Past Medical History  Diagnosis Date  . History of carotid endarterectomy   . H/O angioplasty   . HTN (hypertension)   . Exertional dyspnea   . Edema   . Hyperlipidemia   . PAD (peripheral artery disease)   . Stroke   . Chronic kidney disease     Past Surgical History  Procedure Laterality Date  . Lumbar disc surgery      x 2  . Carotid endarterecotomy    . Stnt      lower extermity  . External iliac      status post bilateral and left common iliac artery  . Lumbar back surgery      x2  . Tonsillectomy    . Tendon repair  to the left arm    Social History History  Substance Use Topics  . Smoking status: Current Every Day Smoker -- 1.00 packs/day for 50 years    Types: Cigarettes  . Smokeless tobacco: Not on file  . Alcohol Use: 4.2 oz/week    7 Glasses of wine per week    Family History Family History  Problem Relation Age of Onset  . Hypertension Mother   . Hyperlipidemia Mother     Allergies  Allergen Reactions  . Clopidogrel Rash    (plavix)     REVIEW OF SYSTEMS  General: [ ]  Weight loss, [ ]  Fever, [ ]  chills Neurologic: [ ]  Dizziness, [ ]  Blackouts, [ ]  Seizure [ ]  Stroke, [ ]  "Mini stroke", [ ]  Slurred speech, [ ]  Temporary blindness; [ ]  weakness in arms or legs, [ ]  Hoarseness [ ]  Dysphagia Cardiac: [ ]  Chest pain/pressure, [ ]  Shortness of breath at rest [x ] Shortness of breath with exertion, [ ]  Atrial fibrillation or irregular heartbeat    Vascular: [x ] Pain in legs with walking, [ ]  Pain in legs at rest, [ ]  Pain in legs at night,  [ ]  Non-healing ulcer, [ ]  Blood clot in vein/DVT,   Pulmonary: [ ]  Home oxygen, [ ]  Productive cough, [ ]  Coughing up blood, [ ]  Asthma,  [x ] Wheezing [x ] COPD Musculoskeletal:  [ ]  Arthritis, [ ]  Low back pain, [ ]  Joint pain Hematologic: [ ]  Easy Bruising, [ ]  Anemia; [ ]  Hepatitis Gastrointestinal: [ ]  Blood in stool, [ ]  Gastroesophageal Reflux/heartburn, Urinary: [x ] chronic Kidney disease, [ ]  on HD - [ ]  MWF or [ ]  TTHS, [ ]  Burning with urination, [ ]  Difficulty urinating Skin: [ ]  Rashes, [x ] Wounds Psychological: [ ]  Anxiety, [ ]  Depression  Physical Examination Filed Vitals:   01/29/15 1354 01/29/15 1355  BP: 138/63 134/67  Pulse: 51   Height: 6\' 2"  (1.88 m)   Weight: 216 lb (97.977 kg)   SpO2: 99%    Body mass index is 27.72 kg/(m^2).  General:  WDWN in NAD Gait: Normal HENT: WNL Eyes: Pupils equal Pulmonary: normal non-labored breathing , without Rales, rhonchi, expiratory  wheezing Cardiac: RRR, without  Murmurs, rubs or gallops; No carotid bruits Abdomen: soft, NT, no masses Skin: no rashes, ulcers noted;  no Gangrene , no cellulitis; no open wounds;  Right great toe tip dusky and cool to touch  Vascular Exam/Pulses:Radial pulses palpable,2+ left femoral, absent right femoral popliteals and DP/PT   Musculoskeletal: no muscle wasting or atrophy; no edema  Neurologic: A&O X 3; Appropriate Affect ;  SENSATION: normal; MOTOR FUNCTION: 5/5 Symmetric Speech is fluent/normal     Non-Invasive Vascular Imaging:  ABI right 0.78 monophasic flow Left 0.53 monophasic flow   Carotid duplex Right < 40% Left 40-59% Left common carotid stent is patent   ASSESSMENT/PLAN:  Carotid stenosis s/p CEA and common carotid stent left stable PAD bilateral with ischemic right great toe changes We will send him to Dr. Burt Knack for cardiac clearance and set him up for an  angiogram with bilateral run off by Dr. Trula Slade on March 2nd.  We will need lab work to check his Kidney function and determine if we can do the angiogram with contrast.       Laurence Slate [facility] 01/29/2015 2:36 PM  The patient was seen in clinic today in conjunction with Dr. Oneida Alar.   History and exam findings as above.  The patient has shortness distance claudication in the right leg. He has previous he had bilateral SFA stents and multiple iliac stents by Dr. Albertine Patricia in the past. He now has an ischemic right first toe.  He likely will require a right leg bypass in order to improve perfusion to heal the toe. He will be scheduled for cardiac risk stratification and a lower extremity arteriogram. Hopefully his kidney function will be reasonable for that. His creatinine has been as high as 2 in the past. We may have to use CO2 contrast for his study combined with limited contrast.  Ruta Hinds, MD [facility]: [phone] Pager: [phone]

## 2015-01-29 NOTE — Telephone Encounter (Signed)
New Msg        Michelle calling from [facility]. Pt has an angiogram scheduled for 02-04-15 and needs to be seen immediately.   Can anything be done to assist? Pt hasn't seen Dr. Burt Knack in the past year.   Please return call to [place] at [phone].

## 2015-01-30 NOTE — Telephone Encounter (Signed)
Pt scheduled for appointment 02/02/15.

## 2015-02-02 ENCOUNTER — Encounter: Payer: Self-pay | Admitting: Nurse Practitioner

## 2015-02-02 ENCOUNTER — Ambulatory Visit (INDEPENDENT_AMBULATORY_CARE_PROVIDER_SITE_OTHER): Payer: Medicare Other | Admitting: Nurse Practitioner

## 2015-02-02 VITALS — BP 120/84 | HR 62 | Ht 74.0 in | Wt 219.4 lb

## 2015-02-02 DIAGNOSIS — N183 Chronic kidney disease, stage 3 unspecified: Secondary | ICD-10-CM

## 2015-02-02 DIAGNOSIS — I10 Essential (primary) hypertension: Secondary | ICD-10-CM

## 2015-02-02 DIAGNOSIS — I6522 Occlusion and stenosis of left carotid artery: Secondary | ICD-10-CM | POA: Insufficient documentation

## 2015-02-02 DIAGNOSIS — I7092 Chronic total occlusion of artery of the extremities: Secondary | ICD-10-CM | POA: Diagnosis not present

## 2015-02-02 DIAGNOSIS — E785 Hyperlipidemia, unspecified: Secondary | ICD-10-CM

## 2015-02-02 DIAGNOSIS — I739 Peripheral vascular disease, unspecified: Secondary | ICD-10-CM | POA: Diagnosis not present

## 2015-02-02 DIAGNOSIS — R0609 Other forms of dyspnea: Secondary | ICD-10-CM

## 2015-02-02 DIAGNOSIS — R0600 Dyspnea, unspecified: Secondary | ICD-10-CM | POA: Insufficient documentation

## 2015-02-02 NOTE — Progress Notes (Signed)
Patient Name: [name] Date of Encounter: 02/02/2015  Primary Care Provider:  Raelene Bott, MD Primary Cardiologist:  Jerilynn Mages. Burt Knack, MD   Chief Complaint  69 year old male with a history of peripheral arterial disease pending repeat angiography and possible right lower extremity surgery who presents today for preoperative evaluation.  Past Medical History   Past Medical History  Diagnosis Date  . Left carotid stenosis     a. 2007 s/p L CEA;  b. 2011 s/p L common carotid stenting 2/2 restenosis;  c. [id number] u/s: RICA [id number], LICA [id number], patent LCCA stent.  [name] HTN (hypertension)   . Edema   . Hyperlipidemia   . PAD (peripheral artery disease)     a. s/p prior RSFA, REIA, distal LSFA (known to be occluded), LEIA, and LCIA stenting;  b. 01/2015 ABI's: R 0.78, L 0.53.  [name] Stroke   . CKD (chronic kidney disease), stage III   . Dyspnea on exertion    Past Surgical History  Procedure Laterality Date  . Lumbar disc surgery      x 2  . Carotid endarterecotomy    . Stnt      lower extermity  . External iliac      status post bilateral and left common iliac artery  . Lumbar back surgery      x2  . Tonsillectomy    . Tendon repair      to the left arm    Allergies  Allergies  Allergen Reactions  . Clopidogrel Rash    (plavix)    HPI  69 year old male with the above complex problem list. Is a long history of peripheral arterial disease and lower extremity claudication. He has no prior cardiac history however. Over the past year or more, he has had progressive right lower extremity claudication and pain with blackening of the right great toe over the past few months. He says he can walk about 40 yards prior to experiencing severe claudication. He also expresses dyspnea on exertion after walking about 30-40 yards but has never had any chest pain. He denies PND, orthopnea, dizziness, syncope, or early satiety. He occasionally has lower extremity edema. Because of progressive  claudication and changes to the right great toe, he recently saw vascular surgery. Arterial brachial indices were performed. These were abnormal and there is now concern that he may require right-sided bypass related to poor flow and ischemic right first toe. He was set up for cardiac preoperative evaluation today.  Home Medications  Prior to Admission medications   Medication Sig Start Date End Date Taking? Authorizing Provider  amLODipine (NORVASC) 10 MG tablet TAKE 1 TABLET BY MOUTH EVERY DAY. 11/08/13  Yes Blane Ohara, MD  aspirin 325 MG tablet Take 325 mg by mouth every evening.  03/06/12  Yes Blane Ohara, MD  carvedilol (COREG) 12.5 MG tablet TAKE 1 TABLET BY MOUTH TWICE DAILY WITH MEALS. 11/08/13  Yes Blane Ohara, MD  loratadine (CLARITIN) 10 MG tablet Take 10 mg by mouth daily.   Yes Historical Provider, MD  mycophenolate (CELLCEPT) 250 MG capsule Take 250 mg by mouth 4 (four) times daily.  09/17/13  Yes Historical Provider, MD  omeprazole (PRILOSEC) 20 MG capsule TAKE ONE CAPSULE BY MOUTH EVERY DAY AT 6AM 11/30/14  Yes Historical Provider, MD  oxyCODONE-acetaminophen (PERCOCET) 7.5-325 MG per tablet Take 1 tablet by mouth every 6 (six) hours as needed.  12/30/14 12/30/15 Yes Historical Provider, MD  rosuvastatin (CRESTOR) 10 MG tablet Take 10 mg  by mouth every evening.    Yes Historical Provider, MD  triamterene-hydrochlorothiazide (MAXZIDE) 75-50 MG per tablet Take 1 tablet by mouth daily.   Yes Historical Provider, MD    Review of Systems  Activity is very limited, as he is only able to walk about 40 hours prior to expressing dyspnea on exertion and right greater than left lower extremity claudication. He denies PND, orthopnea, dizziness, syncope, or early satiety. He has never had chest pain. He does occasionally have right greater than left lower extremity edema.  All other systems reviewed and are otherwise negative except as noted above.  Physical Exam  VS:  BP 120/84  mmHg  Pulse 62  Ht 6\' 2"  (1.88 m)  Wt 219 lb 6.4 oz (99.519 kg)  BMI 28.16 kg/m2 , BMI Body mass index is 28.16 kg/(m^2). GEN: Well nourished, well developed, in no acute distress. HEENT: normal. Neck: Supple, no JVD, carotid bruits, or masses. Cardiac: RRR, no murmurs, rubs, or gallops. No clubbing, cyanosis, edema.  DP and PT pulses are absent bilaterally. Respiratory:  Respirations regular and unlabored, clear to auscultation bilaterally. GI: Soft, nontender, nondistended, BS + x 4. MS: no deformity or atrophy. Skin: warm and dry, no rash. Neuro:  Strength and sensation are intact. Psych: Normal affect.  Accessory Clinical Findings  ECG - regular sinus rhythm, 62, PAC, nonspecific T changes. No acute changes.   Assessment & Plan  1.  Dyspnea on exertion: Patient has a long history of dyspnea on exertion after walking only about 40 yards. His activity is primarily limited by lower extremity claudication and he is currently undergoing evaluation by vascular surgery with plans for angiography later this week and concerned that he may require lower extremity bypass. His multiple risk factors for coronary artery disease including peripheral arterial disease, hypertension, hyperlipidemia, age, and ongoing tobacco abuse. In light of possible pending surgery, I will arrange for a lexiscan Cardiolite to assess for ischemia. This has been scheduled for tomorrow. He remains on aspirin, beta blocker, and statin therapy. Heart rate and blood pressure well controlled.  2. Peripheral arterial disease with claudication and ischemic right great toe: As above, currently undergoing evaluation by vascular surgery with plans for angiography later this week.  3. Hypertension: Stable on beta blocker, calcium channel blocker, and diuretic.  4. Hyperlipidemia: Continue statin therapy.  5. Ongoing tobacco abuse: Smoking cessation strongly advised. Patient is not motivated to quit at this time.  6. Stage III  chronic kidney disease: Preprocedure labs to be drawn later this week.  7. Disposition: Await stress testing results.   Murray Hodgkins, NP 02/02/2015, 12:26 PM

## 2015-02-02 NOTE — Patient Instructions (Signed)
Your physician recommends that you continue on your current medications as directed. Please refer to the Current Medication list given to you today.  Your physician has requested that you have a lexiscan myoview. For further information please visit [email or web address]. Please follow instruction sheet, as given. ( To be scheduled ASAP)  Your physician recommends that you schedule a follow-up appointment pending results

## 2015-02-03 ENCOUNTER — Ambulatory Visit (HOSPITAL_COMMUNITY): Payer: Medicare Other | Attending: Cardiology | Admitting: Radiology

## 2015-02-03 DIAGNOSIS — R0600 Dyspnea, unspecified: Secondary | ICD-10-CM

## 2015-02-03 DIAGNOSIS — R0609 Other forms of dyspnea: Secondary | ICD-10-CM

## 2015-02-03 MED ORDER — TECHNETIUM TC 99M SESTAMIBI GENERIC - CARDIOLITE
33.0000 | Freq: Once | INTRAVENOUS | Status: AC | PRN
  Administered 2015-02-03: 33 via INTRAVENOUS

## 2015-02-03 MED ORDER — TECHNETIUM TC 99M SESTAMIBI GENERIC - CARDIOLITE
11.0000 | Freq: Once | INTRAVENOUS | Status: AC | PRN
  Administered 2015-02-03: 11 via INTRAVENOUS

## 2015-02-03 MED ORDER — REGADENOSON 0.4 MG/5ML IV SOLN
0.4000 mg | Freq: Once | INTRAVENOUS | Status: AC
  Administered 2015-02-03: 0.4 mg via INTRAVENOUS

## 2015-02-03 NOTE — Progress Notes (Signed)
[facility] [address] [place], [place] [address] [phone]    Cardiology Nuclear Med Study  [name] is a 69 y.o. male     MRN : [id number]     DOB: May 23, 1946  Procedure Date: 02/03/2015  Nuclear Med Background Indication for Stress Test:  Evaluation for Ischemia and Pending Surgical Clearance for Abdominal Aortagram and possible (R) lower extremity Surgery by Adele Barthel, MD History:  COPD and MPI: ok per pt No Report  Cardiac Risk Factors: Carotid Disease,Hypertension, and PAD  Symptoms:  DOE and Palpitations   Nuclear Pre-Procedure Caffeine/Decaff Intake:  None>12 hrs NPO After: 10:00pm   Lungs:  clear O2 Sat: 96% on room air. IV 0.9% NS with Angio Cath:  22g  IV Site: R Antecubital x 1, tolerated well IV Started by:  Irven Baltimore, RN  Chest Size (in):  44 Cup Size: n/a  Height: 6\' 2"  (1.88 m)  Weight:  215 lb (97.523 kg)  BMI:  Body mass index is 27.59 kg/(m^2). Tech Comments:  Patient took Norvasc, and held Coreg  this am. Irven Baltimore, RN.    Nuclear Med Study 1 or 2 day study: 1 day  Stress Test Type:  Carlton Adam  Reading MD: N/A  Order Authorizing Provider:  Murray Hodgkins, NP  Resting Radionuclide: Technetium 3m Sestamibi  Resting Radionuclide Dose: 11.0 mCi   Stress Radionuclide:  Technetium 74m Sestamibi  Stress Radionuclide Dose: 33.0 mCi           Stress Protocol Rest HR: 58 Stress HR: 78  Rest BP: 129/57 Stress BP: 129/64  Exercise Time (min): n/a METS: n/a   Predicted Max HR: 150 bpm % Max HR: 52 bpm Rate Pressure Product: 10062   Dose of Adenosine (mg):  n/a Dose of Lexiscan: 0.4 mg  Dose of Atropine (mg): n/a Dose of Dobutamine: n/a mcg/kg/min (at max HR)  Stress Test Technologist: Perrin Maltese, EMT-P  Nuclear Technologist:  Earl Many, CNMT     Rest Procedure:  Myocardial perfusion imaging was performed at rest 45 minutes following the intravenous administration of Technetium 67m  Sestamibi. Rest ECG: Sinus bradycardia with PVC's  Stress Procedure:  The patient received IV Lexiscan 0.4 mg over 15-seconds.  Technetium 7m Sestamibi injected at 30-seconds. This patient had sob with the Lexiscan injection. Quantitative spect images were obtained after a 45 minute delay. Stress ECG: There are scattered PVCs.  QPS Raw Data Images:  Normal; no motion artifact; normal heart/lung ratio. Stress Images:  There is decreased uptake in the apex. Rest Images:  There is decreased uptake in the apex. Subtraction (SDS):  No evidence of ischemia. Transient Ischemic Dilatation (Normal <1.22):  0.97 Lung/Heart Ratio (Normal <0.45):  0.28  Quantitative Gated Spect Images QGS EDV:  103 ml QGS ESV:  41 ml  Impression Exercise Capacity:  Lexiscan with no exercise. BP Response:  Normal blood pressure response. Clinical Symptoms:  There is dyspnea. ECG Impression:  There are scattered PVCs. Comparison with Prior Nuclear Study: No previous nuclear study performed  Overall Impression:  Low risk stress nuclear study with small-sized, mild intensity fixed apical perfusion defect (which is actually worse at rest than stress), likely attenuation artifact.  LV Ejection Fraction: 60%.  LV Wall Motion:  Normal Wall Motion   Pixie Casino, MD, [facility] Board Certified in Nuclear Cardiology Attending Cardiologist [place]

## 2015-02-04 ENCOUNTER — Telehealth: Payer: Self-pay | Admitting: Cardiovascular Disease

## 2015-02-04 ENCOUNTER — Encounter: Payer: Self-pay | Admitting: *Deleted

## 2015-02-04 MED ORDER — SODIUM CHLORIDE 0.9 % IV SOLN
INTRAVENOUS | Status: DC
  Administered 2015-02-05: 06:00:00 via INTRAVENOUS

## 2015-02-04 NOTE — Telephone Encounter (Signed)
Chart will be reviewed with DOD Dr Mare Ferrari in Dr Coopers/ Nurse absence. Per Dr Mare Ferrari, after reviewing last OV note and stress test results, pt is cleared and may take usual meds. Letter will be sent/ see chart.

## 2015-02-04 NOTE — Telephone Encounter (Signed)
New message    Request for surgical clearance:  1. What type of surgery is being performed? Aortogram   2. When is this surgery scheduled? 3.3.2016   3. Are there any medications that need to be held prior to surgery and how long no   4. Name of physician performing surgery? Dr. Bridgett Larsson   5. What is your office phone and fax number? [phone] /  fax [phone]

## 2015-02-05 ENCOUNTER — Encounter (HOSPITAL_COMMUNITY)
Admission: RE | Disposition: A | Discharge status: Home / Self Care | Payer: Self-pay | Source: Ambulatory Visit | Attending: Vascular Surgery

## 2015-02-05 ENCOUNTER — Telehealth: Payer: Self-pay | Admitting: Vascular Surgery

## 2015-02-05 ENCOUNTER — Encounter: Payer: Self-pay | Admitting: Cardiology

## 2015-02-05 ENCOUNTER — Ambulatory Visit (HOSPITAL_COMMUNITY)
Admission: RE | Admit: 2015-02-05 | Discharge: 2015-02-05 | Disposition: A | Discharge status: Home / Self Care | Payer: Medicare Other | Source: Ambulatory Visit | Attending: Vascular Surgery | Admitting: Vascular Surgery

## 2015-02-05 ENCOUNTER — Encounter (HOSPITAL_COMMUNITY): Payer: Self-pay | Admitting: Vascular Surgery

## 2015-02-05 DIAGNOSIS — Z95820 Peripheral vascular angioplasty status with implants and grafts: Secondary | ICD-10-CM | POA: Insufficient documentation

## 2015-02-05 DIAGNOSIS — N189 Chronic kidney disease, unspecified: Secondary | ICD-10-CM | POA: Insufficient documentation

## 2015-02-05 DIAGNOSIS — I70202 Unspecified atherosclerosis of native arteries of extremities, left leg: Secondary | ICD-10-CM | POA: Diagnosis not present

## 2015-02-05 DIAGNOSIS — Z8673 Personal history of transient ischemic attack (TIA), and cerebral infarction without residual deficits: Secondary | ICD-10-CM | POA: Insufficient documentation

## 2015-02-05 DIAGNOSIS — I6523 Occlusion and stenosis of bilateral carotid arteries: Secondary | ICD-10-CM | POA: Insufficient documentation

## 2015-02-05 DIAGNOSIS — Z7982 Long term (current) use of aspirin: Secondary | ICD-10-CM | POA: Insufficient documentation

## 2015-02-05 DIAGNOSIS — Z79899 Other long term (current) drug therapy: Secondary | ICD-10-CM | POA: Diagnosis not present

## 2015-02-05 DIAGNOSIS — F17210 Nicotine dependence, cigarettes, uncomplicated: Secondary | ICD-10-CM | POA: Insufficient documentation

## 2015-02-05 DIAGNOSIS — I70219 Atherosclerosis of native arteries of extremities with intermittent claudication, unspecified extremity: Secondary | ICD-10-CM | POA: Diagnosis present

## 2015-02-05 DIAGNOSIS — J449 Chronic obstructive pulmonary disease, unspecified: Secondary | ICD-10-CM | POA: Insufficient documentation

## 2015-02-05 DIAGNOSIS — I70211 Atherosclerosis of native arteries of extremities with intermittent claudication, right leg: Secondary | ICD-10-CM | POA: Diagnosis not present

## 2015-02-05 DIAGNOSIS — Z7952 Long term (current) use of systemic steroids: Secondary | ICD-10-CM | POA: Insufficient documentation

## 2015-02-05 DIAGNOSIS — E785 Hyperlipidemia, unspecified: Secondary | ICD-10-CM | POA: Insufficient documentation

## 2015-02-05 DIAGNOSIS — E780 Pure hypercholesterolemia: Secondary | ICD-10-CM | POA: Insufficient documentation

## 2015-02-05 DIAGNOSIS — I739 Peripheral vascular disease, unspecified: Secondary | ICD-10-CM | POA: Diagnosis present

## 2015-02-05 DIAGNOSIS — I129 Hypertensive chronic kidney disease with stage 1 through stage 4 chronic kidney disease, or unspecified chronic kidney disease: Secondary | ICD-10-CM | POA: Insufficient documentation

## 2015-02-05 DIAGNOSIS — I998 Other disorder of circulatory system: Secondary | ICD-10-CM | POA: Diagnosis not present

## 2015-02-05 HISTORY — PX: ABDOMINAL AORTAGRAM: SHX5454

## 2015-02-05 LAB — POCT I-STAT, CHEM 8
BUN: 21 mg/dL (ref 6–23)
CREATININE: 1.4 mg/dL — AB (ref 0.50–1.35)
Calcium, Ion: 1.19 mmol/L (ref 1.13–1.30)
Chloride: 102 mmol/L (ref 96–112)
Glucose, Bld: 104 mg/dL — ABNORMAL HIGH (ref 70–99)
HEMATOCRIT: 50 % (ref 39.0–52.0)
Hemoglobin: 17 g/dL (ref 13.0–17.0)
POTASSIUM: 3.9 mmol/L (ref 3.5–5.1)
Sodium: 139 mmol/L (ref 135–145)
TCO2: 23 mmol/L (ref 0–100)

## 2015-02-05 SURGERY — ABDOMINAL AORTAGRAM
Anesthesia: LOCAL

## 2015-02-05 MED ORDER — OXYCODONE-ACETAMINOPHEN 5-325 MG PO TABS: 1.0000 | ORAL_TABLET | ORAL | Status: DC | PRN

## 2015-02-05 MED ORDER — MIDAZOLAM HCL 2 MG/2ML IJ SOLN
INTRAMUSCULAR | Status: AC
Start: 2015-02-05 — End: 2015-02-05
  Filled 2015-02-05: qty 2

## 2015-02-05 MED ORDER — HEPARIN (PORCINE) IN NACL 2-0.9 UNIT/ML-% IJ SOLN
INTRAMUSCULAR | Status: AC
  Filled 2015-02-05: qty 1500

## 2015-02-05 MED ORDER — SODIUM CHLORIDE 0.9 % IV SOLN: 1.0000 mL/kg/h | INTRAVENOUS | Status: DC

## 2015-02-05 MED ORDER — FENTANYL CITRATE 0.05 MG/ML IJ SOLN
INTRAMUSCULAR | Status: AC
  Filled 2015-02-05: qty 2

## 2015-02-05 MED ORDER — LIDOCAINE HCL (PF) 1 % IJ SOLN
INTRAMUSCULAR | Status: AC
Start: 2015-02-05 — End: 2015-02-05
  Filled 2015-02-05: qty 30

## 2015-02-05 NOTE — Telephone Encounter (Signed)
Spoke with the patient today regarding his right lower extremity arteriogram. There is no obvious area of narrowing that would be treatable currently. Certainly his right first toe could represent an area of distal embolization. Conservative management for now with protection of the toe hopefully this will heal over the next few weeks. I spoke with the patient and told him to return at the toe has not healed within 1 month. We'll also need a follow-up visit in 6 months time for her carotid duplex scan.  Ruta Hinds, MD [facility] Office: [phone] Pager: [phone]

## 2015-02-05 NOTE — H&P (View-Only) (Signed)
MRN : [id number]  Reason for Consult: Right great toe discoloration, Carotid stenosis s/p left CEA/stent 2007/2011 Referring Physician: Dr. Raelene Bott  History of Present Illness: 69 y/o male who has had a left CEA in 2007 followed by left carotid stent in 2011 by Dr. Oneida Alar is here for a carotid follow up visit.  He reports no amaurosis, no weakness in either extremities, and no slurred speech. of stroke or TIA.     He also has a history of PAD and has had Right SFA and external iliac stents, left SFA stent which is chronically occluded, left external iliac and left common iliac stents all placed by Dr. Albertine Patricia years ago.  He reports having symptoms of claudication.  He can walk so far and he gets numbness and pain in the right foot.  He will stop and rest for 90 seconds and the numbness goes away and he can walk more.  He had a bout of gout 3 weeks ago that lead to right great toe pain and now the tip or the toes is dark and cool to touch.  No ulcers or drainage.    His past medical history includes TIA, COPD, hypertension, hypercholesterolemia and chronic kidney disease followed by Dr. Justin Mend.  His Cardiologist is Dr. Burt Knack.  He has an allergy to Plavix and takes a full strength Asprin daily, a Statin, and a beta blocker.     Current Outpatient Prescriptions  Medication Sig Dispense Refill  . amLODipine (NORVASC) 10 MG tablet TAKE 1 TABLET BY MOUTH EVERY DAY. 15 tablet 0  . amoxicillin-clavulanate (AUGMENTIN) 875-125 MG per tablet Take 1 tablet by mouth daily.     [name] aspirin 325 MG tablet Take 325 mg by mouth every evening.     . carvedilol (COREG) 12.5 MG tablet TAKE 1 TABLET BY MOUTH TWICE DAILY WITH MEALS. 30 tablet 0  . mycophenolate (CELLCEPT) 250 MG capsule Take 250 mg by mouth 4 (four) times daily.     [name] omeprazole (PRILOSEC) 20 MG capsule TAKE ONE CAPSULE BY MOUTH EVERY DAY AT 6AM    . oxyCODONE-acetaminophen (PERCOCET) 7.5-325 MG per tablet Take 1 tablet by mouth every 6 (six)  hours as needed.     . rosuvastatin (CRESTOR) 10 MG tablet Take 10 mg by mouth every evening.     . triamterene-hydrochlorothiazide (MAXZIDE) 75-50 MG per tablet Take 1 tablet by mouth daily.    [name] loratadine (CLARITIN) 10 MG tablet Take 10 mg by mouth daily.    . predniSONE (DELTASONE) 20 MG tablet Take 20 mg by mouth daily.     No current facility-administered medications for this visit.    Pt meds include: Statin :Yes Betablocker: Yes ASA: Yes Other anticoagulants/antiplatelets: none  Past Medical History  Diagnosis Date  . History of carotid endarterectomy   . H/O angioplasty   . HTN (hypertension)   . Exertional dyspnea   . Edema   . Hyperlipidemia   . PAD (peripheral artery disease)   . Stroke   . Chronic kidney disease     Past Surgical History  Procedure Laterality Date  . Lumbar disc surgery      x 2  . Carotid endarterecotomy    . Stnt      lower extermity  . External iliac      status post bilateral and left common iliac artery  . Lumbar back surgery      x2  . Tonsillectomy    . Tendon repair  to the left arm    Social History History  Substance Use Topics  . Smoking status: Current Every Day Smoker -- 1.00 packs/day for 50 years    Types: Cigarettes  . Smokeless tobacco: Not on file  . Alcohol Use: 4.2 oz/week    7 Glasses of wine per week    Family History Family History  Problem Relation Age of Onset  . Hypertension Mother   . Hyperlipidemia Mother     Allergies  Allergen Reactions  . Clopidogrel Rash    (plavix)     REVIEW OF SYSTEMS  General: [ ]  Weight loss, [ ]  Fever, [ ]  chills Neurologic: [ ]  Dizziness, [ ]  Blackouts, [ ]  Seizure [ ]  Stroke, [ ]  "Mini stroke", [ ]  Slurred speech, [ ]  Temporary blindness; [ ]  weakness in arms or legs, [ ]  Hoarseness [ ]  Dysphagia Cardiac: [ ]  Chest pain/pressure, [ ]  Shortness of breath at rest [x ] Shortness of breath with exertion, [ ]  Atrial fibrillation or irregular heartbeat    Vascular: [x ] Pain in legs with walking, [ ]  Pain in legs at rest, [ ]  Pain in legs at night,  [ ]  Non-healing ulcer, [ ]  Blood clot in vein/DVT,   Pulmonary: [ ]  Home oxygen, [ ]  Productive cough, [ ]  Coughing up blood, [ ]  Asthma,  [x ] Wheezing [x ] COPD Musculoskeletal:  [ ]  Arthritis, [ ]  Low back pain, [ ]  Joint pain Hematologic: [ ]  Easy Bruising, [ ]  Anemia; [ ]  Hepatitis Gastrointestinal: [ ]  Blood in stool, [ ]  Gastroesophageal Reflux/heartburn, Urinary: [x ] chronic Kidney disease, [ ]  on HD - [ ]  MWF or [ ]  TTHS, [ ]  Burning with urination, [ ]  Difficulty urinating Skin: [ ]  Rashes, [x ] Wounds Psychological: [ ]  Anxiety, [ ]  Depression  Physical Examination Filed Vitals:   01/29/15 1354 01/29/15 1355  BP: 138/63 134/67  Pulse: 51   Height: 6\' 2"  (1.88 m)   Weight: 216 lb (97.977 kg)   SpO2: 99%    Body mass index is 27.72 kg/(m^2).  General:  WDWN in NAD Gait: Normal HENT: WNL Eyes: Pupils equal Pulmonary: normal non-labored breathing , without Rales, rhonchi, expiratory  wheezing Cardiac: RRR, without  Murmurs, rubs or gallops; No carotid bruits Abdomen: soft, NT, no masses Skin: no rashes, ulcers noted;  no Gangrene , no cellulitis; no open wounds;  Right great toe tip dusky and cool to touch  Vascular Exam/Pulses:Radial pulses palpable,2+ left femoral, absent right femoral popliteals and DP/PT   Musculoskeletal: no muscle wasting or atrophy; no edema  Neurologic: A&O X 3; Appropriate Affect ;  SENSATION: normal; MOTOR FUNCTION: 5/5 Symmetric Speech is fluent/normal     Non-Invasive Vascular Imaging:  ABI right 0.78 monophasic flow Left 0.53 monophasic flow   Carotid duplex Right < 40% Left 40-59% Left common carotid stent is patent   ASSESSMENT/PLAN:  Carotid stenosis s/p CEA and common carotid stent left stable PAD bilateral with ischemic right great toe changes We will send him to Dr. Burt Knack for cardiac clearance and set him up for an  angiogram with bilateral run off by Dr. Trula Slade on March 2nd.  We will need lab work to check his Kidney function and determine if we can do the angiogram with contrast.       Laurence Slate [facility] 01/29/2015 2:36 PM  The patient was seen in clinic today in conjunction with Dr. Oneida Alar.   History and exam findings as above.  The patient has shortness distance claudication in the right leg. He has previous he had bilateral SFA stents and multiple iliac stents by Dr. Albertine Patricia in the past. He now has an ischemic right first toe.  He likely will require a right leg bypass in order to improve perfusion to heal the toe. He will be scheduled for cardiac risk stratification and a lower extremity arteriogram. Hopefully his kidney function will be reasonable for that. His creatinine has been as high as 2 in the past. We may have to use CO2 contrast for his study combined with limited contrast.  Ruta Hinds, MD [facility] [place]: [phone] Pager: [phone]

## 2015-02-05 NOTE — Discharge Instructions (Signed)
Angiogram, Care After °Refer to this sheet in the next few weeks. These instructions provide you with information on caring for yourself after your procedure. Your health care provider may also give you more specific instructions. Your treatment has been planned according to current medical practices, but problems sometimes occur. Call your health care provider if you have any problems or questions after your procedure.  °WHAT TO EXPECT AFTER THE PROCEDURE °After your procedure, it is typical to have the following sensations: °· Minor discomfort or tenderness and a small bump at the catheter insertion site. The bump should usually decrease in size and tenderness within 1 to 2 weeks. °· Any bruising will usually fade within 2 to 4 weeks. °HOME CARE INSTRUCTIONS  °· You may need to keep taking blood thinners if they were prescribed for you. Take medicines only as directed by your health care provider. °· Do not apply powder or lotion to the site. °· Do not take baths, swim, or use a hot tub until your health care provider approves. °· You may shower 24 hours after the procedure. Remove the bandage (dressing) and gently wash the site with plain soap and water. Gently pat the site dry. °· Inspect the site at least twice daily. °· Limit your activity for the first 48 hours. Do not bend, squat, or lift anything over 20 lb (9 kg) or as directed by your health care provider. °· Plan to have someone take you home after the procedure. Follow instructions about when you can drive or return to work. °SEEK MEDICAL CARE IF: °· You get light-headed when standing up. °· You have drainage (other than a small amount of blood on the dressing). °· You have chills. °· You have a fever. °· You have redness, warmth, swelling, or pain at the insertion site. °SEEK IMMEDIATE MEDICAL CARE IF:  °· You develop chest pain or shortness of breath, feel faint, or pass out. °· You have bleeding, swelling larger than a walnut, or drainage from the  catheter insertion site. °· You develop pain, discoloration, coldness, or severe bruising in the leg or arm that held the catheter. °· You have heavy bleeding from the site. If this happens, hold pressure on the site and call 911. °MAKE SURE YOU: °· Understand these instructions. °· Will watch your condition. °· Will get help right away if you are not doing well or get worse. °Document Released: 06/09/2005 Document Revised: 04/07/2014 Document Reviewed: 04/15/2013 °ExitCare® Patient Information ©2015 [employer]. This information is not intended to replace advice given to you by your health care provider. Make sure you discuss any questions you have with your health care provider. ° °

## 2015-02-05 NOTE — Progress Notes (Signed)
Site area: Left groin a 5 french arterial sheath was removed  Site Prior to Removal:  Level 0  Pressure Applied For 30 MINUTES    Minutes Beginning at 0835a  Manual:   Yes.    Patient Status During Pull:  stable  Post Pull Groin Site:  Level 0  Post Pull Instructions Given:  Yes.    Post Pull Pulses Present:  Yes.    Dressing Applied:  Yes.    Comments:  VS remain stable during sheath pull.  Pt denies any discomfort at this time.

## 2015-02-05 NOTE — Op Note (Signed)
OPERATIVE NOTE   PROCEDURE: 1.  Left common femoral artery cannulation under ultrasound guidance 2.  Placement of catheter in aorta 3.  Aortogram 4.  Bilateral leg runoff via catheter  PRE-OPERATIVE DIAGNOSIS: right great toe ischemia   POST-OPERATIVE DIAGNOSIS: same as above   SURGEON: Adele Barthel, MD  ANESTHESIA: conscious sedation  ESTIMATED BLOOD LOSS: 50 cc  CONTRAST: 125 cc  FINDING(S):  Aorta: patent  Superior mesenteric artery: patent Celiac artery: patent   Right Left  RA Patent Patent  CIA Patent with patent stents Patent with patent stent  EIA Patent with patent stents, distal stenosis <50%  Patent with patent stent  IIA Occluded Occluded  CFA Patent Patent  SFA Patent with patents with in-stent stenosis <50% Patent proximally with mid-segment stenosis >90%, occluded distal SFA stent  PFA Patent Patent  Pop Patent Reconstitutes proximal via SFA and profunda collaterals  Trif Patent Patent  AT Patent proximally and then occluded Patent, co-dominant runoff   Pero Patent but miniscule Patent but miniscule  PT Patent, dominant runoff Patent, co-dominant runoff    SPECIMEN(S):  none  INDICATIONS:   [name] is a 69 y.o. male who presents with ischemic right great toe.  The patient presents for: aortogram, bilateral leg runoff.  I discussed with the patient the nature of angiographic procedures, especially the limited patencies of any endovascular intervention.  The patient is aware of that the risks of an angiographic procedure include but are not limited to: bleeding, infection, access site complications, renal failure, embolization, rupture of vessel, dissection, possible need for emergent surgical intervention, possible need for surgical procedures to treat the patient's pathology, and stroke and death.  The patient is aware of the risks and agrees to proceed.  DESCRIPTION: After full informed consent was obtained from the patient, the patient was  brought back to the angiography suite.  The patient was placed supine upon the angiography table and connected to monitoring equipment.  The patient was then given conscious sedation, the amounts of which are documented in the patient's chart.  The patient was prepped and drape in the standard fashion for an angiographic procedure.  At this point, attention was turned to the left groin. Under ultrasound guidance, the subcutaneous tissue surrounding the left common femoral artery was anesthesized with 1% lidocaine with epinephrine.  The artery was then cannulated with a micropuncture needle.  The microwire was advanced into the iliac arterial system.  The needle was exchanged for a microsheath, which was loaded into the common femoral artery over the wire.  The microwire was exchanged for a [facility] wire which was advanced into the left common iliac artery.  The microsheath was then exchanged for a 5-Fr sheath which was loaded into the common femoral artery.  The wire would not advance past the left common iliac artery, so I put a KMP catheter over the wire and did a hand injection in the left common iliac artery after removing the wire.  This demonstrated a patent left common iliac artery.  I navigated through the left common iliac artery stent into the aorta.  I tried to load the Omniflush catheter over the wire, but the catheter hung up on the common iliac artery stent.  I then loaded a pigtail catheter over the wire up to the level of L1.  The catheter was connected to the power injector circuit.  After de-airring and de-clotting the circuit, a power injector aortogram was completed.  The findings are listed above.  I pulled the catheter down to the distal aorta.  An automated bilateral leg runoff was completed.  The findings are listed above.  Based on the images, I don't see a correctable lesion in the right arterial system to account for his right great toe ischemia.  COMPLICATIONS: none  CONDITION:  stable   Adele Barthel, MD [facility] [place] Office: [phone] Pager: [phone]  02/05/2015, 8:36 AM

## 2015-02-05 NOTE — Interval H&P Note (Signed)
[facility] [place]  History and Physical Update  The patient was interviewed and re-examined.  The patient's previous History and Physical has been reviewed and is unchanged from Dr. Oneida Alar consult.  There is no change in the plan of care: Aortogram, right leg runoff.  I discussed with the patient the nature of angiographic procedures, especially the limited patencies of any endovascular intervention.  The patient is aware of that the risks of an angiographic procedure include but are not limited to: bleeding, infection, access site complications, renal failure, embolization, rupture of vessel, dissection, possible need for emergent surgical intervention, possible need for surgical procedures to treat the patient's pathology, anaphylactic reaction to contrast, and stroke and death.  The patient is aware of the risks and agrees to proceed.   Adele Barthel, MD [facility] [place] Office: [phone] Pager: [phone]  02/05/2015, 7:17 AM

## 2015-02-06 ENCOUNTER — Telehealth: Payer: Self-pay | Admitting: Vascular Surgery

## 2015-02-06 ENCOUNTER — Other Ambulatory Visit: Payer: Self-pay | Admitting: *Deleted

## 2015-02-06 DIAGNOSIS — I6523 Occlusion and stenosis of bilateral carotid arteries: Secondary | ICD-10-CM

## 2015-02-06 DIAGNOSIS — I739 Peripheral vascular disease, unspecified: Secondary | ICD-10-CM

## 2015-02-06 NOTE — Telephone Encounter (Signed)
-----   Message from Sherrye Payor, RN sent at 02/05/2015 11:48 AM EST ----- Regarding: Zigmund Daniel log; also needs 2-3 wk f/u with CEF   ----- Message -----    From: Conrad [place], MD    Sent: 02/05/2015   8:46 AM      To: [address]  [name] [id number] Aug 04, 1946  PROCEDURE: 1. Left common femoral artery cannulation under ultrasound guidance 2. Placement of catheter in aorta 3. Aortogram 4. Bilateral leg runoff via catheter  Follow-up: Dr. Oneida Alar 2-3 weeks

## 2015-02-06 NOTE — Telephone Encounter (Signed)
LM for pt re appt, dpm °

## 2015-02-06 NOTE — Telephone Encounter (Addendum)
-----   Message from Mena Goes, RN sent at 02/06/2015 10:31 AM EST ----- Regarding: Schedule   ----- Message -----    From: Elam Dutch, MD    Sent: 02/05/2015   5:25 PM      To: [facility] Charge Pool  Pt needs carotid duplex in 6 months with lower extremity arterial duplex right leg and bilat ABI.   Ruta Hinds   02/06/15: lm for pt re appt, dpm

## 2015-02-10 DIAGNOSIS — N189 Chronic kidney disease, unspecified: Secondary | ICD-10-CM | POA: Diagnosis not present

## 2015-02-10 DIAGNOSIS — N055 Unspecified nephritic syndrome with diffuse mesangiocapillary glomerulonephritis: Secondary | ICD-10-CM | POA: Diagnosis not present

## 2015-02-10 DIAGNOSIS — E785 Hyperlipidemia, unspecified: Secondary | ICD-10-CM | POA: Diagnosis not present

## 2015-02-10 DIAGNOSIS — D631 Anemia in chronic kidney disease: Secondary | ICD-10-CM | POA: Diagnosis not present

## 2015-02-12 ENCOUNTER — Other Ambulatory Visit (HOSPITAL_COMMUNITY): Payer: Medicare Other

## 2015-02-12 ENCOUNTER — Encounter (HOSPITAL_COMMUNITY): Payer: Medicare Other

## 2015-02-17 ENCOUNTER — Encounter: Payer: Self-pay | Admitting: Vascular Surgery

## 2015-02-18 ENCOUNTER — Ambulatory Visit (INDEPENDENT_AMBULATORY_CARE_PROVIDER_SITE_OTHER): Payer: Medicare Other | Admitting: Vascular Surgery

## 2015-02-18 VITALS — BP 149/100 | HR 61 | Resp 16 | Ht 74.0 in | Wt 216.0 lb

## 2015-02-18 DIAGNOSIS — Z48812 Encounter for surgical aftercare following surgery on the circulatory system: Secondary | ICD-10-CM | POA: Diagnosis not present

## 2015-02-18 DIAGNOSIS — I739 Peripheral vascular disease, unspecified: Secondary | ICD-10-CM

## 2015-02-18 DIAGNOSIS — I7092 Chronic total occlusion of artery of the extremities: Secondary | ICD-10-CM | POA: Diagnosis not present

## 2015-02-18 DIAGNOSIS — I6529 Occlusion and stenosis of unspecified carotid artery: Secondary | ICD-10-CM

## 2015-02-18 NOTE — Progress Notes (Signed)
MRN : [id number]  Reason for Consult: Right great toe discoloration, Carotid stenosis s/p left CEA/stent 2007/2011 Referring Physician: Dr. Raelene Bott  History of Present Illness: 69 y/o male with a history of PAD and has had Right SFA and external iliac stents, left SFA stent which is chronically occluded, left external iliac and left common iliac stents all placed by Dr. Albertine Patricia years ago.  He reports having symptoms of claudication.  He can walk so far and he gets numbness and pain in the right foot.  He will stop and rest for 90 seconds and the numbness goes away and he can walk more.  He had a bout of gout 3 weeks ago that lead to right great toe pain and now the tip or the toes is dark and cool to touch.  No ulcers or drainage.     He had a left CEA in 2007 followed by left carotid stent in 2011 by Dr. Oneida Alar   His past medical history includes TIA, COPD, hypertension, hypercholesterolemia and chronic kidney disease followed by Dr. Justin Mend.  His Cardiologist is Dr. Burt Knack.  He has an allergy to Plavix and takes a full strength Asprin daily, a Statin, and a beta blocker.       Current Outpatient Prescriptions   Medication  Sig  Dispense  Refill   .  amLODipine (NORVASC) 10 MG tablet  TAKE 1 TABLET BY MOUTH EVERY DAY.  15 tablet  0   .  amoxicillin-clavulanate (AUGMENTIN) 875-125 MG per tablet  Take 1 tablet by mouth daily.        [name]  aspirin 325 MG tablet  Take 325 mg by mouth every evening.        .  carvedilol (COREG) 12.5 MG tablet  TAKE 1 TABLET BY MOUTH TWICE DAILY WITH MEALS.  30 tablet  0   .  mycophenolate (CELLCEPT) 250 MG capsule  Take 250 mg by mouth 4 (four) times daily.        [name]  omeprazole (PRILOSEC) 20 MG capsule  TAKE ONE CAPSULE BY MOUTH EVERY DAY AT 6AM       .  oxyCODONE-acetaminophen (PERCOCET) 7.5-325 MG per tablet  Take 1 tablet by mouth every 6 (six) hours as needed.        .  rosuvastatin (CRESTOR) 10 MG tablet  Take 10 mg by mouth every evening.        .   triamterene-hydrochlorothiazide (MAXZIDE) 75-50 MG per tablet  Take 1 tablet by mouth daily.       [name]  loratadine (CLARITIN) 10 MG tablet  Take 10 mg by mouth daily.       .  predniSONE (DELTASONE) 20 MG tablet  Take 20 mg by mouth daily.          No current facility-administered medications for this visit.     Pt meds include: Statin :Yes Betablocker: Yes ASA: Yes Other anticoagulants/antiplatelets: none    Past Medical History   Diagnosis  Date   .  History of carotid endarterectomy     .  H/O angioplasty     .  HTN (hypertension)     .  Exertional dyspnea     .  Edema     .  Hyperlipidemia     .  PAD (peripheral artery disease)     .  Stroke     .  Chronic kidney disease         Past Surgical History  Procedure  Laterality  Date   .  Lumbar disc surgery           x 2   .  Carotid endarterecotomy       .  Stnt           lower extermity   .  External iliac           status post bilateral and left common iliac artery   .  Lumbar back surgery           x2   .  Tonsillectomy       .  Tendon repair           to the left arm     Social History History   Substance Use Topics   .  Smoking status:  Current Every Day Smoker -- 1.00 packs/day for 50 years       Types:  Cigarettes   .  Smokeless tobacco:  Not on file   .  Alcohol Use:  4.2 oz/week       7 Glasses of wine per week     Family History Family History   Problem  Relation  Age of Onset   .  Hypertension  Mother     .  Hyperlipidemia  Mother         Allergies   Allergen  Reactions   .  Clopidogrel  Rash       (plavix)      REVIEW OF SYSTEMS  He denies shortness of breath. He denies chest pain.  Physical Examination  Vitals: Filed Vitals:   02/18/15 1046  BP: 149/100  Pulse: 61  Resp: 16  Height: 6\' 2"  (1.88 m)  Weight: 216 lb (97.977 kg)    General:  WDWN in NAD Skin: no rashes, ulcers noted;  no Gangrene , no cellulitis; no open wounds;  Right great toe tip essentially the same as  all other digits no ulceration no evidence of embolization  Vascular Exam/Pulses: No palpable pedal pulses right foot  Non-Invasive Vascular Imaging:   Prior office visit several weeks ago showed   ABI right 0.78 monophasic flow Left 0.53 monophasic flow   Carotid duplex Right < 40% Left 40-59% Left common carotid stent is patent  Recent arteriogram showed primarily right PT runoff.  All large vessels patent to right foot   ASSESSMENT:   Carotid stenosis s/p CEA and common carotid stent left stable PAD bilateral with ischemic right great toe changes now completely healed  Plan:  The patient will follow-up with our nurse practitioner in one year for repeat carotid duplex exam. He will also need bilateral ABIs in one year. He also needs arterial duplex of both lower extremities. If he has further embolic events consideration would need to be considered for adding an additional antiplatelet agent. The patient has had Plavix in the past but had an allergic reaction with a rash to this. We might need to consider Brilinta if he has another atheroembolic event.  Ruta Hinds, MD [facility] [place] Office: [phone] Pager: [phone]

## 2015-02-19 NOTE — Addendum Note (Signed)
Addended by: Thresa Ross C on: 02/19/2015 04:06 PM   Modules accepted: Orders

## 2015-03-12 ENCOUNTER — Ambulatory Visit: Payer: Medicare Other | Admitting: Vascular Surgery

## 2015-03-17 DIAGNOSIS — G8929 Other chronic pain: Secondary | ICD-10-CM | POA: Diagnosis not present

## 2015-03-17 DIAGNOSIS — I739 Peripheral vascular disease, unspecified: Secondary | ICD-10-CM | POA: Diagnosis not present

## 2015-03-17 DIAGNOSIS — N181 Chronic kidney disease, stage 1: Secondary | ICD-10-CM | POA: Diagnosis not present

## 2015-03-17 DIAGNOSIS — N184 Chronic kidney disease, stage 4 (severe): Secondary | ICD-10-CM | POA: Diagnosis not present

## 2015-03-17 DIAGNOSIS — J439 Emphysema, unspecified: Secondary | ICD-10-CM | POA: Diagnosis not present

## 2015-04-16 DIAGNOSIS — L03031 Cellulitis of right toe: Secondary | ICD-10-CM | POA: Diagnosis not present

## 2015-04-16 DIAGNOSIS — I739 Peripheral vascular disease, unspecified: Secondary | ICD-10-CM | POA: Diagnosis not present

## 2015-04-16 DIAGNOSIS — N181 Chronic kidney disease, stage 1: Secondary | ICD-10-CM | POA: Diagnosis not present

## 2015-04-16 DIAGNOSIS — L03011 Cellulitis of right finger: Secondary | ICD-10-CM | POA: Diagnosis not present

## 2015-04-20 DIAGNOSIS — G8929 Other chronic pain: Secondary | ICD-10-CM | POA: Diagnosis not present

## 2015-04-20 DIAGNOSIS — L03011 Cellulitis of right finger: Secondary | ICD-10-CM | POA: Diagnosis not present

## 2015-04-20 DIAGNOSIS — L03031 Cellulitis of right toe: Secondary | ICD-10-CM | POA: Diagnosis not present

## 2015-05-12 DIAGNOSIS — N181 Chronic kidney disease, stage 1: Secondary | ICD-10-CM | POA: Diagnosis not present

## 2015-05-12 DIAGNOSIS — G8929 Other chronic pain: Secondary | ICD-10-CM | POA: Diagnosis not present

## 2015-05-12 DIAGNOSIS — L03031 Cellulitis of right toe: Secondary | ICD-10-CM | POA: Diagnosis not present

## 2015-05-12 DIAGNOSIS — I739 Peripheral vascular disease, unspecified: Secondary | ICD-10-CM | POA: Diagnosis not present

## 2015-08-14 ENCOUNTER — Encounter (HOSPITAL_COMMUNITY): Payer: Medicare Other

## 2015-08-14 ENCOUNTER — Other Ambulatory Visit (HOSPITAL_COMMUNITY): Payer: Medicare Other

## 2015-08-14 ENCOUNTER — Ambulatory Visit: Payer: Medicare Other | Admitting: Family

## 2015-08-27 DIAGNOSIS — N189 Chronic kidney disease, unspecified: Secondary | ICD-10-CM | POA: Diagnosis not present

## 2015-08-27 DIAGNOSIS — E785 Hyperlipidemia, unspecified: Secondary | ICD-10-CM | POA: Diagnosis not present

## 2015-08-27 DIAGNOSIS — N055 Unspecified nephritic syndrome with diffuse mesangiocapillary glomerulonephritis: Secondary | ICD-10-CM | POA: Diagnosis not present

## 2015-08-27 DIAGNOSIS — D631 Anemia in chronic kidney disease: Secondary | ICD-10-CM | POA: Diagnosis not present

## 2015-09-22 DIAGNOSIS — I739 Peripheral vascular disease, unspecified: Secondary | ICD-10-CM | POA: Diagnosis not present

## 2015-09-22 DIAGNOSIS — Z23 Encounter for immunization: Secondary | ICD-10-CM | POA: Diagnosis not present

## 2015-09-22 DIAGNOSIS — Z79899 Other long term (current) drug therapy: Secondary | ICD-10-CM | POA: Insufficient documentation

## 2015-09-22 DIAGNOSIS — Z0000 Encounter for general adult medical examination without abnormal findings: Secondary | ICD-10-CM | POA: Diagnosis not present

## 2015-09-22 DIAGNOSIS — I10 Essential (primary) hypertension: Secondary | ICD-10-CM | POA: Diagnosis not present

## 2015-09-22 DIAGNOSIS — J439 Emphysema, unspecified: Secondary | ICD-10-CM | POA: Diagnosis not present

## 2015-09-22 DIAGNOSIS — N183 Chronic kidney disease, stage 3 (moderate): Secondary | ICD-10-CM | POA: Diagnosis not present

## 2015-11-05 DIAGNOSIS — J209 Acute bronchitis, unspecified: Secondary | ICD-10-CM | POA: Diagnosis not present

## 2015-12-21 DIAGNOSIS — J441 Chronic obstructive pulmonary disease with (acute) exacerbation: Secondary | ICD-10-CM | POA: Diagnosis not present

## 2015-12-21 DIAGNOSIS — J019 Acute sinusitis, unspecified: Secondary | ICD-10-CM | POA: Diagnosis not present

## 2015-12-21 DIAGNOSIS — G8929 Other chronic pain: Secondary | ICD-10-CM | POA: Diagnosis not present

## 2016-02-15 ENCOUNTER — Encounter: Payer: Self-pay | Admitting: Family

## 2016-02-18 ENCOUNTER — Ambulatory Visit (INDEPENDENT_AMBULATORY_CARE_PROVIDER_SITE_OTHER)
Admission: RE | Admit: 2016-02-18 | Discharge: 2016-02-18 | Disposition: A | Discharge status: Home / Self Care | Payer: Medicare Other | Source: Ambulatory Visit | Attending: Vascular Surgery | Admitting: Vascular Surgery

## 2016-02-18 ENCOUNTER — Encounter: Payer: Self-pay | Admitting: Family

## 2016-02-18 ENCOUNTER — Other Ambulatory Visit: Payer: Self-pay | Admitting: Vascular Surgery

## 2016-02-18 ENCOUNTER — Ambulatory Visit (HOSPITAL_COMMUNITY)
Admission: RE | Admit: 2016-02-18 | Discharge: 2016-02-18 | Disposition: A | Discharge status: Home / Self Care | Payer: Medicare Other | Source: Ambulatory Visit | Attending: Family | Admitting: Family

## 2016-02-18 ENCOUNTER — Ambulatory Visit (INDEPENDENT_AMBULATORY_CARE_PROVIDER_SITE_OTHER): Payer: Medicare Other | Admitting: Family

## 2016-02-18 VITALS — BP 134/72 | HR 60 | Temp 97.3°F | Resp 14 | Ht 74.0 in | Wt 214.0 lb

## 2016-02-18 DIAGNOSIS — Z48812 Encounter for surgical aftercare following surgery on the circulatory system: Secondary | ICD-10-CM

## 2016-02-18 DIAGNOSIS — I739 Peripheral vascular disease, unspecified: Secondary | ICD-10-CM

## 2016-02-18 DIAGNOSIS — Z720 Tobacco use: Secondary | ICD-10-CM

## 2016-02-18 DIAGNOSIS — Z95828 Presence of other vascular implants and grafts: Secondary | ICD-10-CM

## 2016-02-18 DIAGNOSIS — Z8673 Personal history of transient ischemic attack (TIA), and cerebral infarction without residual deficits: Secondary | ICD-10-CM | POA: Diagnosis not present

## 2016-02-18 DIAGNOSIS — I6529 Occlusion and stenosis of unspecified carotid artery: Secondary | ICD-10-CM

## 2016-02-18 DIAGNOSIS — E785 Hyperlipidemia, unspecified: Secondary | ICD-10-CM | POA: Diagnosis not present

## 2016-02-18 DIAGNOSIS — F17200 Nicotine dependence, unspecified, uncomplicated: Secondary | ICD-10-CM

## 2016-02-18 DIAGNOSIS — I6522 Occlusion and stenosis of left carotid artery: Secondary | ICD-10-CM | POA: Insufficient documentation

## 2016-02-18 DIAGNOSIS — I6523 Occlusion and stenosis of bilateral carotid arteries: Secondary | ICD-10-CM

## 2016-02-18 DIAGNOSIS — I129 Hypertensive chronic kidney disease with stage 1 through stage 4 chronic kidney disease, or unspecified chronic kidney disease: Secondary | ICD-10-CM | POA: Insufficient documentation

## 2016-02-18 DIAGNOSIS — N183 Chronic kidney disease, stage 3 (moderate): Secondary | ICD-10-CM | POA: Insufficient documentation

## 2016-02-18 DIAGNOSIS — Z9889 Other specified postprocedural states: Secondary | ICD-10-CM

## 2016-02-18 NOTE — Patient Instructions (Signed)
Stroke Prevention Some medical conditions and behaviors are associated with an increased chance of having a stroke. You may prevent a stroke by making healthy choices and managing medical conditions. HOW CAN I REDUCE MY RISK OF HAVING A STROKE?   Stay physically active. Get at least 30 minutes of activity on most or all days.  Do not smoke. It may also be helpful to avoid exposure to secondhand smoke.  Limit alcohol use. Moderate alcohol use is considered to be:  No more than 2 drinks per day for men.  No more than 1 drink per day for nonpregnant women.  Eat healthy foods. This involves:  Eating 5 or more servings of fruits and vegetables a day.  Making dietary changes that address high blood pressure (hypertension), high cholesterol, diabetes, or obesity.  Manage your cholesterol levels.  Making food choices that are high in fiber and low in saturated fat, trans fat, and cholesterol may control cholesterol levels.  Take any prescribed medicines to control cholesterol as directed by your health care provider.  Manage your diabetes.  Controlling your carbohydrate and sugar intake is recommended to manage diabetes.  Take any prescribed medicines to control diabetes as directed by your health care provider.  Control your hypertension.  Making food choices that are low in salt (sodium), saturated fat, trans fat, and cholesterol is recommended to manage hypertension.  Ask your health care provider if you need treatment to lower your blood pressure. Take any prescribed medicines to control hypertension as directed by your health care provider.  If you are 18-39 years of age, have your blood pressure checked every 3-5 years. If you are 40 years of age or older, have your blood pressure checked every year.  Maintain a healthy weight.  Reducing calorie intake and making food choices that are low in sodium, saturated fat, trans fat, and cholesterol are recommended to manage  weight.  Stop drug abuse.  Avoid taking birth control pills.  Talk to your health care provider about the risks of taking birth control pills if you are over 35 years old, smoke, get migraines, or have ever had a blood clot.  Get evaluated for sleep disorders (sleep apnea).  Talk to your health care provider about getting a sleep evaluation if you snore a lot or have excessive sleepiness.  Take medicines only as directed by your health care provider.  For some people, aspirin or blood thinners (anticoagulants) are helpful in reducing the risk of forming abnormal blood clots that can lead to stroke. If you have the irregular heart rhythm of atrial fibrillation, you should be on a blood thinner unless there is a good reason you cannot take them.  Understand all your medicine instructions.  Make sure that other conditions (such as anemia or atherosclerosis) are addressed. SEEK IMMEDIATE MEDICAL CARE IF:   You have sudden weakness or numbness of the face, arm, or leg, especially on one side of the body.  Your face or eyelid droops to one side.  You have sudden confusion.  You have trouble speaking (aphasia) or understanding.  You have sudden trouble seeing in one or both eyes.  You have sudden trouble walking.  You have dizziness.  You have a loss of balance or coordination.  You have a sudden, severe headache with no known cause.  You have new chest pain or an irregular heartbeat. Any of these symptoms may represent a serious problem that is an emergency. Do not wait to see if the symptoms will   go away. Get medical help at once. Call your local emergency services (911 in [place] Do not drive yourself to the hospital.   This information is not intended to replace advice given to you by your health care provider. Make sure you discuss any questions you have with your health care provider.   Document Released: 12/29/2004 Document Revised: 12/12/2014 Document Reviewed:  05/24/2013 Elsevier Interactive Patient Education 2016 [employer]    Peripheral Vascular Disease Peripheral vascular disease (PVD) is a disease of the blood vessels that are not part of your heart and brain. A simple term for PVD is poor circulation. In most cases, PVD narrows the blood vessels that carry blood from your heart to the rest of your body. This can result in a decreased supply of blood to your arms, legs, and internal organs, like your stomach or kidneys. However, it most often affects a person's lower legs and feet. There are two types of PVD.  Organic PVD. This is the more common type. It is caused by damage to the structure of blood vessels.  Functional PVD. This is caused by conditions that make blood vessels contract and tighten (spasm). Without treatment, PVD tends to get worse over time. PVD can also lead to acute ischemic limb. This is when an arm or limb suddenly has trouble getting enough blood. This is a medical emergency. CAUSES Each type of PVD has many different causes. The most common cause of PVD is buildup of a fatty material (plaque) inside of your arteries (atherosclerosis). Small amounts of plaque can break off from the walls of the blood vessels and become lodged in a smaller artery. This blocks blood flow and can cause acute ischemic limb. Other common causes of PVD include:  Blood clots that form inside of blood vessels.  Injuries to blood vessels.  Diseases that cause inflammation of blood vessels or cause blood vessel spasms.  Health behaviors and health history that increase your risk of developing PVD. RISK FACTORS  You may have a greater risk of PVD if you:  Have a family history of PVD.  Have certain medical conditions, including:  High cholesterol.  Diabetes.  High blood pressure (hypertension).  Coronary heart disease.  Past problems with blood clots.  Past injury, such as burns or a broken bone. These may have damaged blood  vessels in your limbs.  Buerger disease. This is caused by inflamed blood vessels in your hands and feet.  Some forms of arthritis.  Rare birth defects that affect the arteries in your legs.  Use tobacco.  Do not get enough exercise.  Are obese.  Are age 50 or older. SIGNS AND SYMPTOMS  PVD may cause many different symptoms. Your symptoms depend on what part of your body is not getting enough blood. Some common signs and symptoms include:  Cramps in your lower legs. This may be a symptom of poor leg circulation (claudication).  Pain and weakness in your legs while you are physically active that goes away when you rest (intermittent claudication).  Leg pain when at rest.  Leg numbness, tingling, or weakness.  Coldness in a leg or foot, especially when compared with the other leg.  Skin or hair changes. These can include:  Hair loss.  Shiny skin.  Pale or bluish skin.  Thick toenails.  Inability to get or maintain an erection (erectile dysfunction). People with PVD are more prone to developing ulcers and sores on their toes, feet, or legs. These may take longer than   normal to heal. DIAGNOSIS Your health care provider may diagnose PVD from your signs and symptoms. The health care provider will also do a physical exam. You may have tests to find out what is causing your PVD and determine its severity. Tests may include:  Blood pressure recordings from your arms and legs and measurements of the strength of your pulses (pulse volume recordings).  Imaging studies using sound waves to take pictures of the blood flow through your blood vessels (Doppler ultrasound).  Injecting a dye into your blood vessels before having imaging studies using:  X-rays (angiogram or arteriogram).  Computer-generated X-rays (CT angiogram).  A powerful electromagnetic field and a computer (magnetic resonance angiogram or MRA). TREATMENT Treatment for PVD depends on the cause of your condition  and the severity of your symptoms. It also depends on your age. Underlying causes need to be treated and controlled. These include long-lasting (chronic) conditions, such as diabetes, high cholesterol, and high blood pressure. You may need to first try making lifestyle changes and taking medicines. Surgery may be needed if these do not work. Lifestyle changes may include:  Quitting smoking.  Exercising regularly.  Following a low-fat, low-cholesterol diet. Medicines may include:  Blood thinners to prevent blood clots.  Medicines to improve blood flow.  Medicines to improve your blood cholesterol levels. Surgical procedures may include:  A procedure that uses an inflated balloon to open a blocked artery and improve blood flow (angioplasty).  A procedure to put in a tube (stent) to keep a blocked artery open (stent implant).  Surgery to reroute blood flow around a blocked artery (peripheral bypass surgery).  Surgery to remove dead tissue from an infected wound on the affected limb.  Amputation. This is surgical removal of the affected limb. This may be necessary in cases of acute ischemic limb that are not improved through medical or surgical treatments. HOME CARE INSTRUCTIONS  Take medicines only as directed by your health care provider.  Do not use any tobacco products, including cigarettes, chewing tobacco, or electronic cigarettes. If you need help quitting, ask your health care provider.  Lose weight if you are overweight, and maintain a healthy weight as directed by your health care provider.  Eat a diet that is low in fat and cholesterol. If you need help, ask your health care provider.  Exercise regularly. Ask your health care provider to suggest some good activities for you.  Use compression stockings or other mechanical devices as directed by your health care provider.  Take good care of your feet.  Wear comfortable shoes that fit well.  Check your feet often for  any cuts or sores. SEEK MEDICAL CARE IF:  You have cramps in your legs while walking.  You have leg pain when you are at rest.  You have coldness in a leg or foot.  Your skin changes.  You have erectile dysfunction.  You have cuts or sores on your feet that are not healing. SEEK IMMEDIATE MEDICAL CARE IF:  Your arm or leg turns cold and blue.  Your arms or legs become red, warm, swollen, painful, or numb.  You have chest pain or trouble breathing.  You suddenly have weakness in your face, arm, or leg.  You become very confused or lose the ability to speak.  You suddenly have a very bad headache or lose your vision.   This information is not intended to replace advice given to you by your health care provider. Make sure you discuss any questions   you have with your health care provider.   Document Released: 12/29/2004 Document Revised: 12/12/2014 Document Reviewed: 05/01/2014 Elsevier Interactive Patient Education 2016 [employer]    Smoking Cessation, Tips for Success If you are ready to quit smoking, congratulations! You have chosen to help yourself be healthier. Cigarettes bring nicotine, tar, carbon monoxide, and other irritants into your body. Your lungs, heart, and blood vessels will be able to work better without these poisons. There are many different ways to quit smoking. Nicotine gum, nicotine patches, a nicotine inhaler, or nicotine nasal spray can help with physical craving. Hypnosis, support groups, and medicines help break the habit of smoking. WHAT THINGS CAN I DO TO MAKE QUITTING EASIER?  Here are some tips to help you quit for good:  Pick a date when you will quit smoking completely. Tell all of your friends and family about your plan to quit on that date.  Do not try to slowly cut down on the number of cigarettes you are smoking. Pick a quit date and quit smoking completely starting on that day.  Throw away all cigarettes.   Clean and remove all  ashtrays from your home, work, and car.  On a card, write down your reasons for quitting. Carry the card with you and read it when you get the urge to smoke.  Cleanse your body of nicotine. Drink enough water and fluids to keep your urine clear or pale yellow. Do this after quitting to flush the nicotine from your body.  Learn to predict your moods. Do not let a bad situation be your excuse to have a cigarette. Some situations in your life might tempt you into wanting a cigarette.  Never have "just one" cigarette. It leads to wanting another and another. Remind yourself of your decision to quit.  Change habits associated with smoking. If you smoked while driving or when feeling stressed, try other activities to replace smoking. Stand up when drinking your coffee. Brush your teeth after eating. Sit in a different chair when you read the paper. Avoid alcohol while trying to quit, and try to drink fewer caffeinated beverages. Alcohol and caffeine may urge you to smoke.  Avoid foods and drinks that can trigger a desire to smoke, such as sugary or spicy foods and alcohol.  Ask people who smoke not to smoke around you.  Have something planned to do right after eating or having a cup of coffee. For example, plan to take a walk or exercise.  Try a relaxation exercise to calm you down and decrease your stress. Remember, you may be tense and nervous for the first 2 weeks after you quit, but this will pass.  Find new activities to keep your hands busy. Play with a pen, coin, or rubber band. Doodle or draw things on paper.  Brush your teeth right after eating. This will help cut down on the craving for the taste of tobacco after meals. You can also try mouthwash.   Use oral substitutes in place of cigarettes. Try using lemon drops, carrots, cinnamon sticks, or chewing gum. Keep them handy so they are available when you have the urge to smoke.  When you have the urge to smoke, try deep  breathing.  Designate your home as a nonsmoking area.  If you are a heavy smoker, ask your health care provider about a prescription for nicotine chewing gum. It can ease your withdrawal from nicotine.  Reward yourself. Set aside the cigarette money you save and buy yourself   something nice.  Look for support from others. Join a support group or smoking cessation program. Ask someone at home or at work to help you with your plan to quit smoking.  Always ask yourself, "Do I need this cigarette or is this just a reflex?" Tell yourself, "Today, I choose not to smoke," or "I do not want to smoke." You are reminding yourself of your decision to quit.  Do not replace cigarette smoking with electronic cigarettes (commonly called e-cigarettes). The safety of e-cigarettes is unknown, and some may contain harmful chemicals.  If you relapse, do not give up! Plan ahead and think about what you will do the next time you get the urge to smoke. HOW WILL I FEEL WHEN I QUIT SMOKING? You may have symptoms of withdrawal because your body is used to nicotine (the addictive substance in cigarettes). You may crave cigarettes, be irritable, feel very hungry, cough often, get headaches, or have difficulty concentrating. The withdrawal symptoms are only temporary. They are strongest when you first quit but will go away within 10-14 days. When withdrawal symptoms occur, stay in control. Think about your reasons for quitting. Remind yourself that these are signs that your body is healing and getting used to being without cigarettes. Remember that withdrawal symptoms are easier to treat than the major diseases that smoking can cause.  Even after the withdrawal is over, expect periodic urges to smoke. However, these cravings are generally short lived and will go away whether you smoke or not. Do not smoke! WHAT RESOURCES ARE AVAILABLE TO HELP ME QUIT SMOKING? Your health care provider can direct you to community resources or  hospitals for support, which may include:  Group support.  Education.  Hypnosis.  Therapy.   This information is not intended to replace advice given to you by your health care provider. Make sure you discuss any questions you have with your health care provider.   Document Released: 08/19/2004 Document Revised: 12/12/2014 Document Reviewed: 05/09/2013 Elsevier Interactive Patient Education 2016 [employer] Inc[employer]    Steps to Quit Smoking  Smoking tobacco can be harmful to your health and can affect almost every organ in your body. Smoking puts you, and those around you, at risk for developing many serious chronic diseases. Quitting smoking is difficult, but it is one of the best things that you can do for your health. It is never too late to quit. WHAT ARE THE BENEFITS OF QUITTING SMOKING? When you quit smoking, you lower your risk of developing serious diseases and conditions, such as:  Lung cancer or lung disease, such as COPD.  Heart disease.  Stroke.  Heart attack.  Infertility.  Osteoporosis and bone fractures. Additionally, symptoms such as coughing, wheezing, and shortness of breath may get better when you quit. You may also find that you get sick less often because your body is stronger at fighting off colds and infections. If you are pregnant, quitting smoking can help to reduce your chances of having a baby of low birth weight. HOW DO I GET READY TO QUIT? When you decide to quit smoking, create a plan to make sure that you are successful. Before you quit:  Pick a date to quit. Set a date within the next two weeks to give you time to prepare.  Write down the reasons why you are quitting. Keep this list in places where you will see it often, such as on your bathroom mirror or in your car or wallet.  Identify the people, places,   things, and activities that make you want to smoke (triggers) and avoid them. Make sure to take these actions:  Throw away all cigarettes at  home, at work, and in your car.  Throw away smoking accessories, such as ashtrays and lighters.  Clean your car and make sure to empty the ashtray.  Clean your home, including curtains and carpets.  Tell your family, friends, and coworkers that you are quitting. Support from your loved ones can make quitting easier.  Talk with your health care provider about your options for quitting smoking.  Find out what treatment options are covered by your health insurance. WHAT STRATEGIES CAN I USE TO QUIT SMOKING?  Talk with your healthcare provider about different strategies to quit smoking. Some strategies include:  Quitting smoking altogether instead of gradually lessening how much you smoke over a period of time. Research shows that quitting "cold turkey" is more successful than gradually quitting.  Attending in-person counseling to help you build problem-solving skills. You are more likely to have success in quitting if you attend several counseling sessions. Even short sessions of 10 minutes can be effective.  Finding resources and support systems that can help you to quit smoking and remain smoke-free after you quit. These resources are most helpful when you use them often. They can include:  Online chats with a counselor.  Telephone quitlines.  Printed self-help materials.  Support groups or group counseling.  Text messaging programs.  Mobile phone applications.  Taking medicines to help you quit smoking. (If you are pregnant or breastfeeding, talk with your health care provider first.) Some medicines contain nicotine and some do not. Both types of medicines help with cravings, but the medicines that include nicotine help to relieve withdrawal symptoms. Your health care provider may recommend:  Nicotine patches, gum, or lozenges.  Nicotine inhalers or sprays.  Non-nicotine medicine that is taken by mouth. Talk with your health care provider about combining strategies, such as  taking medicines while you are also receiving in-person counseling. Using these two strategies together makes you more likely to succeed in quitting than if you used either strategy on its own. If you are pregnant or breastfeeding, talk with your health care provider about finding counseling or other support strategies to quit smoking. Do not take medicine to help you quit smoking unless told to do so by your health care provider. WHAT THINGS CAN I DO TO MAKE IT EASIER TO QUIT? Quitting smoking might feel overwhelming at first, but there is a lot that you can do to make it easier. Take these important actions:  Reach out to your family and friends and ask that they support and encourage you during this time. Call telephone quitlines, reach out to support groups, or work with a counselor for support.  Ask people who smoke to avoid smoking around you.  Avoid places that trigger you to smoke, such as bars, parties, or smoke-break areas at work.  Spend time around people who do not smoke.  Lessen stress in your life, because stress can be a smoking trigger for some people. To lessen stress, try:  Exercising regularly.  Deep-breathing exercises.  Yoga.  Meditating.  Performing a body scan. This involves closing your eyes, scanning your body from head to toe, and noticing which parts of your body are particularly tense. Purposefully relax the muscles in those areas.  Download or purchase mobile phone or tablet apps (applications) that can help you stick to your quit plan by providing reminders, tips,   and encouragement. There are many free apps, such as QuitGuide from the CDC (Centers for Disease Control and Prevention). You can find other support for quitting smoking (smoking cessation) through smokefree.gov and other websites. HOW WILL I FEEL WHEN I QUIT SMOKING? Within the first 24 hours of quitting smoking, you may start to feel some withdrawal symptoms. These symptoms are usually most  noticeable 2-3 days after quitting, but they usually do not last beyond 2-3 weeks. Changes or symptoms that you might experience include:  Mood swings.  Restlessness, anxiety, or irritation.  Difficulty concentrating.  Dizziness.  Strong cravings for sugary foods in addition to nicotine.  Mild weight gain.  Constipation.  Nausea.  Coughing or a sore throat.  Changes in how your medicines work in your body.  A depressed mood.  Difficulty sleeping (insomnia). After the first 2-3 weeks of quitting, you may start to notice more positive results, such as:  Improved sense of smell and taste.  Decreased coughing and sore throat.  Slower heart rate.  Lower blood pressure.  Clearer skin.  The ability to breathe more easily.  Fewer sick days. Quitting smoking is very challenging for most people. Do not get discouraged if you are not successful the first time. Some people need to make many attempts to quit before they achieve long-term success. Do your best to stick to your quit plan, and talk with your health care provider if you have any questions or concerns.   This information is not intended to replace advice given to you by your health care provider. Make sure you discuss any questions you have with your health care provider.   Document Released: 11/15/2001 Document Revised: 04/07/2015 Document Reviewed: 04/07/2015 Elsevier Interactive Patient Education 2016 [employer]  

## 2016-02-18 NOTE — Progress Notes (Signed)
[facility] [place] HISTORY AND PHYSICAL   MRN : [id number]  History of Present Illness:   [name] is a 70 y.o. male patient of Dr. Oneida Alar who returns today for follow up of his extracranial carotid artery stenosis and peripheral artery occlusive disease. He is s/p right SFA and external iliac stents, left SFA stent which is chronically occluded, left external iliac and left common iliac stents all placed by Dr. Albertine Patricia years ago.  He reports having symptoms of claudication. He can walk so far and he gets numbness and pain in the right foot. He will stop and rest for 90 seconds and the numbness goes away and he can walk more.  Note that he has had lumbar spine surgery x2.   Pt denies non healing wounds in his feet/legs.    He states that both hips give out when he walks, right more so than left, this has remained stable per pt.  He has knee problems which are also a barrier to walking.  He plays golf, rides in a cart.   He had a left CEA in 2007 followed by left carotid stent in 2011 by Dr. Oneida Alar.   His past medical history includes TIA about 2007, states he was evaluated at [facility] at that time, denies any subsequent stroke or TIA. His medical problems include COPD, hypertension, and hypercholesterolemia; chronic kidney disease followed by Dr. Justin Mend.  His cardiologist is Dr. Burt Knack.  He has an allergy to Plavix and takes a full strength Asprin daily, a Statin, and a beta blocker.  Pt meds include: Statin :Yes Betablocker: Yes ASA: Yes Other anticoagulants/antiplatelets: none   Pt Diabetic: No Pt smoker: smoker  (1 ppd, started in his late teens)  Pt meds include: Statin :Yes Betablocker: Yes ASA: Yes Other anticoagulants/antiplatelets: no   Current Outpatient Prescriptions  Medication Sig Dispense Refill  . amLODipine (NORVASC) 10 MG tablet TAKE 1 TABLET BY MOUTH EVERY DAY. 15 tablet 0  . aspirin 325 MG tablet Take 325 mg by mouth  every evening.     . carvedilol (COREG) 12.5 MG tablet TAKE 1 TABLET BY MOUTH TWICE DAILY WITH MEALS. 30 tablet 0  . losartan-hydrochlorothiazide (HYZAAR) 50-12.5 MG per tablet Take 1 tablet by mouth daily.  11  . mycophenolate (CELLCEPT) 250 MG capsule Take 250 mg by mouth 4 (four) times daily.     [name] omeprazole (PRILOSEC) 20 MG capsule TAKE ONE CAPSULE BY MOUTH EVERY DAY AT 6AM    . rosuvastatin (CRESTOR) 10 MG tablet Take 10 mg by mouth every evening.      No current facility-administered medications for this visit.    Past Medical History  Diagnosis Date  . Left carotid stenosis     a. 2007 s/p L CEA;  b. 2011 s/p L common carotid stenting 2/2 restenosis;  c. [id number] u/s: RICA [id number], LICA [id number], patent LCCA stent.  [name] HTN (hypertension)   . Edema   . Hyperlipidemia   . PAD (peripheral artery disease)     a. s/p prior RSFA, REIA, distal LSFA (known to be occluded), LEIA, and LCIA stenting;  b. 01/2015 ABI's: R 0.78, L 0.53.  [name] Stroke   . CKD (chronic kidney disease), stage III   . Dyspnea on exertion     Social History Social History  Substance Use Topics  . Smoking status: Current Every Day Smoker -- 1.00 packs/day for 50 years    Types: Cigarettes  . Smokeless tobacco: Not on  file  . Alcohol Use: 4.2 oz/week    7 Glasses of wine per week    Family History Family History  Problem Relation Age of Onset  . Hypertension Mother   . Hyperlipidemia Mother     Surgical History Past Surgical History  Procedure Laterality Date  . Lumbar disc surgery      x 2  . Carotid endarterecotomy    . Stnt      lower extermity  . External iliac      status post bilateral and left common iliac artery  . Lumbar back surgery      x2  . Tonsillectomy    . Tendon repair      to the left arm  . Abdominal aortagram N/A 02/05/2015    Procedure: ABDOMINAL Maxcine Ham;  Surgeon: Conrad Loyalton, MD;  Location: [facility] CATH LAB;  Service: Cardiovascular;  Laterality: N/A;    Allergies  Allergen  Reactions  . Clopidogrel Rash    (plavix)    Current Outpatient Prescriptions  Medication Sig Dispense Refill  . amLODipine (NORVASC) 10 MG tablet TAKE 1 TABLET BY MOUTH EVERY DAY. 15 tablet 0  . aspirin 325 MG tablet Take 325 mg by mouth every evening.     . carvedilol (COREG) 12.5 MG tablet TAKE 1 TABLET BY MOUTH TWICE DAILY WITH MEALS. 30 tablet 0  . losartan-hydrochlorothiazide (HYZAAR) 50-12.5 MG per tablet Take 1 tablet by mouth daily.  11  . mycophenolate (CELLCEPT) 250 MG capsule Take 250 mg by mouth 4 (four) times daily.     [name] omeprazole (PRILOSEC) 20 MG capsule TAKE ONE CAPSULE BY MOUTH EVERY DAY AT 6AM    . rosuvastatin (CRESTOR) 10 MG tablet Take 10 mg by mouth every evening.      No current facility-administered medications for this visit.     REVIEW OF SYSTEMS: See HPI for pertinent positives and negatives.  Physical Examination Filed Vitals:   02/18/16 1137  BP: 144/82  Pulse: 50  Temp: 97.3 F (36.3 C)  TempSrc: Oral  Resp: 14  Height: 6\' 2"  (1.88 m)  Weight: 214 lb (97.07 kg)  SpO2: 98%   Body mass index is 27.46 kg/(m^2).  General:  WDWN in NAD Gait: Normal HENT: WNL Eyes: Pupils equal Pulmonary: normal non-labored breathing, CTAB Cardiac: RRR, no murmur detected  Abdomen: soft, NT, no masses palpated Skin: no rashes, no ulcers, no cellulitis.  VASCULAR EXAM  Carotid Bruits Right Left   Negative Negative   Aorta is not palpable Radial pulses are 2+ palpable and =.                 VASCULAR EXAM: Extremities without ischemic changes, without Gangrene; without open wounds.                                                                                                          LE Pulses Right Left       FEMORAL  2+ palpable  2+ palpable        POPLITEAL  not palpable  not palpable       POSTERIOR TIBIAL  not palpable   not palpable        DORSALIS PEDIS      ANTERIOR TIBIAL 1+ palpable  not palpable      Musculoskeletal: no muscle  wasting or atrophy; no peripheral edema  Neurologic: A&O X 3; Appropriate Affect ;  MOTOR FUNCTION: 5/5 Symmetric, CN 2-12 intact Speech is fluent/normal    Non-Invasive Vascular Imaging (02/18/2016):  Carotid Duplex: Right ICA stenosis of <40%. Left ICA (CEA site) with restenosis of the proximal segment at 40-59% (high end of range). No significant change compared to prior exam of 01/29/15.  Right LE Arterial Duplex: No color or Doppler flow detected within the right SFA stent; reconstitution noted in the mid to distal SFA with monophasic flow. No prior duplex from this facility for comparison.   ABI   R: 0.60 (0.78, 01/29/15), DP: monophasic, PT: monophasic, TBI: dampened             L: 0.49 (0.53), DP: biphasic, PT: monophasic, TBI: 0.23       ASSESSMENT:  [name] is a 70 y.o. male who is s/p left carotid endarterectomy on 06/22/2006, left common carotid artery stent on 09/10/2010, right SFA and EIA stents placed years ago, and left SFA stent placed years ago with subsequent occlusion.  He had a TIA about 2007, states he was evaluated at [facility] at that time, denies any subsequent stroke or TIA.  He may have radiculopathy sx's and claudication sx's in both LE's. He has a hx of lumbar spine surgery x2.  Today's carotid duplex suggests right ICA stenosis of <40%. Left ICA (CEA site) with restenosis of the proximal segment at 40-59% (high end of range). No significant change compared to prior exam of 01/29/15.  Today's right LE arterial duplex suggests confirmed occlusion of right SFA stent; reconstitution noted in the mid to distal SFA with monophasic flow.  ABI's have worsened bilaterally: moderate arterial occlusive disease in the right and severe in the left.  Fortunately he does not have DM but unfortunately he continues to smoke a ppd.  At pt's 02/18/15 visit Dr. Oneida Alar assessment/plan mentions that if pt has further embolic events consideration would need to be  considered for adding an additional antiplatelet agent. The patient has had Plavix in the past but had an allergic reaction with a rash to this. We might need to consider Brilinta if he has another atheroembolic event.  Face to face time with patient was 25 minutes. Over 50% of this time was spent on counseling and coordination of care.    PLAN:   The patient was counseled re smoking cessation and given several free resources re smoking cessation.   Based on today's exam and non-invasive vascular lab results, the patient will follow up in 1 year with the following tests: ABI's, right LE arterial duplex, and carotid duplex. I advised pt and wife to notify us if he develops concerns re the circulation in his legs.  I discussed in depth with the patient the nature of atherosclerosis, and emphasized the importance of maximal medical management including strict control of blood pressure, blood glucose, and lipid levels, obtaining regular exercise, and cessation of smoking.  The patient is aware that without maximal medical management the underlying atherosclerotic disease process will progress, limiting the benefit of any interventions.  The patient was given information about stroke prevention and what symptoms should prompt the patient to seek immediate medical  care. . The patient was given information about PAD including signs, symptoms, treatment, what symptoms should prompt the patient to seek immediate medical care, and risk reduction measures to take. Thank you for allowing [place] to participate in this patient's care.  Clemon Chambers, RN, MSN, FNP-C [facility] Office: [phone]  Clinic MD: Scot Dock  02/18/2016 11:41 AM

## 2016-02-19 NOTE — Addendum Note (Signed)
Addended by: Thresa Ross C on: 02/19/2016 11:38 AM   Modules accepted: Orders

## 2016-03-04 DIAGNOSIS — E785 Hyperlipidemia, unspecified: Secondary | ICD-10-CM | POA: Diagnosis not present

## 2016-03-04 DIAGNOSIS — N189 Chronic kidney disease, unspecified: Secondary | ICD-10-CM | POA: Diagnosis not present

## 2016-03-04 DIAGNOSIS — N055 Unspecified nephritic syndrome with diffuse mesangiocapillary glomerulonephritis: Secondary | ICD-10-CM | POA: Diagnosis not present

## 2016-03-04 DIAGNOSIS — D631 Anemia in chronic kidney disease: Secondary | ICD-10-CM | POA: Diagnosis not present

## 2016-03-21 DIAGNOSIS — G8929 Other chronic pain: Secondary | ICD-10-CM | POA: Diagnosis not present

## 2016-03-21 DIAGNOSIS — N183 Chronic kidney disease, stage 3 (moderate): Secondary | ICD-10-CM | POA: Diagnosis not present

## 2016-03-21 DIAGNOSIS — J439 Emphysema, unspecified: Secondary | ICD-10-CM | POA: Diagnosis not present

## 2016-03-21 DIAGNOSIS — I739 Peripheral vascular disease, unspecified: Secondary | ICD-10-CM | POA: Diagnosis not present

## 2016-03-21 DIAGNOSIS — I10 Essential (primary) hypertension: Secondary | ICD-10-CM | POA: Diagnosis not present

## 2016-03-21 DIAGNOSIS — I779 Disorder of arteries and arterioles, unspecified: Secondary | ICD-10-CM | POA: Diagnosis not present

## 2016-03-21 DIAGNOSIS — M25561 Pain in right knee: Secondary | ICD-10-CM | POA: Diagnosis not present

## 2016-07-05 DIAGNOSIS — N183 Chronic kidney disease, stage 3 (moderate): Secondary | ICD-10-CM | POA: Diagnosis not present

## 2016-07-05 DIAGNOSIS — J439 Emphysema, unspecified: Secondary | ICD-10-CM | POA: Diagnosis not present

## 2016-07-05 DIAGNOSIS — I10 Essential (primary) hypertension: Secondary | ICD-10-CM | POA: Diagnosis not present

## 2016-07-05 DIAGNOSIS — I739 Peripheral vascular disease, unspecified: Secondary | ICD-10-CM | POA: Diagnosis not present

## 2016-07-05 DIAGNOSIS — G8929 Other chronic pain: Secondary | ICD-10-CM | POA: Diagnosis not present

## 2016-09-12 DIAGNOSIS — N055 Unspecified nephritic syndrome with diffuse mesangiocapillary glomerulonephritis: Secondary | ICD-10-CM | POA: Diagnosis not present

## 2016-09-12 DIAGNOSIS — Z6828 Body mass index (BMI) 28.0-28.9, adult: Secondary | ICD-10-CM | POA: Diagnosis not present

## 2016-09-12 DIAGNOSIS — E785 Hyperlipidemia, unspecified: Secondary | ICD-10-CM | POA: Diagnosis not present

## 2016-09-12 DIAGNOSIS — D631 Anemia in chronic kidney disease: Secondary | ICD-10-CM | POA: Diagnosis not present

## 2016-09-12 DIAGNOSIS — N189 Chronic kidney disease, unspecified: Secondary | ICD-10-CM | POA: Diagnosis not present

## 2016-11-08 DIAGNOSIS — N529 Male erectile dysfunction, unspecified: Secondary | ICD-10-CM | POA: Diagnosis not present

## 2016-11-08 DIAGNOSIS — G8929 Other chronic pain: Secondary | ICD-10-CM | POA: Diagnosis not present

## 2016-11-08 DIAGNOSIS — J441 Chronic obstructive pulmonary disease with (acute) exacerbation: Secondary | ICD-10-CM | POA: Diagnosis not present

## 2016-11-08 DIAGNOSIS — J329 Chronic sinusitis, unspecified: Secondary | ICD-10-CM | POA: Diagnosis not present

## 2016-11-08 DIAGNOSIS — E538 Deficiency of other specified B group vitamins: Secondary | ICD-10-CM | POA: Diagnosis not present

## 2016-11-08 DIAGNOSIS — N183 Chronic kidney disease, stage 3 (moderate): Secondary | ICD-10-CM | POA: Diagnosis not present

## 2016-11-08 DIAGNOSIS — Z0000 Encounter for general adult medical examination without abnormal findings: Secondary | ICD-10-CM | POA: Diagnosis not present

## 2016-11-08 DIAGNOSIS — M5431 Sciatica, right side: Secondary | ICD-10-CM | POA: Diagnosis not present

## 2016-11-08 DIAGNOSIS — Z7289 Other problems related to lifestyle: Secondary | ICD-10-CM | POA: Diagnosis not present

## 2016-11-08 DIAGNOSIS — Z23 Encounter for immunization: Secondary | ICD-10-CM | POA: Diagnosis not present

## 2016-11-08 DIAGNOSIS — E785 Hyperlipidemia, unspecified: Secondary | ICD-10-CM | POA: Diagnosis not present

## 2016-11-08 DIAGNOSIS — I10 Essential (primary) hypertension: Secondary | ICD-10-CM | POA: Diagnosis not present

## 2016-11-08 DIAGNOSIS — M1711 Unilateral primary osteoarthritis, right knee: Secondary | ICD-10-CM | POA: Diagnosis not present

## 2017-02-07 DIAGNOSIS — J441 Chronic obstructive pulmonary disease with (acute) exacerbation: Secondary | ICD-10-CM | POA: Diagnosis not present

## 2017-02-07 DIAGNOSIS — M5431 Sciatica, right side: Secondary | ICD-10-CM | POA: Diagnosis not present

## 2017-02-07 DIAGNOSIS — M1711 Unilateral primary osteoarthritis, right knee: Secondary | ICD-10-CM | POA: Diagnosis not present

## 2017-02-07 DIAGNOSIS — I779 Disorder of arteries and arterioles, unspecified: Secondary | ICD-10-CM | POA: Diagnosis not present

## 2017-02-07 DIAGNOSIS — I739 Peripheral vascular disease, unspecified: Secondary | ICD-10-CM | POA: Diagnosis not present

## 2017-02-07 DIAGNOSIS — N183 Chronic kidney disease, stage 3 (moderate): Secondary | ICD-10-CM | POA: Diagnosis not present

## 2017-02-07 DIAGNOSIS — I10 Essential (primary) hypertension: Secondary | ICD-10-CM | POA: Diagnosis not present

## 2017-02-10 ENCOUNTER — Encounter: Payer: Self-pay | Admitting: Family

## 2017-02-23 ENCOUNTER — Ambulatory Visit (INDEPENDENT_AMBULATORY_CARE_PROVIDER_SITE_OTHER)
Admission: RE | Admit: 2017-02-23 | Discharge: 2017-02-23 | Disposition: A | Discharge status: Home / Self Care | Payer: Medicare Other | Source: Ambulatory Visit | Attending: Family | Admitting: Family

## 2017-02-23 ENCOUNTER — Ambulatory Visit (HOSPITAL_COMMUNITY)
Admission: RE | Admit: 2017-02-23 | Discharge: 2017-02-23 | Disposition: A | Discharge status: Home / Self Care | Payer: Medicare Other | Source: Ambulatory Visit | Attending: Family | Admitting: Family

## 2017-02-23 ENCOUNTER — Other Ambulatory Visit: Payer: Self-pay | Admitting: Family

## 2017-02-23 ENCOUNTER — Ambulatory Visit (INDEPENDENT_AMBULATORY_CARE_PROVIDER_SITE_OTHER): Payer: Medicare Other | Admitting: Family

## 2017-02-23 ENCOUNTER — Encounter: Payer: Self-pay | Admitting: Family

## 2017-02-23 VITALS — BP 139/70 | HR 59 | Temp 98.7°F | Resp 18 | Ht 73.0 in | Wt 210.0 lb

## 2017-02-23 DIAGNOSIS — Z48812 Encounter for surgical aftercare following surgery on the circulatory system: Secondary | ICD-10-CM | POA: Insufficient documentation

## 2017-02-23 DIAGNOSIS — Z95828 Presence of other vascular implants and grafts: Secondary | ICD-10-CM

## 2017-02-23 DIAGNOSIS — F17200 Nicotine dependence, unspecified, uncomplicated: Secondary | ICD-10-CM

## 2017-02-23 DIAGNOSIS — I6523 Occlusion and stenosis of bilateral carotid arteries: Secondary | ICD-10-CM | POA: Diagnosis not present

## 2017-02-23 DIAGNOSIS — I739 Peripheral vascular disease, unspecified: Secondary | ICD-10-CM

## 2017-02-23 DIAGNOSIS — Z9889 Other specified postprocedural states: Secondary | ICD-10-CM

## 2017-02-23 LAB — VAS US LOWER EXTREMITY ARTERIAL DUPLEX
RIGHT POST TIB DIST SYS: 40 cm/s
RPERPSV: 8 cm/s
RPOPDPSV: -40 cm/s
RSFDPSV: -56 cm/s
RSFMPSV: -117 cm/s

## 2017-02-23 LAB — VAS US CAROTID
LEFT ECA DIAS: -12 cm/s
LICAPDIAS: 75 cm/s
Left CCA dist dias: 21 cm/s
Left CCA dist sys: 163 cm/s
Left CCA prox dias: 12 cm/s
Left CCA prox sys: 89 cm/s
Left ICA dist dias: -32 cm/s
Left ICA dist sys: -111 cm/s
Left ICA prox sys: 284 cm/s
RCCADSYS: -82 cm/s
RCCAPDIAS: 10 cm/s
RIGHT CCA MID DIAS: 14 cm/s
RIGHT ECA DIAS: -11 cm/s
Right CCA prox sys: 98 cm/s

## 2017-02-23 NOTE — Progress Notes (Signed)
[facility] [place] HISTORY AND PHYSICAL   MRN : [id number]  History of Present Illness:   [name] is a 71 y.o. male patient of Dr. Oneida Alar who returns today for follow up of his extracranial carotid artery stenosis and peripheral artery occlusive disease. He is s/p right SFA and external iliac stents, left SFA stent which is chronically occluded, left external iliac and left common iliac stents all placed by Dr. Albertine Patricia years ago.  He reports having symptoms of claudication. He can walk so far and he gets numbness and pain in the right foot. He will stop and rest for 90 seconds and the numbness goes away and he can walk more.  Note that he has had lumbar spine surgery x2.   Pt denies non healing wounds in his feet/legs.    He states that both hips give out when he walks, right more so than left, this has remained stable per pt.  He has knee problems which are also a barrier to walking.  He plays golf, rides in a cart.   He had a left CEA in 2007 followed by left carotid stent in 2011 by Dr. Oneida Alar.   His past medical history includes TIA about 2007, states he was evaluated at [facility] at that time, denies any subsequent stroke or TIA. His medical problems include COPD, hypertension, and hypercholesterolemia; chronic kidney disease followed by Dr. Justin Mend.  His cardiologist is Dr. Burt Knack.  He has an allergy to Plavix and takes a full strength Asprin daily, a Statin, and a beta blocker.  Pt meds include: Statin :Yes Betablocker: Yes ASA: Yes Other anticoagulants/antiplatelets: none   Pt Diabetic: No Pt smoker: smoker  (1 ppd, started in his late teens)  Pt meds include: Statin :Yes Betablocker: Yes ASA: Yes Other anticoagulants/antiplatelets: no    Current Outpatient Prescriptions  Medication Sig Dispense Refill  . amLODipine (NORVASC) 10 MG tablet TAKE 1 TABLET BY MOUTH EVERY DAY. 15 tablet 0  . aspirin 325 MG tablet Take 325 mg by  mouth every evening.     . carvedilol (COREG) 12.5 MG tablet TAKE 1 TABLET BY MOUTH TWICE DAILY WITH MEALS. 30 tablet 0  . losartan-hydrochlorothiazide (HYZAAR) 50-12.5 MG per tablet Take 1 tablet by mouth daily.  11  . mycophenolate (CELLCEPT) 250 MG capsule Take 250 mg by mouth 4 (four) times daily. Reported on 02/18/2016    . omeprazole (PRILOSEC) 20 MG capsule TAKE ONE CAPSULE BY MOUTH EVERY DAY AT 6AM    . rosuvastatin (CRESTOR) 10 MG tablet Take 10 mg by mouth every evening.      No current facility-administered medications for this visit.     Past Medical History:  Diagnosis Date  . CKD (chronic kidney disease), stage III   . Dyspnea on exertion   . Edema   . HTN (hypertension)   . Hyperlipidemia   . Left carotid stenosis    a. 2007 s/p L CEA;  b. 2011 s/p L common carotid stenting 2/2 restenosis;  c. 12/1939 u/s: RICA <74, LICA 08-14%, patent LCCA stent.  [name] PAD (peripheral artery disease) (HCC)    a. s/p prior RSFA, REIA, distal LSFA (known to be occluded), LEIA, and LCIA stenting;  b. 01/2015 ABI's: R 0.78, L 0.53.  [name] Stroke [facility])     Social History Social History  Substance Use Topics  . Smoking status: Current Every Day Smoker    Packs/day: 1.00    Years: 50.00  Types: Cigarettes  . Smokeless tobacco: Never Used  . Alcohol use 4.2 oz/week    7 Glasses of wine per week    Family History Family History  Problem Relation Age of Onset  . Hypertension Mother   . Hyperlipidemia Mother     Surgical History Past Surgical History:  Procedure Laterality Date  . ABDOMINAL AORTAGRAM N/A 02/05/2015   Procedure: ABDOMINAL Maxcine Ham;  Surgeon: Conrad [place], MD;  Location[facility] CATH LAB;  Service: Cardiovascular;  Laterality: N/A;  . carotid endarterecotomy    . external iliac     status post bilateral and left common iliac artery  . lumbar back surgery     x2  . LUMBAR DISC SURGERY     x 2  . stnt     lower extermity  . TENDON REPAIR     to the left arm  .  TONSILLECTOMY      Allergies  Allergen Reactions  . Clopidogrel Rash    (plavix)    Current Outpatient Prescriptions  Medication Sig Dispense Refill  . amLODipine (NORVASC) 10 MG tablet TAKE 1 TABLET BY MOUTH EVERY DAY. 15 tablet 0  . aspirin 325 MG tablet Take 325 mg by mouth every evening.     . carvedilol (COREG) 12.5 MG tablet TAKE 1 TABLET BY MOUTH TWICE DAILY WITH MEALS. 30 tablet 0  . losartan-hydrochlorothiazide (HYZAAR) 50-12.5 MG per tablet Take 1 tablet by mouth daily.  11  . mycophenolate (CELLCEPT) 250 MG capsule Take 250 mg by mouth 4 (four) times daily. Reported on 02/18/2016    . omeprazole (PRILOSEC) 20 MG capsule TAKE ONE CAPSULE BY MOUTH EVERY DAY AT 6AM    . rosuvastatin (CRESTOR) 10 MG tablet Take 10 mg by mouth every evening.      No current facility-administered medications for this visit.      REVIEW OF SYSTEMS: See HPI for pertinent positives and negatives.  Physical Examination Vitals:   02/23/17 1502 02/23/17 1504  BP: 140/71 139/70  Pulse: 61 (!) 59  Resp: 18   Temp: 98.7 F (37.1 C)   SpO2: 100%   Weight: 210 lb (95.3 kg)   Height: 6\' 1"  (1.854 m)    Body mass index is 27.71 kg/m.  General:  WDWN in NAD Gait: Normal HENT: WNL Eyes: Pupils equal Pulmonary: normal non-labored breathing, CTAB Cardiac: RRR, no murmur detected  Abdomen: soft, NT, no masses palpated Skin: no rashes, no ulcers, no cellulitis.  VASCULAR EXAM  Carotid Bruits Right Left   Negative Negative   Aorta is not palpable Radial pulses are 2+ palpable and =.                 VASCULAR EXAM: Extremities without ischemic changes, without Gangrene; without open wounds.  LE Pulses Right Left       FEMORAL  2+ palpable  2+ palpable        POPLITEAL  not palpable   not palpable       POSTERIOR TIBIAL  not  palpable   not palpable        DORSALIS PEDIS      ANTERIOR TIBIAL not palpable  not palpable     Musculoskeletal: no muscle wasting or atrophy; no peripheral edema          Neurologic: A&O X 3; Appropriate Affect ;  MOTOR FUNCTION: 5/5 Symmetric, CN 2-12 intact Speech is fluent/normal     ASSESSMENT:  [name] is a 71 y.o. male who is s/p left carotid endarterectomy on 06/22/2006, left common carotid artery stent on 09/10/2010, right SFA and EIA stents placed years ago, and left SFA stent placed years ago with subsequent occlusion.  He had a TIA about 2007, states he was evaluated at [facility] at that time, denies any subsequent stroke or TIA.  He may have radiculopathy sx's and claudication sx's in both LE's. He has a hx of lumbar spine surgery x2.  Fortunately he does not have DM but unfortunately he continues to smoke a ppd.  At pt's 02/18/15 visit Dr. Oneida Alar assessment/plan mentions that if pt has further embolic events, would need to consider adding an additional antiplatelet agent. The patient has had Plavix in the past but had an allergic reaction with a rash to this. We might need to consider Brilinta if he has another atheroembolic event.    DATA Today's carotid duplex suggests right ICA stenosis of <40%. Left ICA (CEA site) with restenosis of the proximal segment at 50-69% (low end of range). Bilateral vertebral artery flow is antegrade.  Bilateral subclavian artery waveforms are normal.  Mild increased stenosis in the left ICA compared to the last exam on 02-18-16.  Today's right LE arterial duplex suggests confirmed occlusion of right SFA stent; reconstitution noted in the mid to distal SFA with monophasic flow. 301 cm/s velocity in right CFA (352 cm/s on 02-18-16).  ABI's are stable bilaterally: moderate arterial occlusive disease bilaterally.   PLAN:   The patient was counseled re smoking cessation and given several free resources re smoking  cessation.  Graduated walking program discussed and how to achieve.    Based on today's exam and non-invasive vascular lab results, and after discussing with Dr. Oneida Alar, the patient will follow up in 6 months with the following tests: ABI's, bilateral aortoiliac stent duplex, and carotid duplex.   I discussed in depth with the patient the nature of atherosclerosis, and emphasized the importance of maximal medical management including strict control of blood pressure, blood glucose, and lipid levels, obtaining regular exercise, and cessation of smoking.  The patient is aware that without maximal medical management the underlying atherosclerotic disease process will progress, limiting the benefit of any interventions.  The patient was given information about stroke prevention and what symptoms should prompt the patient to seek immediate medical care.  The patient was given information about PAD including signs, symptoms, treatment, what symptoms should prompt the patient to seek immediate medical care, and risk reduction measures to take. Thank you for allowing [place] to participate in this patient's care.  Clemon Chambers, RN, MSN, FNP-C [facility] Office: [phone]  Clinic MD: [facility] 02/23/2017 3:07 PM

## 2017-02-23 NOTE — Patient Instructions (Addendum)
Stroke Prevention Some medical conditions and behaviors are associated with an increased chance of having a stroke. You may prevent a stroke by making healthy choices and managing medical conditions. How can I reduce my risk of having a stroke?  Stay physically active. Get at least 30 minutes of activity on most or all days.  Do not smoke. It may also be helpful to avoid exposure to secondhand smoke.  Limit alcohol use. Moderate alcohol use is considered to be:  No more than 2 drinks per day for men.  No more than 1 drink per day for nonpregnant women.  Eat healthy foods. This involves:  Eating 5 or more servings of fruits and vegetables a day.  Making dietary changes that address high blood pressure (hypertension), high cholesterol, diabetes, or obesity.  Manage your cholesterol levels.  Making food choices that are high in fiber and low in saturated fat, trans fat, and cholesterol may control cholesterol levels.  Take any prescribed medicines to control cholesterol as directed by your health care provider.  Manage your diabetes.  Controlling your carbohydrate and sugar intake is recommended to manage diabetes.  Take any prescribed medicines to control diabetes as directed by your health care provider.  Control your hypertension.  Making food choices that are low in salt (sodium), saturated fat, trans fat, and cholesterol is recommended to manage hypertension.  Ask your health care provider if you need treatment to lower your blood pressure. Take any prescribed medicines to control hypertension as directed by your health care provider.  If you are 18-39 years of age, have your blood pressure checked every 3-5 years. If you are 40 years of age or older, have your blood pressure checked every year.  Maintain a healthy weight.  Reducing calorie intake and making food choices that are low in sodium, saturated fat, trans fat, and cholesterol are recommended to manage  weight.  Stop drug abuse.  Avoid taking birth control pills.  Talk to your health care provider about the risks of taking birth control pills if you are over 35 years old, smoke, get migraines, or have ever had a blood clot.  Get evaluated for sleep disorders (sleep apnea).  Talk to your health care provider about getting a sleep evaluation if you snore a lot or have excessive sleepiness.  Take medicines only as directed by your health care provider.  For some people, aspirin or blood thinners (anticoagulants) are helpful in reducing the risk of forming abnormal blood clots that can lead to stroke. If you have the irregular heart rhythm of atrial fibrillation, you should be on a blood thinner unless there is a good reason you cannot take them.  Understand all your medicine instructions.  Make sure that other conditions (such as anemia or atherosclerosis) are addressed. Get help right away if:  You have sudden weakness or numbness of the face, arm, or leg, especially on one side of the body.  Your face or eyelid droops to one side.  You have sudden confusion.  You have trouble speaking (aphasia) or understanding.  You have sudden trouble seeing in one or both eyes.  You have sudden trouble walking.  You have dizziness.  You have a loss of balance or coordination.  You have a sudden, severe headache with no known cause.  You have new chest pain or an irregular heartbeat. Any of these symptoms may represent a serious problem that is an emergency. Do not wait to see if the symptoms will go away.   Get medical help at once. Call your local emergency services (911 in [place] Do not drive yourself to the hospital. This information is not intended to replace advice given to you by your health care provider. Make sure you discuss any questions you have with your health care provider. Document Released: 12/29/2004 Document Revised: 04/28/2016 Document Reviewed: 05/24/2013 Elsevier  Interactive Patient Education  2017 [employer]     Preventing Cerebrovascular Disease Arteries are blood vessels that carry blood that contains oxygen from the heart to all parts of the body. Cerebrovascular disease affects arteries that supply the brain. Any condition that blocks or disrupts blood flow to the brain can cause cerebrovascular disease. Brain cells that lose blood supply start to die within minutes (stroke). Stroke is the main danger of cerebrovascular disease. Atherosclerosis and high blood pressure are common causes of cerebrovascular disease. Atherosclerosis is narrowing and hardening of an artery that results when fat, cholesterol, calcium, or other substances (plaque) build up inside an artery. Plaque reduces blood flow through the artery. High blood pressure increases the risk of bleeding inside the brain. Making diet and lifestyle changes to prevent atherosclerosis and high blood pressure lowers your risk of cerebrovascular disease. What nutrition changes can be made?  Eat more fruits, vegetables, and whole grains.  Reduce how much saturated fat you eat. To do this, eat less red meat and fewer full-fat dairy products.  Eat healthy proteins instead of red meat. Healthy proteins include:  Fish. Eat fish that contains heart-healthy omega-3 fatty acids, twice a week. Examples include salmon, albacore tuna, mackerel, and herring.  Chicken.  Nuts.  Low-fat or nonfat yogurt.  Avoid processed meats, like bacon and lunchmeat.  Avoid foods that contain:  A lot of sugar, such as sweets and drinks with added sugar.  A lot of salt (sodium). Avoid adding extra salt to your food, as told by your health care provider.  Trans fats, such as margarine and baked goods. Trans fats may be listed as "partially hydrogenated oils" on food labels.  Check food labels to see how much sodium, sugar, and trans fats are in foods.  Use vegetable oils that contain low amounts of  saturated fat, such as olive oil or canola oil. What lifestyle changes can be made?  Drink alcohol in moderation. This means no more than 1 drink a day for nonpregnant women and 2 drinks a day for men. One drink equals 12 oz of beer, 5 oz of wine, or 1 oz of hard liquor.  If you are overweight, ask your health care provider to recommend a weight-loss plan for you. Losing 5-10 lb (2.2-4.5 kg) can reduce your risk of diabetes, atherosclerosis, and high blood pressure.  Exercise for 30?60 minutes on most days, or as much as told by your health care provider.  Do moderate-intensity exercise, such as brisk walking, bicycling, and water aerobics. Ask your health care provider which activities are safe for you.  Do not use any products that contain nicotine or tobacco, such as cigarettes and e-cigarettes. If you need help quitting, ask your health care provider. Why are these changes important? Making these changes lowers your risk of many diseases that can cause cerebrovascular disease and stroke. Stroke is a leading cause of death and disability. Making these changes also improves your overall health and quality of life. What can I do to lower my risk? The following factors make you more likely to develop cerebrovascular disease:  Being overweight.  Smoking.  Being physically inactive.    Eating a high-fat diet.  Having certain health conditions, such as:  Diabetes.  High blood pressure.  Heart disease.  Atherosclerosis.  High cholesterol.  Sickle cell disease. Talk with your health care provider about your risk for cerebrovascular disease. Work with your health care provider to control diseases that you have that may contribute to cerebrovascular disease. Your health care provider may prescribe medicines to help prevent major causes of cerebrovascular disease. Where to find more information: Learn more about preventing cerebrovascular disease from:  [place], Lung, and  [place]: Mo[employer]st.de  Centers for Disease Control and Prevention: http://www.curry-wood.biz/ Summary  Cerebrovascular disease can lead to a stroke.  Atherosclerosis and high blood pressure are major causes of cerebrovascular disease.  Making diet and lifestyle changes can reduce your risk of cerebrovascular disease.  Work with your health care provider to get your risk factors under control to reduce your risk of cerebrovascular disease. This information is not intended to replace advice given to you by your health care provider. Make sure you discuss any questions you have with your health care provider. Document Released: 12/06/2015 Document Revised: 06/10/2016 Document Reviewed: 12/06/2015 Elsevier Interactive Patient Education  2017 [place].     Peripheral Vascular Disease Peripheral vascular disease (PVD) is a disease of the blood vessels that are not part of your heart and brain. A simple term for PVD is poor circulation. In most cases, PVD narrows the blood vessels that carry blood from your heart to the rest of your body. This can result in a decreased supply of blood to your arms, legs, and internal organs, like your stomach or kidneys. However, it most often affects a person's lower legs and feet. There are two types of PVD.  Organic PVD. This is the more common type. It is caused by damage to the structure of blood vessels.  Functional PVD. This is caused by conditions that make blood vessels contract and tighten (spasm). Without treatment, PVD tends to get worse over time. PVD can also lead to acute ischemic limb. This is when an arm or limb suddenly has trouble getting enough blood. This is a medical emergency. Follow these instructions at home:  Take medicines only as told by your doctor.  Do not use any tobacco products, including cigarettes, chewing tobacco, or electronic cigarettes. If you need help quitting, ask  your doctor.  Lose weight if you are overweight, and maintain a healthy weight as told by your doctor.  Eat a diet that is low in fat and cholesterol. If you need help, ask your doctor.  Exercise regularly. Ask your doctor for some good activities for you.  Take good care of your feet.  Wear comfortable shoes that fit well.  Check your feet often for any cuts or sores. Contact a doctor if:  You have cramps in your legs while walking.  You have leg pain when you are at rest.  You have coldness in a leg or foot.  Your skin changes.  You are unable to get or have an erection (erectile dysfunction).  You have cuts or sores on your feet that are not healing. Get help right away if:  Your arm or leg turns cold and blue.  Your arms or legs become red, warm, swollen, painful, or numb.  You have chest pain or trouble breathing.  You suddenly have weakness in your face, arm, or leg.  You become very confused or you cannot speak.  You suddenly have a very bad headache.  You suddenly cannot see. This information is not intended to replace advice given to you by your health care provider. Make sure you discuss any questions you have with your health care provider. Document Released: 02/15/2010 Document Revised: 04/28/2016 Document Reviewed: 05/01/2014 Elsevier Interactive Patient Education  2017 [employer].      Steps to Quit Smoking Smoking tobacco can be bad for your health. It can also affect almost every organ in your body. Smoking puts you and people around you at risk for many serious long-lasting (chronic) diseases. Quitting smoking is hard, but it is one of the best things that you can do for your health. It is never too late to quit. What are the benefits of quitting smoking? When you quit smoking, you lower your risk for getting serious diseases and conditions. They can include:  Lung cancer or lung disease.  Heart disease.  Stroke.  Heart attack.  Not  being able to have children (infertility).  Weak bones (osteoporosis) and broken bones (fractures). If you have coughing, wheezing, and shortness of breath, those symptoms may get better when you quit. You may also get sick less often. If you are pregnant, quitting smoking can help to lower your chances of having a baby of low birth weight. What can I do to help me quit smoking? Talk with your doctor about what can help you quit smoking. Some things you can do (strategies) include:  Quitting smoking totally, instead of slowly cutting back how much you smoke over a period of time.  Going to in-person counseling. You are more likely to quit if you go to many counseling sessions.  Using resources and support systems, such as:  Online chats with a Social worker.  Phone quitlines.  Printed Furniture conservator/restorer.  Support groups or group counseling.  Text messaging programs.  Mobile phone apps or applications.  Taking medicines. Some of these medicines may have nicotine in them. If you are pregnant or breastfeeding, do not take any medicines to quit smoking unless your doctor says it is okay. Talk with your doctor about counseling or other things that can help you. Talk with your doctor about using more than one strategy at the same time, such as taking medicines while you are also going to in-person counseling. This can help make quitting easier. What things can I do to make it easier to quit? Quitting smoking might feel very hard at first, but there is a lot that you can do to make it easier. Take these steps:  Talk to your family and friends. Ask them to support and encourage you.  Call phone quitlines, reach out to support groups, or work with a Social worker.  Ask people who smoke to not smoke around you.  Avoid places that make you want (trigger) to smoke, such as:  Bars.  Parties.  Smoke-break areas at work.  Spend time with people who do not smoke.  Lower the stress in your  life. Stress can make you want to smoke. Try these things to help your stress:  Getting regular exercise.  Deep-breathing exercises.  Yoga.  Meditating.  Doing a body scan. To do this, close your eyes, focus on one area of your body at a time from head to toe, and notice which parts of your body are tense. Try to relax the muscles in those areas.  Download or buy apps on your mobile phone or tablet that can help you stick to your quit plan. There are many free apps, such as  QuitGuide from the CDC [employer] for Disease Control and Prevention). You can find more support from smokefree.gov and other websites. This information is not intended to replace advice given to you by your health care provider. Make sure you discuss any questions you have with your health care provider. Document Released: 09/17/2009 Document Revised: 07/19/2016 Document Reviewed: 04/07/2015 Elsevier Interactive Patient Education  2017 [place].    Before your next abdominal ultrasound:  Take two Extra-Strength Gas-X capsules at bedtime the night before the test. Take another two Extra-Strength Gas-X capsules 3 hours before the test.

## 2017-03-01 NOTE — Addendum Note (Signed)
Addended by: Lianne Cure A on: 03/01/2017 09:25 AM   Modules accepted: Orders

## 2017-03-17 DIAGNOSIS — D631 Anemia in chronic kidney disease: Secondary | ICD-10-CM | POA: Diagnosis not present

## 2017-03-17 DIAGNOSIS — E785 Hyperlipidemia, unspecified: Secondary | ICD-10-CM | POA: Diagnosis not present

## 2017-03-17 DIAGNOSIS — N189 Chronic kidney disease, unspecified: Secondary | ICD-10-CM | POA: Diagnosis not present

## 2017-03-17 DIAGNOSIS — Z720 Tobacco use: Secondary | ICD-10-CM | POA: Diagnosis not present

## 2017-03-17 DIAGNOSIS — N055 Unspecified nephritic syndrome with diffuse mesangiocapillary glomerulonephritis: Secondary | ICD-10-CM | POA: Diagnosis not present

## 2017-05-09 DIAGNOSIS — J449 Chronic obstructive pulmonary disease, unspecified: Secondary | ICD-10-CM | POA: Diagnosis not present

## 2017-05-09 DIAGNOSIS — Z5181 Encounter for therapeutic drug level monitoring: Secondary | ICD-10-CM | POA: Diagnosis not present

## 2017-05-09 DIAGNOSIS — R252 Cramp and spasm: Secondary | ICD-10-CM | POA: Diagnosis not present

## 2017-05-09 DIAGNOSIS — M1711 Unilateral primary osteoarthritis, right knee: Secondary | ICD-10-CM | POA: Diagnosis not present

## 2017-05-09 DIAGNOSIS — M5431 Sciatica, right side: Secondary | ICD-10-CM | POA: Diagnosis not present

## 2017-05-09 DIAGNOSIS — I129 Hypertensive chronic kidney disease with stage 1 through stage 4 chronic kidney disease, or unspecified chronic kidney disease: Secondary | ICD-10-CM | POA: Diagnosis not present

## 2017-05-09 DIAGNOSIS — I10 Essential (primary) hypertension: Secondary | ICD-10-CM | POA: Diagnosis not present

## 2017-05-09 DIAGNOSIS — N183 Chronic kidney disease, stage 3 (moderate): Secondary | ICD-10-CM | POA: Diagnosis not present

## 2017-08-04 DIAGNOSIS — J329 Chronic sinusitis, unspecified: Secondary | ICD-10-CM | POA: Diagnosis not present

## 2017-08-04 DIAGNOSIS — M5431 Sciatica, right side: Secondary | ICD-10-CM | POA: Diagnosis not present

## 2017-08-04 DIAGNOSIS — J441 Chronic obstructive pulmonary disease with (acute) exacerbation: Secondary | ICD-10-CM | POA: Diagnosis not present

## 2017-08-31 ENCOUNTER — Ambulatory Visit (INDEPENDENT_AMBULATORY_CARE_PROVIDER_SITE_OTHER): Payer: Medicare Other | Admitting: Family

## 2017-08-31 ENCOUNTER — Ambulatory Visit (HOSPITAL_COMMUNITY)
Admission: RE | Admit: 2017-08-31 | Discharge: 2017-08-31 | Disposition: A | Discharge status: Home / Self Care | Payer: Medicare Other | Source: Ambulatory Visit | Attending: Vascular Surgery | Admitting: Vascular Surgery

## 2017-08-31 ENCOUNTER — Ambulatory Visit (INDEPENDENT_AMBULATORY_CARE_PROVIDER_SITE_OTHER)
Admission: RE | Admit: 2017-08-31 | Discharge: 2017-08-31 | Disposition: A | Discharge status: Home / Self Care | Payer: Medicare Other | Source: Ambulatory Visit | Attending: Vascular Surgery | Admitting: Vascular Surgery

## 2017-08-31 ENCOUNTER — Encounter: Payer: Self-pay | Admitting: Family

## 2017-08-31 VITALS — BP 128/74 | HR 54 | Temp 97.0°F | Resp 18 | Ht 73.0 in | Wt 206.0 lb

## 2017-08-31 DIAGNOSIS — I779 Disorder of arteries and arterioles, unspecified: Secondary | ICD-10-CM

## 2017-08-31 DIAGNOSIS — I708 Atherosclerosis of other arteries: Secondary | ICD-10-CM | POA: Diagnosis not present

## 2017-08-31 DIAGNOSIS — Z9889 Other specified postprocedural states: Secondary | ICD-10-CM

## 2017-08-31 DIAGNOSIS — I10 Essential (primary) hypertension: Secondary | ICD-10-CM | POA: Diagnosis not present

## 2017-08-31 DIAGNOSIS — I739 Peripheral vascular disease, unspecified: Secondary | ICD-10-CM | POA: Diagnosis not present

## 2017-08-31 DIAGNOSIS — Z720 Tobacco use: Secondary | ICD-10-CM | POA: Diagnosis not present

## 2017-08-31 DIAGNOSIS — F17200 Nicotine dependence, unspecified, uncomplicated: Secondary | ICD-10-CM | POA: Diagnosis not present

## 2017-08-31 DIAGNOSIS — E785 Hyperlipidemia, unspecified: Secondary | ICD-10-CM | POA: Insufficient documentation

## 2017-08-31 DIAGNOSIS — I6523 Occlusion and stenosis of bilateral carotid arteries: Secondary | ICD-10-CM | POA: Insufficient documentation

## 2017-08-31 DIAGNOSIS — R0989 Other specified symptoms and signs involving the circulatory and respiratory systems: Secondary | ICD-10-CM | POA: Insufficient documentation

## 2017-08-31 DIAGNOSIS — Z95828 Presence of other vascular implants and grafts: Secondary | ICD-10-CM

## 2017-08-31 DIAGNOSIS — R938 Abnormal findings on diagnostic imaging of other specified body structures: Secondary | ICD-10-CM | POA: Diagnosis not present

## 2017-08-31 LAB — VAS US CAROTID
LEFT ECA DIAS: 12 cm/s
LEFT VERTEBRAL DIAS: -12 cm/s
LICADSYS: -84 cm/s
LICAPDIAS: 97 cm/s
LICAPSYS: 308 cm/s
Left CCA prox dias: 21 cm/s
Left CCA prox sys: 111 cm/s
Left ICA dist dias: -24 cm/s
RCCAPSYS: 106 cm/s
RIGHT CCA MID DIAS: 17 cm/s
RIGHT ECA DIAS: 9 cm/s
RIGHT VERTEBRAL DIAS: -16 cm/s
Right CCA prox dias: 18 cm/s
Right cca dist sys: -109 cm/s

## 2017-08-31 NOTE — Patient Instructions (Addendum)
Steps to Quit Smoking Smoking tobacco can be bad for your health. It can also affect almost every organ in your body. Smoking puts you and people around you at risk for many serious long-lasting (chronic) diseases. Quitting smoking is hard, but it is one of the best things that you can do for your health. It is never too late to quit. What are the benefits of quitting smoking? When you quit smoking, you lower your risk for getting serious diseases and conditions. They can include:  Lung cancer or lung disease.  Heart disease.  Stroke.  Heart attack.  Not being able to have children (infertility).  Weak bones (osteoporosis) and broken bones (fractures).  If you have coughing, wheezing, and shortness of breath, those symptoms may get better when you quit. You may also get sick less often. If you are pregnant, quitting smoking can help to lower your chances of having a baby of low birth weight. What can I do to help me quit smoking? Talk with your doctor about what can help you quit smoking. Some things you can do (strategies) include:  Quitting smoking totally, instead of slowly cutting back how much you smoke over a period of time.  Going to in-person counseling. You are more likely to quit if you go to many counseling sessions.  Using resources and support systems, such as: ? Online chats with a counselor. ? Phone quitlines. ? Printed self-help materials. ? Support groups or group counseling. ? Text messaging programs. ? Mobile phone apps or applications.  Taking medicines. Some of these medicines may have nicotine in them. If you are pregnant or breastfeeding, do not take any medicines to quit smoking unless your doctor says it is okay. Talk with your doctor about counseling or other things that can help you.  Talk with your doctor about using more than one strategy at the same time, such as taking medicines while you are also going to in-person counseling. This can help make  quitting easier. What things can I do to make it easier to quit? Quitting smoking might feel very hard at first, but there is a lot that you can do to make it easier. Take these steps:  Talk to your family and friends. Ask them to support and encourage you.  Call phone quitlines, reach out to support groups, or work with a counselor.  Ask people who smoke to not smoke around you.  Avoid places that make you want (trigger) to smoke, such as: ? Bars. ? Parties. ? Smoke-break areas at work.  Spend time with people who do not smoke.  Lower the stress in your life. Stress can make you want to smoke. Try these things to help your stress: ? Getting regular exercise. ? Deep-breathing exercises. ? Yoga. ? Meditating. ? Doing a body scan. To do this, close your eyes, focus on one area of your body at a time from head to toe, and notice which parts of your body are tense. Try to relax the muscles in those areas.  Download or buy apps on your mobile phone or tablet that can help you stick to your quit plan. There are many free apps, such as QuitGuide from the CDC (Centers for Disease Control and Prevention). You can find more support from smokefree.gov and other websites.  This information is not intended to replace advice given to you by your health care provider. Make sure you discuss any questions you have with your health care provider. Document Released: 09/17/2009 Document   Revised: 07/19/2016 Document Reviewed: 04/07/2015 Elsevier Interactive Patient Education  2018 [employer] Inc.     Stroke Prevention Some medical conditions and behaviors are associated with an increased chance of having a stroke. You may prevent a stroke by making healthy choices and managing medical conditions. How can I reduce my risk of having a stroke?  Stay physically active. Get at least 30 minutes of activity on most or all days.  Do not smoke. It may also be helpful to avoid exposure to secondhand  smoke.  Limit alcohol use. Moderate alcohol use is considered to be: ? No more than 2 drinks per day for men. ? No more than 1 drink per day for nonpregnant women.  Eat healthy foods. This involves: ? Eating 5 or more servings of fruits and vegetables a day. ? Making dietary changes that address high blood pressure (hypertension), high cholesterol, diabetes, or obesity.  Manage your cholesterol levels. ? Making food choices that are high in fiber and low in saturated fat, trans fat, and cholesterol may control cholesterol levels. ? Take any prescribed medicines to control cholesterol as directed by your health care provider.  Manage your diabetes. ? Controlling your carbohydrate and sugar intake is recommended to manage diabetes. ? Take any prescribed medicines to control diabetes as directed by your health care provider.  Control your hypertension. ? Making food choices that are low in salt (sodium), saturated fat, trans fat, and cholesterol is recommended to manage hypertension. ? Ask your health care provider if you need treatment to lower your blood pressure. Take any prescribed medicines to control hypertension as directed by your health care provider. ? If you are 18-39 years of age, have your blood pressure checked every 3-5 years. If you are 40 years of age or older, have your blood pressure checked every year.  Maintain a healthy weight. ? Reducing calorie intake and making food choices that are low in sodium, saturated fat, trans fat, and cholesterol are recommended to manage weight.  Stop drug abuse.  Avoid taking birth control pills. ? Talk to your health care provider about the risks of taking birth control pills if you are over 35 years old, smoke, get migraines, or have ever had a blood clot.  Get evaluated for sleep disorders (sleep apnea). ? Talk to your health care provider about getting a sleep evaluation if you snore a lot or have excessive sleepiness.  Take  medicines only as directed by your health care provider. ? For some people, aspirin or blood thinners (anticoagulants) are helpful in reducing the risk of forming abnormal blood clots that can lead to stroke. If you have the irregular heart rhythm of atrial fibrillation, you should be on a blood thinner unless there is a good reason you cannot take them. ? Understand all your medicine instructions.  Make sure that other conditions (such as anemia or atherosclerosis) are addressed. Get help right away if:  You have sudden weakness or numbness of the face, arm, or leg, especially on one side of the body.  Your face or eyelid droops to one side.  You have sudden confusion.  You have trouble speaking (aphasia) or understanding.  You have sudden trouble seeing in one or both eyes.  You have sudden trouble walking.  You have dizziness.  You have a loss of balance or coordination.  You have a sudden, severe headache with no known cause.  You have new chest pain or an irregular heartbeat. Any of these symptoms may represent   a serious problem that is an emergency. Do not wait to see if the symptoms will go away. Get medical help at once. Call your local emergency services (911 in [place] Do not drive yourself to the hospital. This information is not intended to replace advice given to you by your health care provider. Make sure you discuss any questions you have with your health care provider. Document Released: 12/29/2004 Document Revised: 04/28/2016 Document Reviewed: 05/24/2013 Elsevier Interactive Patient Education  2017 [place].     Peripheral Vascular Disease Peripheral vascular disease (PVD) is a disease of the blood vessels that are not part of your heart and brain. A simple term for PVD is poor circulation. In most cases, PVD narrows the blood vessels that carry blood from your heart to the rest of your body. This can result in a decreased supply of blood to your arms, legs,  and internal organs, like your stomach or kidneys. However, it most often affects a person's lower legs and feet. There are two types of PVD.  Organic PVD. This is the more common type. It is caused by damage to the structure of blood vessels.  Functional PVD. This is caused by conditions that make blood vessels contract and tighten (spasm).  Without treatment, PVD tends to get worse over time. PVD can also lead to acute ischemic limb. This is when an arm or limb suddenly has trouble getting enough blood. This is a medical emergency. Follow these instructions at home:  Take medicines only as told by your doctor.  Do not use any tobacco products, including cigarettes, chewing tobacco, or electronic cigarettes. If you need help quitting, ask your doctor.  Lose weight if you are overweight, and maintain a healthy weight as told by your doctor.  Eat a diet that is low in fat and cholesterol. If you need help, ask your doctor.  Exercise regularly. Ask your doctor for some good activities for you.  Take good care of your feet. ? Wear comfortable shoes that fit well. ? Check your feet often for any cuts or sores. Contact a doctor if:  You have cramps in your legs while walking.  You have leg pain when you are at rest.  You have coldness in a leg or foot.  Your skin changes.  You are unable to get or have an erection (erectile dysfunction).  You have cuts or sores on your feet that are not healing. Get help right away if:  Your arm or leg turns cold and blue.  Your arms or legs become red, warm, swollen, painful, or numb.  You have chest pain or trouble breathing.  You suddenly have weakness in your face, arm, or leg.  You become very confused or you cannot speak.  You suddenly have a very bad headache.  You suddenly cannot see. This information is not intended to replace advice given to you by your health care provider. Make sure you discuss any questions you have with  your health care provider. Document Released: 02/15/2010 Document Revised: 04/28/2016 Document Reviewed: 05/01/2014 Elsevier Interactive Patient Education  2017 [employer].

## 2017-08-31 NOTE — Progress Notes (Signed)
[facility] [place] HISTORY AND PHYSICAL   CC: Follow up extracranial carotid artery stenosis and peripheral artery occlusive disease   History of Present Illness:   [name] is a 71 y.o. male patient of Dr. Oneida Alar who returns today for follow up of his extracranial carotid artery stenosis and peripheral artery occlusive disease. He is s/p right SFA and external iliac stents, left SFA stent which is chronically occluded, left external iliac and left common iliac stents all placed by Dr. Albertine Patricia years ago. He reports having symptoms of claudication. He can walk so far and he gets numbness and pain in the right foot. He will stop and rest for 90 seconds and the numbness goes away and he can walk more.  Note that he has had lumbar spine surgery x2.   Pt denies non healing wounds in his feet/legs.   He states that both hips give out when he walks about 200 feet, this has remained stable per pt.  He has knee problems which are also a barrier to walking.  He plays golf, rides in a cart.   He had a left CEA in 2007 followed by left carotid stent in 2011 by Dr. Oneida Alar.   His past medical history includes TIA about 2007, states he was evaluated at [facility] at that time, denies any subsequent stroke or TIA. His medical problems include COPD, hypertension, and hypercholesterolemia; chronic kidney disease followed by Dr. Justin Mend.  His cardiologist is Dr. Burt Knack.  He has an allergy to Plavix and takes a full strength Asprin daily, a Statin, and a beta blocker.   Pt Diabetic: No Pt smoker: smoker (1 ppd, started in his late teens)  Pt meds include: Statin :Yes Betablocker: Yes ASA: Yes Other anticoagulants/antiplatelets: no   Current Outpatient Prescriptions  Medication Sig Dispense Refill  . amLODipine (NORVASC) 10 MG tablet TAKE 1 TABLET BY MOUTH EVERY DAY. 15 tablet 0  . aspirin 325 MG tablet Take 325 mg by mouth every evening.     .  carvedilol (COREG) 12.5 MG tablet TAKE 1 TABLET BY MOUTH TWICE DAILY WITH MEALS. 30 tablet 0  . losartan-hydrochlorothiazide (HYZAAR) 50-12.5 MG per tablet Take 1 tablet by mouth daily.  11  . mycophenolate (CELLCEPT) 250 MG capsule Take 250 mg by mouth 4 (four) times daily. Reported on 02/18/2016    . omeprazole (PRILOSEC) 20 MG capsule TAKE ONE CAPSULE BY MOUTH EVERY DAY AT 6AM    . rosuvastatin (CRESTOR) 10 MG tablet Take 10 mg by mouth every evening.      No current facility-administered medications for this visit.     Past Medical History:  Diagnosis Date  . CKD (chronic kidney disease), stage III   . Dyspnea on exertion   . Edema   . HTN (hypertension)   . Hyperlipidemia   . Left carotid stenosis    a. 2007 s/p L CEA;  b. 2011 s/p L common carotid stenting 2/2 restenosis;  c. 03/3153 u/s: RICA <00, LICA 86-76%, patent LCCA stent.  [name] PAD (peripheral artery disease) (HCC)    a. s/p prior RSFA, REIA, distal LSFA (known to be occluded), LEIA, and LCIA stenting;  b. 01/2015 ABI's: R 0.78, L 0.53.  [name] Stroke [facility])     Social History Social History  Substance Use Topics  . Smoking status: Current Every Day Smoker    Packs/day: 1.00    Years: 50.00    Types: Cigarettes  . Smokeless tobacco: Never Used  .  Alcohol use 4.2 oz/week    7 Glasses of wine per week    Family History Family History  Problem Relation Age of Onset  . Hypertension Mother   . Hyperlipidemia Mother     Surgical History Past Surgical History:  Procedure Laterality Date  . ABDOMINAL AORTAGRAM N/A 02/05/2015   Procedure: ABDOMINAL Maxcine Ham;  Surgeon: Conrad Lake [place], MD;  Location: [facility] CATH LAB;  Service: Cardiovascular;  Laterality: N/A;  . carotid endarterecotomy    . external iliac     status post bilateral and left common iliac artery  . lumbar back surgery     x2  . LUMBAR DISC SURGERY     x 2  . stnt     lower extermity  . TENDON REPAIR     to the left arm  . TONSILLECTOMY      Allergies   Allergen Reactions  . Clopidogrel Rash    (plavix)    Current Outpatient Prescriptions  Medication Sig Dispense Refill  . amLODipine (NORVASC) 10 MG tablet TAKE 1 TABLET BY MOUTH EVERY DAY. 15 tablet 0  . aspirin 325 MG tablet Take 325 mg by mouth every evening.     . carvedilol (COREG) 12.5 MG tablet TAKE 1 TABLET BY MOUTH TWICE DAILY WITH MEALS. 30 tablet 0  . losartan-hydrochlorothiazide (HYZAAR) 50-12.5 MG per tablet Take 1 tablet by mouth daily.  11  . mycophenolate (CELLCEPT) 250 MG capsule Take 250 mg by mouth 4 (four) times daily. Reported on 02/18/2016    . omeprazole (PRILOSEC) 20 MG capsule TAKE ONE CAPSULE BY MOUTH EVERY DAY AT 6AM    . rosuvastatin (CRESTOR) 10 MG tablet Take 10 mg by mouth every evening.      No current facility-administered medications for this visit.      REVIEW OF SYSTEMS: See HPI for pertinent positives and negatives.  Physical Examination Vitals:   08/31/17 1104 08/31/17 1106  BP: 133/67 128/74  Pulse: (!) 54   Resp: 18   Temp: (!) 97 F (36.1 C)   TempSrc: Oral   SpO2: 95%   Weight: 206 lb (93.4 kg)   Height: 6\' 1"  (1.854 m)    Body mass index is 27.18 kg/m.  General:  Normal HENT: WNL Eyes: Pupils equal Pulmonary: normal non-labored breathing, CTAB Cardiac: RRR, no murmur detected  Abdomen: soft, NT, no masses palpated Skin: no rashes, no ulcers, no cellulitis.  VASCULAR EXAM  Carotid Bruits Right Left   Negative Negative  Aorta is not palpable Radial pulses are 2+ palpable and =.   VASCULAR EXAM: Extremitieswithoutischemic changes, withoutGangrene; withoutopen wounds.  LE Pulses Right Left  FEMORAL 2+ palpable 2+ palpable   POPLITEAL not palpable  not palpable  POSTERIOR TIBIAL not palpable  not palpable   DORSALIS PEDIS ANTERIOR TIBIAL not palpable  not palpable     Musculoskeletal: no muscle wasting or atrophy; no peripheral  edema Neurologic:A&O X 3; Appropriate Affect ;  MOTOR FUNCTION: 5/5 Symmetric, CN 2-12 intact Speech is fluent/normal    ASSESSMENT:  [name] is a 71 y.o. male who is s/p left carotid endarterectomy on 06/22/2006, left common carotid artery stent on 09/10/2010, right SFA and EIA stents placed years ago, and left SFA stent placed years ago with subsequent occlusion.  He had a TIA about 2007, states he was evaluated at [facility] at that time, denies any subsequent stroke or TIA.  He may have radiculopathy sx's and claudication sx's in both LE's. He has a  hx of lumbar spine surgery x2.  Fortunately he does not have DM but unfortunately he continues to smoke a ppd.  At pt's 02/18/15 visit Dr. Oneida Alar assessment/plan mentions that if pt has further embolic events, would need to consider adding an additional antiplatelet agent. The patient has had Plavix in the past but had an allergic reaction with a rash to this. We might need to consider Brilinta if he has another atheroembolic event.    DATA Carotid duplex (08/31/17): Right ICA stenosis of 1-39%.  Left ICA (CEA site) and stent site with restenosis at 60-79%. Bilateral vertebral artery flow is antegrade.  Increased stenosis in the left ICA compared to the last exam on 02-23-17.   Bilateral Iliac Artery Stent Duplex (08/31/17): Right EIA stent wall difficult to visualize, stent appears patent with no obvious visualized plaque (velocity in the >50% range). Increased velocity in native artery distal to stent in the 75-99% stenosis range (426 cm/c). Increased velocity in the proximal segment (260 cm/s) of the left EIA stent in the 50-99% stenosis range, with increased velocity in the native artery distal to the stent in the 50-74% range (390 cm/s).  Increased velocity since prior exam on 02-23-17.   ABI (Date: 08/31/2017):  R:   ABI: 0.74 (was 0.57 on 02-23-17),   PT: mono  DP: mono  TBI:  0.59  L:   ABI: 0.70  (was 0.53),   PT: mono  DP: mono  TBI: 0.62 Bilateral ABI improved, moderate disease bilaterally, all monophasic waveforms.    02-23-17 right LE arterial duplex suggests confirmed occlusion of right SFA stent; reconstitution noted in the mid to distal SFA with monophasic flow. 301 cm/s velocity in right CFA (352 cm/s on 02-18-16).  02-05-15 Aortogram: FINDING(S):  Aorta: patent  Superior mesenteric artery: patent  Celiac artery: patent   Right Left  RA Patent Patent  CIA Patent with patent stents Patent with patent stent  EIA Patent with patent stents, distal stenosis <50%  Patent with patent stent  IIA Occluded Occluded  CFA Patent Patent  SFA Patent with patents with in-stent stenosis <50% Patent proximally with mid-segment stenosis >90%, occluded distal SFA stent  PFA Patent Patent  Pop Patent Reconstitutes proximal via SFA and profunda collaterals  Trif Patent Patent  AT Patent proximally and then occluded Patent, co-dominant runoff   Pero Patent but miniscule Patent but miniscule  PT Patent, dominant runoff Patent, co-dominant runoff      PLAN:   The patient was counseled re smoking cessation and given several free resources re smoking cessation.  Graduated walking program discussed and how to achieve.   Based on today's exam and non-invasive vascular lab results, and after discussing with Dr. Oneida Alar, the patient will follow up in 6 months with the following tests: ABI's, bilateral aortoiliac stent duplex, and carotid duplex.  I discussed in depth with the patient the nature of atherosclerosis, and emphasized the importance of maximal medical management including strict control of blood pressure, blood glucose, and lipid levels, obtaining regular exercise, and cessation of smoking.  The patient is aware that without maximal medical management the underlying atherosclerotic disease process will progress, limiting the benefit of any  interventions.  Consideration for repair of AAA would be made when the size approaches 4.8 or 5.0 cm, growth > 1 cm/yr, and symptomatic status. The patient was given information about AAA including signs, symptoms, treatment,  what symptoms should prompt the patient to seek immediate medical care, and how to minimize the risk  of enlargement and rupture of aneurysms.   The patient was given information about stroke prevention and what symptoms should prompt the patient to seek immediate medical care.  The patient was given information about PAD including signs, symptoms, treatment, what symptoms should prompt the patient to seek immediate medical care, and risk reduction measures to take.  Thank you for allowing [place] to participate in this patient's care.  Clemon Chambers, RN, MSN, FNP-C [facility] Office: [phone]  Clinic MD: [facility] 08/31/2017 11:09 AM

## 2017-09-01 NOTE — Addendum Note (Signed)
Addended by: Lianne Cure A on: 09/01/2017 10:46 AM   Modules accepted: Orders

## 2017-09-11 DIAGNOSIS — M7989 Other specified soft tissue disorders: Secondary | ICD-10-CM | POA: Diagnosis not present

## 2017-09-11 DIAGNOSIS — J449 Chronic obstructive pulmonary disease, unspecified: Secondary | ICD-10-CM | POA: Diagnosis not present

## 2017-09-11 DIAGNOSIS — F17210 Nicotine dependence, cigarettes, uncomplicated: Secondary | ICD-10-CM | POA: Diagnosis not present

## 2017-09-11 DIAGNOSIS — Z8673 Personal history of transient ischemic attack (TIA), and cerebral infarction without residual deficits: Secondary | ICD-10-CM | POA: Diagnosis not present

## 2017-09-11 DIAGNOSIS — I129 Hypertensive chronic kidney disease with stage 1 through stage 4 chronic kidney disease, or unspecified chronic kidney disease: Secondary | ICD-10-CM | POA: Diagnosis not present

## 2017-09-11 DIAGNOSIS — M1990 Unspecified osteoarthritis, unspecified site: Secondary | ICD-10-CM | POA: Diagnosis not present

## 2017-09-11 DIAGNOSIS — S99922A Unspecified injury of left foot, initial encounter: Secondary | ICD-10-CM | POA: Diagnosis not present

## 2017-09-11 DIAGNOSIS — N189 Chronic kidney disease, unspecified: Secondary | ICD-10-CM | POA: Diagnosis not present

## 2017-09-11 DIAGNOSIS — M5431 Sciatica, right side: Secondary | ICD-10-CM | POA: Diagnosis not present

## 2017-09-11 DIAGNOSIS — I739 Peripheral vascular disease, unspecified: Secondary | ICD-10-CM | POA: Diagnosis not present

## 2017-10-10 DIAGNOSIS — D631 Anemia in chronic kidney disease: Secondary | ICD-10-CM | POA: Diagnosis not present

## 2017-10-10 DIAGNOSIS — N183 Chronic kidney disease, stage 3 (moderate): Secondary | ICD-10-CM | POA: Diagnosis not present

## 2017-10-10 DIAGNOSIS — Z720 Tobacco use: Secondary | ICD-10-CM | POA: Diagnosis not present

## 2017-10-10 DIAGNOSIS — I129 Hypertensive chronic kidney disease with stage 1 through stage 4 chronic kidney disease, or unspecified chronic kidney disease: Secondary | ICD-10-CM | POA: Diagnosis not present

## 2017-10-10 DIAGNOSIS — N182 Chronic kidney disease, stage 2 (mild): Secondary | ICD-10-CM | POA: Diagnosis not present

## 2017-10-10 DIAGNOSIS — E785 Hyperlipidemia, unspecified: Secondary | ICD-10-CM | POA: Diagnosis not present

## 2017-10-10 DIAGNOSIS — N055 Unspecified nephritic syndrome with diffuse mesangiocapillary glomerulonephritis: Secondary | ICD-10-CM | POA: Diagnosis not present

## 2017-10-10 DIAGNOSIS — Z6828 Body mass index (BMI) 28.0-28.9, adult: Secondary | ICD-10-CM | POA: Diagnosis not present

## 2017-10-19 DIAGNOSIS — H5203 Hypermetropia, bilateral: Secondary | ICD-10-CM | POA: Diagnosis not present

## 2017-10-19 DIAGNOSIS — H524 Presbyopia: Secondary | ICD-10-CM | POA: Diagnosis not present

## 2017-10-19 DIAGNOSIS — H25813 Combined forms of age-related cataract, bilateral: Secondary | ICD-10-CM | POA: Diagnosis not present

## 2017-10-19 DIAGNOSIS — H52223 Regular astigmatism, bilateral: Secondary | ICD-10-CM | POA: Diagnosis not present

## 2017-11-03 DIAGNOSIS — N183 Chronic kidney disease, stage 3 (moderate): Secondary | ICD-10-CM | POA: Diagnosis not present

## 2017-11-03 DIAGNOSIS — I739 Peripheral vascular disease, unspecified: Secondary | ICD-10-CM | POA: Diagnosis not present

## 2017-11-03 DIAGNOSIS — I779 Disorder of arteries and arterioles, unspecified: Secondary | ICD-10-CM | POA: Diagnosis not present

## 2017-11-03 DIAGNOSIS — J439 Emphysema, unspecified: Secondary | ICD-10-CM | POA: Diagnosis not present

## 2017-11-03 DIAGNOSIS — Z23 Encounter for immunization: Secondary | ICD-10-CM | POA: Diagnosis not present

## 2017-11-03 DIAGNOSIS — I10 Essential (primary) hypertension: Secondary | ICD-10-CM | POA: Diagnosis not present

## 2017-11-10 DIAGNOSIS — J439 Emphysema, unspecified: Secondary | ICD-10-CM | POA: Diagnosis not present

## 2018-03-12 ENCOUNTER — Ambulatory Visit (INDEPENDENT_AMBULATORY_CARE_PROVIDER_SITE_OTHER)
Admission: RE | Admit: 2018-03-12 | Discharge: 2018-03-12 | Disposition: A | Discharge status: Home / Self Care | Payer: Medicare Other | Source: Ambulatory Visit | Attending: Family | Admitting: Family

## 2018-03-12 ENCOUNTER — Encounter: Payer: Self-pay | Admitting: Family

## 2018-03-12 ENCOUNTER — Ambulatory Visit (HOSPITAL_COMMUNITY)
Admission: RE | Admit: 2018-03-12 | Discharge: 2018-03-12 | Disposition: A | Discharge status: Home / Self Care | Payer: Medicare Other | Source: Ambulatory Visit | Attending: Family | Admitting: Family

## 2018-03-12 ENCOUNTER — Ambulatory Visit (INDEPENDENT_AMBULATORY_CARE_PROVIDER_SITE_OTHER): Payer: Medicare Other | Admitting: Family

## 2018-03-12 VITALS — BP 146/75 | HR 50 | Temp 97.2°F | Resp 17 | Ht 74.0 in | Wt 206.0 lb

## 2018-03-12 DIAGNOSIS — I6523 Occlusion and stenosis of bilateral carotid arteries: Secondary | ICD-10-CM | POA: Insufficient documentation

## 2018-03-12 DIAGNOSIS — I779 Disorder of arteries and arterioles, unspecified: Secondary | ICD-10-CM

## 2018-03-12 DIAGNOSIS — Z95828 Presence of other vascular implants and grafts: Secondary | ICD-10-CM

## 2018-03-12 DIAGNOSIS — F17200 Nicotine dependence, unspecified, uncomplicated: Secondary | ICD-10-CM

## 2018-03-12 DIAGNOSIS — Z9889 Other specified postprocedural states: Secondary | ICD-10-CM | POA: Diagnosis not present

## 2018-03-12 DIAGNOSIS — I708 Atherosclerosis of other arteries: Secondary | ICD-10-CM | POA: Diagnosis not present

## 2018-03-12 DIAGNOSIS — I70201 Unspecified atherosclerosis of native arteries of extremities, right leg: Secondary | ICD-10-CM | POA: Insufficient documentation

## 2018-03-12 NOTE — Progress Notes (Signed)
[facility] [place] HISTORY AND PHYSICAL   CC: Follow up extracranial carotid artery stenosis and peripheral artery occlusive disease    History of Present Illness:   [name] is a 72 y.o. male whom Dr. Oneida Alar who has been monitoring for extracranial carotid artery stenosis and peripheral artery occlusive disease.  He is s/p right SFA and external iliac stents, left SFA stent which is chronically occluded, left external iliac and left common iliac stents all placed by Dr. Albertine Patricia years ago.  After walking about 150 feet, right hip bothers him. He states he has not been evaluated for arthritis in his hips.  Note that he has had lumbar spine surgery x2.   Pt denies non healing wounds in his feet/legs.  He has knee problems which are also a barrier to walking.  He plays golf, rides in a cart.   He had a left CEA in 2007 followed by left carotid stent in 2011 by Dr. Oneida Alar.   His past medical history includes TIA about 2007, states he was evaluated at [facility] at that time, denies any subsequent stroke or TIA. His medical problems include COPD, hypertension, and hypercholesterolemia; chronic kidney disease followed by Dr. Justin Mend.  His cardiologist is Dr. Burt Knack. [name]   Pt Diabetic: No Pt smoker: smoker (1 ppd, started in his late teens)  Pt meds include: Statin :Yes Betablocker: Yes ASA: Yes, 325 mg Other anticoagulants/antiplatelets: no, tried Plavix, had hives    Current Outpatient Medications  Medication Sig Dispense Refill  . amLODipine (NORVASC) 10 MG tablet TAKE 1 TABLET BY MOUTH EVERY DAY. 15 tablet 0  . aspirin 325 MG tablet Take 325 mg by mouth every evening.     . carvedilol (COREG) 12.5 MG tablet TAKE 1 TABLET BY MOUTH TWICE DAILY WITH MEALS. 30 tablet 0  . losartan-hydrochlorothiazide (HYZAAR) 50-12.5 MG per tablet Take 1 tablet by mouth daily.  11  . omeprazole (PRILOSEC) 20 MG capsule TAKE ONE CAPSULE BY MOUTH EVERY DAY AT 6AM     . rosuvastatin (CRESTOR) 10 MG tablet Take 10 mg by mouth every evening.      No current facility-administered medications for this visit.     Past Medical History:  Diagnosis Date  . CKD (chronic kidney disease), stage III ([place])   . Dyspnea on exertion   . Edema   . HTN (hypertension)   . Hyperlipidemia   . Left carotid stenosis    a. 2007 s/p L CEA;  b. 2011 s/p L common carotid stenting 2/2 restenosis;  c. 03/972 u/s: RICA <53, LICA 29-92%, patent LCCA stent.  [name] PAD (peripheral artery disease) (HCC)    a. s/p prior RSFA, REIA, distal LSFA (known to be occluded), LEIA, and LCIA stenting;  b. 01/2015 ABI's: R 0.78, L 0.53.  [name] Stroke [facility])     Social History Social History   Tobacco Use  . Smoking status: Current Every Day Smoker    Packs/day: 1.00    Years: 50.00    Pack years: 50.00    Types: Cigarettes  . Smokeless tobacco: Never Used  Substance Use Topics  . Alcohol use: Yes    Alcohol/week: 4.2 oz    Types: 7 Glasses of wine per week  . Drug use: No    Family History Family History  Problem Relation Age of Onset  . Hypertension Mother   . Hyperlipidemia Mother     Surgical History Past Surgical History:  Procedure Laterality Date  .  ABDOMINAL AORTAGRAM N/A 02/05/2015   Procedure: ABDOMINAL Maxcine Ham;  Surgeon: Conrad East York, MD;  Location: [facility] CATH LAB;  Service: Cardiovascular;  Laterality: N/A;  . carotid endarterecotomy    . external iliac     status post bilateral and left common iliac artery  . lumbar back surgery     x2  . LUMBAR DISC SURGERY     x 2  . stnt     lower extermity  . TENDON REPAIR     to the left arm  . TONSILLECTOMY      Allergies  Allergen Reactions  . Clopidogrel Rash    (plavix)    Current Outpatient Medications  Medication Sig Dispense Refill  . amLODipine (NORVASC) 10 MG tablet TAKE 1 TABLET BY MOUTH EVERY DAY. 15 tablet 0  . aspirin 325 MG tablet Take 325 mg by mouth every evening.     . carvedilol (COREG) 12.5 MG  tablet TAKE 1 TABLET BY MOUTH TWICE DAILY WITH MEALS. 30 tablet 0  . losartan-hydrochlorothiazide (HYZAAR) 50-12.5 MG per tablet Take 1 tablet by mouth daily.  11  . omeprazole (PRILOSEC) 20 MG capsule TAKE ONE CAPSULE BY MOUTH EVERY DAY AT 6AM    . rosuvastatin (CRESTOR) 10 MG tablet Take 10 mg by mouth every evening.      No current facility-administered medications for this visit.      REVIEW OF SYSTEMS: See HPI for pertinent positives and negatives.  Physical Examination Vitals:   03/12/18 1026 03/12/18 1028  BP: (!) 149/73 (!) 146/75  Pulse: (!) 50   Resp: 17   Temp: (!) 97.2 F (36.2 C)   TempSrc: Oral   SpO2: 95%   Weight: 206 lb (93.4 kg)   Height: 6\' 2"  (1.88 m)    Body mass index is 26.45 kg/m.  General: WDWN male HENT: No gross abnormalities   Eyes: PERRLA Pulmonary: normal non-labored breathing, CTAB Cardiac: RRR, no murmur detected Abdomen: soft, NT, no masses palpated Skin: no rashes, no ulcers, no cellulitis.  VASCULAR EXAM  Carotid Bruits Right Left   Negative Negative   Abdominal aortic pulse is not palpable Radial pulses are 2+ palpable and =.   VASCULAR EXAM: Extremitieswithoutischemic changes, withoutGangrene; withoutopen wounds.  LE Pulses Right Left  FEMORAL 2+ palpable 2+ palpable   POPLITEAL not palpable  not palpable  POSTERIOR TIBIAL not palpable  not palpable   DORSALIS PEDIS ANTERIOR TIBIAL notpalpable  faintly palpable     Musculoskeletal: no muscle wasting or atrophy; no peripheral edema Neurologic:  A&O X 3; appropriate affect, sensation is normal; speech is normal, CN 2-12 intact, pain and light touch intact in extremities, motor exam as listed above. Psychiatric: Normal thought content, mood appropriate to clinical situation.    ASSESSMENT:  [name] is a 72 y.o. male who is s/p left carotid endarterectomy on 06/22/2006, left common  carotid artery stent on 09/10/2010, right SFA and EIA stents placed years ago, and left SFA stent placed years ago with subsequent occlusion.   He had a TIA about 2007, states he was evaluated at [facility] at that time, denies any subsequent stroke or TIA.  He may have radiculopathy sx's and claudication sx's in both LE's. He has a hx of lumbar spine surgery x2.  Fortunately he does not have DM but unfortunately he continues to smoke a ppd.  At pt's 02/18/15 visit Dr. Oneida Alar assessment/plan mentions that if pt has further embolic events, would need to consider adding an additional  antiplatelet agent. The patient has had Plavix in the past but had an allergic reaction with a rash to this. We might need to consider Brilinta if he has another atheroembolic event.  Degree of stenosis in bilateral iliac arteries is no worse, is actually better compared to previous velocities, bilateral femoral pulses are palpable, right 1+, left 2+.  Right hip pain is worsening, known lumbar spine issues, no known OA issues in hips.     DATA  Carotid Duplex (03/12/18): Right ICA: 1-39% stenosis Patent left CCA stent with velocity in the left proximal ICA at 60-79% stenosis. Bilateral vertebral artery flow is antegrade.  Bilateral subclavian artery waveforms are normal.  No significant change compared to the exam on 08-31-17.   Bilateral Iliac Artery Stent Duplex (03/12/18): Increased velocity in the right stent, distal segment and outflow artery (352 cm/s) in the 50-99% range. Known occluded right CFA.  Dampened left CFA suggestive of more proximal disease.    Bilateral Iliac Artery Stent Duplex (08/31/17): Right EIA stent wall difficult to visualize, stent appears patent with no obvious visualized plaque (velocity in the >50% range). Increased velocity in native artery distal to stent in the 75-99% stenosis range (426 cm/c). Increased velocity in the proximal segment (260 cm/s) of the left EIA stent  in the 50-99% stenosis range, with increased velocity in the native artery distal to the stent in the 50-74% range (390 cm/s).  Increased velocity since prior exam on 02-23-17.   ABI (Date: 03/12/2018):  R:   ABI: 0.67 (was 0.74 on 08-31-17),   PT: mono  DP: mono  TBI:  0.17 (was 0.59)  L:   ABI: 0.62 (was 0.70),   PT: mono  DP: mono  TBI: 0.62 (was 0.62)  Mild decline in bilateral ABI, all waveforms remain monophasic. Decline in    02-05-15 Aortogram: FINDING(S):  Aorta: patent  Superior mesenteric artery: patent  Celiac artery: patent   Right Left  RA Patent Patent  CIA Patent with patent stents Patent with patent stent  EIA Patent with patent stents, distal stenosis <50%  Patent with patent stent  IIA Occluded Occluded  CFA Patent Patent  SFA Patent with patents with in-stent stenosis <50% Patent proximally with mid-segment stenosis >90%, occluded distal SFA stent  PFA Patent Patent  Pop Patent Reconstitutes proximal via SFA and profunda collaterals  Trif Patent Patent  AT Patent proximally and then occluded Patent, co-dominant runoff   Pero Patent but miniscule Patent but miniscule  PT Patent, dominant runoff Patent, co-dominant runoff      PLAN:   The patient was counseled re smoking cessation and given several free resources re smoking cessation.  Graduated walking program discussed and how to achieve.   Based on today's exam and non-invasive vascular lab results, the patient will follow up in 6 monthswith the following tests: ABI's, bilateral aortoiliac stent duplex, and carotid duplex. I advised him to notify us if he develops concerns re the circulation in his feet or legs.    I discussed in depth with the patient the nature of atherosclerosis, and emphasized the importance of maximal medical management including strict control of blood pressure, blood glucose, and lipid levels, obtaining regular exercise, and cessation of smoking.  The  patient is aware that without maximal medical management the underlying atherosclerotic disease process will progress, limiting the benefit of any interventions.  The patient was given information about stroke prevention and what symptoms should prompt the patient to seek immediate medical care.  The  patient was given information about PAD including signs, symptoms, treatment, what symptoms should prompt the patient to seek immediate medical care, and risk reduction measures to take.  Thank you for allowing [place] to participate in this patient's care.  Clemon Chambers, RN, MSN, FNP-C [facility] Office: [phone]  Clinic MD: Trula Slade 03/12/2018 10:32 AM

## 2018-03-12 NOTE — Patient Instructions (Signed)
Steps to Quit Smoking Smoking tobacco can be bad for your health. It can also affect almost every organ in your body. Smoking puts you and people around you at risk for many serious long-lasting (chronic) diseases. Quitting smoking is hard, but it is one of the best things that you can do for your health. It is never too late to quit. What are the benefits of quitting smoking? When you quit smoking, you lower your risk for getting serious diseases and conditions. They can include:  Lung cancer or lung disease.  Heart disease.  Stroke.  Heart attack.  Not being able to have children (infertility).  Weak bones (osteoporosis) and broken bones (fractures).  If you have coughing, wheezing, and shortness of breath, those symptoms may get better when you quit. You may also get sick less often. If you are pregnant, quitting smoking can help to lower your chances of having a baby of low birth weight. What can I do to help me quit smoking? Talk with your doctor about what can help you quit smoking. Some things you can do (strategies) include:  Quitting smoking totally, instead of slowly cutting back how much you smoke over a period of time.  Going to in-person counseling. You are more likely to quit if you go to many counseling sessions.  Using resources and support systems, such as: ? Online chats with a counselor. ? Phone quitlines. ? Printed self-help materials. ? Support groups or group counseling. ? Text messaging programs. ? Mobile phone apps or applications.  Taking medicines. Some of these medicines may have nicotine in them. If you are pregnant or breastfeeding, do not take any medicines to quit smoking unless your doctor says it is okay. Talk with your doctor about counseling or other things that can help you.  Talk with your doctor about using more than one strategy at the same time, such as taking medicines while you are also going to in-person counseling. This can help make  quitting easier. What things can I do to make it easier to quit? Quitting smoking might feel very hard at first, but there is a lot that you can do to make it easier. Take these steps:  Talk to your family and friends. Ask them to support and encourage you.  Call phone quitlines, reach out to support groups, or work with a counselor.  Ask people who smoke to not smoke around you.  Avoid places that make you want (trigger) to smoke, such as: ? Bars. ? Parties. ? Smoke-break areas at work.  Spend time with people who do not smoke.  Lower the stress in your life. Stress can make you want to smoke. Try these things to help your stress: ? Getting regular exercise. ? Deep-breathing exercises. ? Yoga. ? Meditating. ? Doing a body scan. To do this, close your eyes, focus on one area of your body at a time from head to toe, and notice which parts of your body are tense. Try to relax the muscles in those areas.  Download or buy apps on your mobile phone or tablet that can help you stick to your quit plan. There are many free apps, such as QuitGuide from the CDC (Centers for Disease Control and Prevention). You can find more support from smokefree.gov and other websites.  This information is not intended to replace advice given to you by your health care provider. Make sure you discuss any questions you have with your health care provider. Document Released: 09/17/2009 Document   Revised: 07/19/2016 Document Reviewed: 04/07/2015 Elsevier Interactive Patient Education  2018 Elsevier Inc.   Stroke Prevention Some health problems and behaviors may make it more likely for you to have a stroke. Below are ways to lessen your risk of having a stroke.  Be active for at least 30 minutes on most or all days.  Do not smoke. Try not to be around others who smoke.  Do not drink too much alcohol. ? Do not have more than 2 drinks a day if you are a man. ? Do not have more than 1 drink a day if you are a  woman and are not pregnant.  Eat healthy foods, such as fruits and vegetables. If you were put on a specific diet, follow the diet as told.  Keep your cholesterol levels under control through diet and medicines. Look for foods that are low in saturated fat, trans fat, cholesterol, and are high in fiber.  If you have diabetes, follow all diet plans and take your medicine as told.  Ask your doctor if you need treatment to lower your blood pressure. If you have high blood pressure (hypertension), follow all diet plans and take your medicine as told by your doctor.  If you are 18-39 years old, have your blood pressure checked every 3-5 years. If you are age 40 or older, have your blood pressure checked every year.  Keep a healthy weight. Eat foods that are low in calories, salt, saturated fat, trans fat, and cholesterol.  Do not take drugs.  Avoid birth control pills, if this applies. Talk to your doctor about the risks of taking birth control pills.  Talk to your doctor if you have sleep problems (sleep apnea).  Take all medicine as told by your doctor. ? You may be told to take aspirin or blood thinner medicine. Take this medicine as told by your doctor. ? Understand your medicine instructions.  Make sure any other conditions you have are being taken care of.  Get help right away if:  You suddenly lose feeling (you feel numb) or have weakness in your face, arm, or leg.  Your face or eyelid hangs down to one side.  You suddenly feel confused.  You have trouble talking (aphasia) or understanding what people are saying.  You suddenly have trouble seeing in one or both eyes.  You suddenly have trouble walking.  You are dizzy.  You lose your balance or your movements are clumsy (uncoordinated).  You suddenly have a very bad headache and you do not know the cause.  You have new chest pain.  Your heart feels like it is fluttering or skipping a beat (irregular heartbeat). Do  not wait to see if the symptoms above go away. Get help right away. Call your local emergency services (911 in [place] Do not drive yourself to the hospital. This information is not intended to replace advice given to you by your health care provider. Make sure you discuss any questions you have with your health care provider. Document Released: 05/22/2012 Document Revised: 04/28/2016 Document Reviewed: 05/24/2013 Elsevier Interactive Patient Education  2018 Elsevier Inc.     Peripheral Vascular Disease Peripheral vascular disease (PVD) is a disease of the blood vessels that are not part of your heart and brain. A simple term for PVD is poor circulation. In most cases, PVD narrows the blood vessels that carry blood from your heart to the rest of your body. This can result in a decreased supply of blood to   your arms, legs, and internal organs, like your stomach or kidneys. However, it most often affects a person's lower legs and feet. There are two types of PVD.  Organic PVD. This is the more common type. It is caused by damage to the structure of blood vessels.  Functional PVD. This is caused by conditions that make blood vessels contract and tighten (spasm).  Without treatment, PVD tends to get worse over time. PVD can also lead to acute ischemic limb. This is when an arm or limb suddenly has trouble getting enough blood. This is a medical emergency. Follow these instructions at home:  Take medicines only as told by your doctor.  Do not use any tobacco products, including cigarettes, chewing tobacco, or electronic cigarettes. If you need help quitting, ask your doctor.  Lose weight if you are overweight, and maintain a healthy weight as told by your doctor.  Eat a diet that is low in fat and cholesterol. If you need help, ask your doctor.  Exercise regularly. Ask your doctor for some good activities for you.  Take good care of your feet. ? Wear comfortable shoes that fit  well. ? Check your feet often for any cuts or sores. Contact a doctor if:  You have cramps in your legs while walking.  You have leg pain when you are at rest.  You have coldness in a leg or foot.  Your skin changes.  You are unable to get or have an erection (erectile dysfunction).  You have cuts or sores on your feet that are not healing. Get help right away if:  Your arm or leg turns cold and blue.  Your arms or legs become red, warm, swollen, painful, or numb.  You have chest pain or trouble breathing.  You suddenly have weakness in your face, arm, or leg.  You become very confused or you cannot speak.  You suddenly have a very bad headache.  You suddenly cannot see. This information is not intended to replace advice given to you by your health care provider. Make sure you discuss any questions you have with your health care provider. Document Released: 02/15/2010 Document Revised: 04/28/2016 Document Reviewed: 05/01/2014 Elsevier Interactive Patient Education  2017 Elsevier Inc.  

## 2018-04-16 ENCOUNTER — Other Ambulatory Visit: Payer: Self-pay

## 2018-04-16 DIAGNOSIS — I6523 Occlusion and stenosis of bilateral carotid arteries: Secondary | ICD-10-CM

## 2018-04-16 DIAGNOSIS — Z95828 Presence of other vascular implants and grafts: Secondary | ICD-10-CM

## 2018-04-16 DIAGNOSIS — I779 Disorder of arteries and arterioles, unspecified: Secondary | ICD-10-CM

## 2018-09-10 ENCOUNTER — Ambulatory Visit (INDEPENDENT_AMBULATORY_CARE_PROVIDER_SITE_OTHER): Payer: Medicare Other | Admitting: Family

## 2018-09-10 ENCOUNTER — Ambulatory Visit (INDEPENDENT_AMBULATORY_CARE_PROVIDER_SITE_OTHER)
Admission: RE | Admit: 2018-09-10 | Discharge: 2018-09-10 | Disposition: A | Discharge status: Home / Self Care | Payer: Medicare Other | Source: Ambulatory Visit | Attending: Family | Admitting: Family

## 2018-09-10 ENCOUNTER — Encounter: Payer: Self-pay | Admitting: Family

## 2018-09-10 ENCOUNTER — Other Ambulatory Visit: Payer: Self-pay

## 2018-09-10 ENCOUNTER — Ambulatory Visit (HOSPITAL_COMMUNITY)
Admission: RE | Admit: 2018-09-10 | Discharge: 2018-09-10 | Disposition: A | Discharge status: Home / Self Care | Payer: Medicare Other | Source: Ambulatory Visit | Attending: Family | Admitting: Family

## 2018-09-10 VITALS — BP 130/73 | HR 65 | Temp 97.1°F | Resp 18 | Ht 74.0 in | Wt 202.0 lb

## 2018-09-10 DIAGNOSIS — I6523 Occlusion and stenosis of bilateral carotid arteries: Secondary | ICD-10-CM

## 2018-09-10 DIAGNOSIS — Z9889 Other specified postprocedural states: Secondary | ICD-10-CM

## 2018-09-10 DIAGNOSIS — I779 Disorder of arteries and arterioles, unspecified: Secondary | ICD-10-CM

## 2018-09-10 DIAGNOSIS — Z95828 Presence of other vascular implants and grafts: Secondary | ICD-10-CM | POA: Insufficient documentation

## 2018-09-10 DIAGNOSIS — F17200 Nicotine dependence, unspecified, uncomplicated: Secondary | ICD-10-CM

## 2018-09-10 NOTE — Progress Notes (Addendum)
[facility] [place] HISTORY AND PHYSICAL   CC: Follow up extracranial carotid artery stenosis and peripheral artery occlusive disease     History of Present Illness:   [name] is a 72 y.o. male whom Dr. Oneida Alar who has been monitoring for extracranial carotid artery stenosis and peripheral artery occlusive disease.  He is s/p right SFA and external iliac stents, left SFA stent which is chronically occluded, left external iliac and left common iliac stents all placed by Dr. Albertine Patricia years ago.  After walking about 150 feet, right hip bothers him. He states he has not been evaluated for arthritis in his hips.  Note that he has had lumbar spine surgery x3, and that his right foot feels numb at times.   Pt denies non healing wounds in his feet/legs.  He has bilateral knee problems which are also a barrier to walking; he had a left knee replacement about 2009 or 2011.   He plays golf, rides in a cart.   He had a left CEA in 2007 followed by left carotid stent in 2011 by Dr. Oneida Alar.   His past medical history includes TIA about 2007 that affected his speech only, no lateralizing sx's, no vision changes, states he was evaluated at [facility] at that time, denies any subsequent stroke or TIA. His medical problems include COPD, hypertension, and hypercholesterolemia; chronic kidney disease followed by Dr. Justin Mend. Last serum creatinine result on file was 1.65 on 08-08-15, GFR was 41, stage 3b CKD.  His cardiologist is Dr. Burt Knack.    Diabetic: No Tobacco use: smoker (1 ppd, started in his late teens)  Pt meds include: Statin :Yes Betablocker: Yes ASA: Yes, 325 mg Other anticoagulants/antiplatelets: no, tried Plavix, had hives    Current Outpatient Medications  Medication Sig Dispense Refill  . amLODipine (NORVASC) 10 MG tablet TAKE 1 TABLET BY MOUTH EVERY DAY. 15 tablet 0  . aspirin 325 MG tablet Take 325 mg by mouth every evening.     . carvedilol  (COREG) 12.5 MG tablet TAKE 1 TABLET BY MOUTH TWICE DAILY WITH MEALS. 30 tablet 0  . losartan-hydrochlorothiazide (HYZAAR) 50-12.5 MG per tablet Take 1 tablet by mouth daily.  11  . omeprazole (PRILOSEC) 20 MG capsule TAKE ONE CAPSULE BY MOUTH EVERY DAY AT 6AM    . rosuvastatin (CRESTOR) 10 MG tablet Take 10 mg by mouth every evening.      No current facility-administered medications for this visit.     Past Medical History:  Diagnosis Date  . CKD (chronic kidney disease), stage III ([place])   . Dyspnea on exertion   . Edema   . HTN (hypertension)   . Hyperlipidemia   . Left carotid stenosis    a. 2007 s/p L CEA;  b. 2011 s/p L common carotid stenting 2/2 restenosis;  c. 12/4780 u/s: RICA <95, LICA 62-13%, patent LCCA stent.  [name] PAD (peripheral artery disease) (HCC)    a. s/p prior RSFA, REIA, distal LSFA (known to be occluded), LEIA, and LCIA stenting;  b. 01/2015 ABI's: R 0.78, L 0.53.  [name] Stroke [facility])     Social History Social History   Tobacco Use  . Smoking status: Current Every Day Smoker    Packs/day: 1.00    Years: 50.00    Pack years: 50.00    Types: Cigarettes  . Smokeless tobacco: Never Used  Substance Use Topics  . Alcohol use: Yes    Alcohol/week: 7.0 standard drinks  Types: 7 Glasses of wine per week  . Drug use: No    Family History Family History  Problem Relation Age of Onset  . Hypertension Mother   . Hyperlipidemia Mother     Surgical History Past Surgical History:  Procedure Laterality Date  . ABDOMINAL AORTAGRAM N/A 02/05/2015   Procedure: ABDOMINAL Maxcine Ham;  Surgeon: Conrad Genoa, MD;  Location: [facility] CATH LAB;  Service: Cardiovascular;  Laterality: N/A;  . carotid endarterecotomy    . external iliac     status post bilateral and left common iliac artery  . lumbar back surgery     x2  . LUMBAR DISC SURGERY     x 2  . stnt     lower extermity  . TENDON REPAIR     to the left arm  . TONSILLECTOMY      Allergies  Allergen Reactions  .  Clopidogrel Rash    (plavix)    Current Outpatient Medications  Medication Sig Dispense Refill  . amLODipine (NORVASC) 10 MG tablet TAKE 1 TABLET BY MOUTH EVERY DAY. 15 tablet 0  . aspirin 325 MG tablet Take 325 mg by mouth every evening.     . carvedilol (COREG) 12.5 MG tablet TAKE 1 TABLET BY MOUTH TWICE DAILY WITH MEALS. 30 tablet 0  . losartan-hydrochlorothiazide (HYZAAR) 50-12.5 MG per tablet Take 1 tablet by mouth daily.  11  . omeprazole (PRILOSEC) 20 MG capsule TAKE ONE CAPSULE BY MOUTH EVERY DAY AT 6AM    . rosuvastatin (CRESTOR) 10 MG tablet Take 10 mg by mouth every evening.      No current facility-administered medications for this visit.      REVIEW OF SYSTEMS: See HPI for pertinent positives and negatives.  Physical Examination Vitals:   09/10/18 1040 09/10/18 1048 09/10/18 1050  BP: (!) 143/74 127/70 130/73  Pulse: 65 65 65  Resp: 18    Temp: (!) 97.1 F (36.2 C)    TempSrc: Oral    SpO2: 95%    Weight: 202 lb (91.6 kg)    Height: 6\' 2"  (1.88 m)     Body mass index is 25.94 kg/m.  General:  WDWN male HENT: No gross abnormalities   Eyes: PERRLA Pulmonary: normal non-labored breathing, CTAB Cardiac: RRR, no murmur detected Abdomen: soft, NT, no masses palpated Skin: no rashes, no ulcers, no cellulitis.  VASCULAR EXAM  Carotid Bruits Right Left   Negative Negative   Abdominal aortic pulse is not palpable Radial pulses are 2+ palpable and =.   VASCULAR EXAM: Extremitieswithoutischemic changes, withoutGangrene; withoutopen wounds.  LE Pulses Right Left  FEMORAL 2+ palpable 2+ palpable   POPLITEAL not palpable  not palpable  POSTERIOR TIBIAL not palpable  not palpable   DORSALIS PEDIS ANTERIOR TIBIAL notpalpable  faintly palpable     Musculoskeletal: no muscle wasting or atrophy; no peripheral edema Neurologic:  A&O X 3; appropriate affect, sensation is normal;  speech is normal, CN 2-12 intact, pain and light touch intact in extremities, motor exam as listed above. Psychiatric: Normal thought content, mood appropriate to clinical situation.    ASSESSMENT:  [name] is a 72 y.o. male who is s/p left carotid endarterectomy on 06/22/2006, left common carotid artery stent on 09/10/2010, right SFA and EIA stents placed years ago, and left SFA stent placed years ago with subsequent occlusion.   He had a TIA about 2007, states he was evaluated at [facility] at that time, denies any subsequent stroke or TIA.  He may have radiculopathy sx's and claudication sx's in both LE's. He has a hx of lumbar spine surgery x3.  Fortunately he does not have DM but unfortunately he continues to smoke a ppd.  At pt's 02/18/15 visit Dr. Oneida Alar assessment/plan mentions that if pt has further embolic events, would need to consider adding an additional antiplatelet agent. The patient has had Plavix in the past but had an allergic reaction with a rash to this. We might need to consider Brilinta if he has another atheroembolic event.   DATA  Carotid Duplex(09-10-18): Right ICA: 1-39% stenosis Left ICA: 60-79% stenosis. CCA stent with no significant stenosis. Bilateral vertebral artery flow is antegrade.  Bilateral subclavian artery waveforms are normal.  No significant change compared to the exam on 08-31-17 and 03-12-18.  Bilateral Iliac Artery Stent Duplex (09-10-18): Decreased stenosis in the right EIA stents compared to the exam on 03-12-18, highest velocity is 253 cm/s at the proximal stent (compared to 352 cm/s at the distal segment and outflow artery. Further improved compared to 08-31-17 native artery distal to stent was at 426 cm/c. All monophasic waveforms. Known occluded right CFA.    Left: Highest velocity is 304 cm/s at inflow to stent, which is improved, compared to 08-31-17 highest velocity at native artery distal to the stent was 390 cm/s. All biphasic  waveforms.    ABI (Date: 09/10/2018):  R:   ABI: 0.59 (was 0.67 on 03-12-18),   PT: mono (was mono)  DP: not audible (was mono)  TBI:  0.37, toe pressure 53, (was 0.17)  L:   ABI: 0.50 (was 0.62),   PT: mono (was mono)  DP: mono (was mono)  TBI: 0.38, toe pressure 55, (was 0.62) Decline in bilateral ABI with moderate disease bilaterally.  Right waveforms: absent DP (was mono), monophasic PT. Left waveforms remain monophasic.  Improved right TBI, decline in left TBI.    02-05-15 Aortogram: FINDING(S):  Aorta: patent  Superior mesenteric artery: patent  Celiac artery: patent   Right Left  RA Patent Patent  CIA Patent with patent stents Patent with patent stent  EIA Patent with patent stents, distal stenosis <50%  Patent with patent stent  IIA Occluded Occluded  CFA Patent Patent  SFA Patent with patents with in-stent stenosis <50% Patent proximally with mid-segment stenosis >90%, occluded distal SFA stent  PFA Patent Patent  Pop Patent Reconstitutes proximal via SFA and profunda collaterals  Trif Patent Patent  AT Patent proximally and then occluded Patent, co-dominant runoff   Pero Patent but miniscule Patent but miniscule  PT Patent, dominant runoff Patent, co-dominant runoff      PLAN:   The patient was again counseled re smoking cessation and given several free resources re smoking cessation.  Graduated walking program discussed and how to achieve: walk at least 30 minutes daily in total, in a safe environment.  Daily seated leg exercises to supplement walking, demonstrated and discussed.   Based on today's exam and non-invasive vascular lab results, the patient will follow up in 6 monthswith the following tests: ABI's and carotid duplex. I advised him to notify us if he develops concerns re the circulation in his feet or legs.    I discussed in depth with the patient the nature of atherosclerosis, and emphasized the importance of maximal  medical management including strict control of blood pressure, blood glucose, and lipid levels, obtaining regular exercise, and cessation of smoking.  The patient is aware that without maximal medical management the underlying atherosclerotic  disease process will progress, limiting the benefit of any interventions, customized to this patient.   The patient was given information about stroke prevention and what symptoms should prompt the patient to seek immediate medical care.  The patient was given information about PAD including signs, symptoms, treatment, what symptoms should prompt the patient to seek immediate medical care, and risk reduction measures to take.  Thank you for allowing [place] to participate in this patient's care.  Clemon Chambers, RN, MSN, FNP-C [facility] Office: [phone]  Clinic MD: Trula Slade 09/10/2018 10:54 AM

## 2018-09-10 NOTE — Patient Instructions (Addendum)
Before your next abdominal ultrasound:  Avoid gas forming foods and beverages the day before the test.   Take two Extra-Strength Gas-X capsules at bedtime the night before the test. Take another two Extra-Strength Gas-X capsules in the middle of the night if you get up to the restroom, if not, first thing in the morning with water.  Do not chew gum.     Steps to Quit Smoking Smoking tobacco can be bad for your health. It can also affect almost every organ in your body. Smoking puts you and people around you at risk for many serious long-lasting (chronic) diseases. Quitting smoking is hard, but it is one of the best things that you can do for your health. It is never too late to quit. What are the benefits of quitting smoking? When you quit smoking, you lower your risk for getting serious diseases and conditions. They can include:  Lung cancer or lung disease.  Heart disease.  Stroke.  Heart attack.  Not being able to have children (infertility).  Weak bones (osteoporosis) and broken bones (fractures).  If you have coughing, wheezing, and shortness of breath, those symptoms may get better when you quit. You may also get sick less often. If you are pregnant, quitting smoking can help to lower your chances of having a baby of low birth weight. What can I do to help me quit smoking? Talk with your doctor about what can help you quit smoking. Some things you can do (strategies) include:  Quitting smoking totally, instead of slowly cutting back how much you smoke over a period of time.  Going to in-person counseling. You are more likely to quit if you go to many counseling sessions.  Using resources and support systems, such as: ? Online chats with a counselor. ? Phone quitlines. ? Printed self-help materials. ? Support groups or group counseling. ? Text messaging programs. ? Mobile phone apps or applications.  Taking medicines. Some of these medicines may have nicotine in them.  If you are pregnant or breastfeeding, do not take any medicines to quit smoking unless your doctor says it is okay. Talk with your doctor about counseling or other things that can help you.  Talk with your doctor about using more than one strategy at the same time, such as taking medicines while you are also going to in-person counseling. This can help make quitting easier. What things can I do to make it easier to quit? Quitting smoking might feel very hard at first, but there is a lot that you can do to make it easier. Take these steps:  Talk to your family and friends. Ask them to support and encourage you.  Call phone quitlines, reach out to support groups, or work with a counselor.  Ask people who smoke to not smoke around you.  Avoid places that make you want (trigger) to smoke, such as: ? Bars. ? Parties. ? Smoke-break areas at work.  Spend time with people who do not smoke.  Lower the stress in your life. Stress can make you want to smoke. Try these things to help your stress: ? Getting regular exercise. ? Deep-breathing exercises. ? Yoga. ? Meditating. ? Doing a body scan. To do this, close your eyes, focus on one area of your body at a time from head to toe, and notice which parts of your body are tense. Try to relax the muscles in those areas.  Download or buy apps on your mobile phone or tablet that can   help you stick to your quit plan. There are many free apps, such as QuitGuide from the [employer] Office manager for Disease Control and Prevention). You can find more support from smokefree.gov and other websites.  This information is not intended to replace advice given to you by your health care provider. Make sure you discuss any questions you have with your health care provider. Document Released: 09/17/2009 Document Revised: 07/19/2016 Document Reviewed: 04/07/2015 Elsevier Interactive Patient Education  2018 [employer].     Stroke Prevention Some health problems and  behaviors may make it more likely for you to have a stroke. Below are ways to lessen your risk of having a stroke.  Be active for at least 30 minutes on most or all days.  Do not smoke. Try not to be around others who smoke.  Do not drink too much alcohol. ? Do not have more than 2 drinks a day if you are a man. ? Do not have more than 1 drink a day if you are a woman and are not pregnant.  Eat healthy foods, such as fruits and vegetables. If you were put on a specific diet, follow the diet as told.  Keep your cholesterol levels under control through diet and medicines. Look for foods that are low in saturated fat, trans fat, cholesterol, and are high in fiber.  If you have diabetes, follow all diet plans and take your medicine as told.  Ask your doctor if you need treatment to lower your blood pressure. If you have high blood pressure (hypertension), follow all diet plans and take your medicine as told by your doctor.  If you are 56-45 years old, have your blood pressure checked every 3-5 years. If you are age 107 or older, have your blood pressure checked every year.  Keep a healthy weight. Eat foods that are low in calories, salt, saturated fat, trans fat, and cholesterol.  Do not take drugs.  Avoid birth control pills, if this applies. Talk to your doctor about the risks of taking birth control pills.  Talk to your doctor if you have sleep problems (sleep apnea).  Take all medicine as told by your doctor. ? You may be told to take aspirin or blood thinner medicine. Take this medicine as told by your doctor. ? Understand your medicine instructions.  Make sure any other conditions you have are being taken care of.  Get help right away if:  You suddenly lose feeling (you feel numb) or have weakness in your face, arm, or leg.  Your face or eyelid hangs down to one side.  You suddenly feel confused.  You have trouble talking (aphasia) or understanding what people are  saying.  You suddenly have trouble seeing in one or both eyes.  You suddenly have trouble walking.  You are dizzy.  You lose your balance or your movements are clumsy (uncoordinated).  You suddenly have a very bad headache and you do not know the cause.  You have new chest pain.  Your heart feels like it is fluttering or skipping a beat (irregular heartbeat). Do not wait to see if the symptoms above go away. Get help right away. Call your local emergency services (911 in [place] Do not drive yourself to the hospital. This information is not intended to replace advice given to you by your health care provider. Make sure you discuss any questions you have with your health care provider. Document Released: 05/22/2012 Document Revised: 04/28/2016 Document Reviewed: 05/24/2013 Elsevier Interactive Patient  Education  2018 [employer].     Peripheral Vascular Disease Peripheral vascular disease (PVD) is a disease of the blood vessels that are not part of your heart and brain. A simple term for PVD is poor circulation. In most cases, PVD narrows the blood vessels that carry blood from your heart to the rest of your body. This can result in a decreased supply of blood to your arms, legs, and internal organs, like your stomach or kidneys. However, it most often affects a person's lower legs and feet. There are two types of PVD.  Organic PVD. This is the more common type. It is caused by damage to the structure of blood vessels.  Functional PVD. This is caused by conditions that make blood vessels contract and tighten (spasm).  Without treatment, PVD tends to get worse over time. PVD can also lead to acute ischemic limb. This is when an arm or limb suddenly has trouble getting enough blood. This is a medical emergency. Follow these instructions at home:  Take medicines only as told by your doctor.  Do not use any tobacco products, including cigarettes, chewing tobacco, or electronic  cigarettes. If you need help quitting, ask your doctor.  Lose weight if you are overweight, and maintain a healthy weight as told by your doctor.  Eat a diet that is low in fat and cholesterol. If you need help, ask your doctor.  Exercise regularly. Ask your doctor for some good activities for you.  Take good care of your feet. ? Wear comfortable shoes that fit well. ? Check your feet often for any cuts or sores. Contact a doctor if:  You have cramps in your legs while walking.  You have leg pain when you are at rest.  You have coldness in a leg or foot.  Your skin changes.  You are unable to get or have an erection (erectile dysfunction).  You have cuts or sores on your feet that are not healing. Get help right away if:  Your arm or leg turns cold and blue.  Your arms or legs become red, warm, swollen, painful, or numb.  You have chest pain or trouble breathing.  You suddenly have weakness in your face, arm, or leg.  You become very confused or you cannot speak.  You suddenly have a very bad headache.  You suddenly cannot see. This information is not intended to replace advice given to you by your health care provider. Make sure you discuss any questions you have with your health care provider. Document Released: 02/15/2010 Document Revised: 04/28/2016 Document Reviewed: 05/01/2014 Elsevier Interactive Patient Education  2017 [employer].

## 2019-03-07 ENCOUNTER — Ambulatory Visit: Payer: Medicare Other | Admitting: Family

## 2019-03-07 ENCOUNTER — Encounter (HOSPITAL_COMMUNITY): Payer: Medicare Other

## 2019-06-18 ENCOUNTER — Other Ambulatory Visit: Payer: Self-pay

## 2019-06-18 DIAGNOSIS — I6523 Occlusion and stenosis of bilateral carotid arteries: Secondary | ICD-10-CM

## 2019-06-18 DIAGNOSIS — Z95828 Presence of other vascular implants and grafts: Secondary | ICD-10-CM

## 2019-06-18 DIAGNOSIS — I779 Disorder of arteries and arterioles, unspecified: Secondary | ICD-10-CM

## 2019-06-26 ENCOUNTER — Telehealth (HOSPITAL_COMMUNITY): Payer: Self-pay

## 2019-06-26 NOTE — Telephone Encounter (Signed)

## 2019-06-27 ENCOUNTER — Ambulatory Visit (HOSPITAL_COMMUNITY)
Admission: RE | Admit: 2019-06-27 | Discharge: 2019-06-27 | Disposition: A | Discharge status: Home / Self Care | Payer: Medicare Other | Source: Ambulatory Visit | Attending: Family | Admitting: Family

## 2019-06-27 ENCOUNTER — Other Ambulatory Visit: Payer: Self-pay

## 2019-06-27 ENCOUNTER — Ambulatory Visit (INDEPENDENT_AMBULATORY_CARE_PROVIDER_SITE_OTHER): Payer: Medicare Other | Admitting: Family

## 2019-06-27 ENCOUNTER — Encounter: Payer: Self-pay | Admitting: Family

## 2019-06-27 ENCOUNTER — Ambulatory Visit (INDEPENDENT_AMBULATORY_CARE_PROVIDER_SITE_OTHER)
Admission: RE | Admit: 2019-06-27 | Discharge: 2019-06-27 | Disposition: A | Discharge status: Home / Self Care | Payer: Medicare Other | Source: Ambulatory Visit | Attending: Family | Admitting: Family

## 2019-06-27 VITALS — BP 138/91 | HR 81 | Temp 97.9°F | Resp 20 | Ht 74.0 in | Wt 205.0 lb

## 2019-06-27 DIAGNOSIS — Z9889 Other specified postprocedural states: Secondary | ICD-10-CM

## 2019-06-27 DIAGNOSIS — F17200 Nicotine dependence, unspecified, uncomplicated: Secondary | ICD-10-CM

## 2019-06-27 DIAGNOSIS — Z95828 Presence of other vascular implants and grafts: Secondary | ICD-10-CM | POA: Insufficient documentation

## 2019-06-27 DIAGNOSIS — I6523 Occlusion and stenosis of bilateral carotid arteries: Secondary | ICD-10-CM | POA: Diagnosis present

## 2019-06-27 DIAGNOSIS — I779 Disorder of arteries and arterioles, unspecified: Secondary | ICD-10-CM

## 2019-06-27 NOTE — Progress Notes (Signed)
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 male whomDr. 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  He states that he had a stroke in about 2010 as manifested by garbled speech and lips feeling numb, he denies any residual neurological deficits, denies any subsequent neurological events.    He is s/p right SFA and external iliac stents, left SFA stent which is chronically occluded, left external iliac and left common iliac stents all placed by Dr. Albertine Patricia years ago in [place].  After walking about 100 feet, both hips and legs bother him, right worse than left. He states he has not been evaluated for arthritis in his hips. Note that he has had lumbar spine surgery x3, and that his right foot feels numb at times when he stands for long periods.    Pt denies non healing wounds in his feet/legs.  He has bilateral knee problems which are also a barrier to walking; he had a left knee replacement about 2009 or 2011.   He plays golf, rides in a cart.   He had a left CEA in 2007 followed by left carotid stent in 2011 by Dr. Oneida Alar.   His past medical history includes TIA about 2007 that affected his speech only, no lateralizing sx's, no vision changes, states he was evaluated at [facility] at that time, denies any subsequent stroke or TIA. His medical problems include COPD, hypertension, and hypercholesterolemia; chronic kidney disease followed by Dr. Justin Mend. Last serum creatinine result on file was 1.65 on 08-08-15, GFR was 41, stage 3b CKD.  His cardiologist is Dr. Burt Knack.    Diabetic: No Tobacco use: smoker (1 ppd, started in his late teens)  Pt meds include: Statin :Yes Betablocker: Yes ASA: Yes, 325 mg Other  anticoagulants/antiplatelets: no, tried Plavix, had hives   Current Outpatient Medications  Medication Sig Dispense Refill  . amLODipine (NORVASC) 10 MG tablet TAKE 1 TABLET BY MOUTH EVERY DAY. 15 tablet 0  . aspirin 325 MG tablet Take 325 mg by mouth every evening.     . carvedilol (COREG) 12.5 MG tablet TAKE 1 TABLET BY MOUTH TWICE DAILY WITH MEALS. 30 tablet 0  . losartan-hydrochlorothiazide (HYZAAR) 50-12.5 MG per tablet Take 1 tablet by mouth daily.  11  . omeprazole (PRILOSEC) 20 MG capsule TAKE ONE CAPSULE BY MOUTH EVERY DAY AT 6AM    . rosuvastatin (CRESTOR) 10 MG tablet Take 10 mg by mouth every evening.      No current facility-administered medications for this visit.     Past Medical History:  Diagnosis Date  . CKD (chronic kidney disease), stage III ([place])   . Dyspnea on exertion   . Edema   . HTN (hypertension)   . Hyperlipidemia   . Left carotid stenosis    a. 2007 s/p L CEA;  b. 2011 s/p L common carotid stenting 2/2 restenosis;  c. 12/8839 u/s: RICA <66, LICA 06-30%, patent LCCA stent.  [name] PAD (peripheral artery disease) (HCC)    a. s/p prior RSFA, REIA, distal LSFA (known to be occluded), LEIA, and LCIA stenting;  b. 01/2015 ABI's: R 0.78, L 0.53.  [name] Stroke [facility])     Social History Social History   Tobacco Use  . Smoking status: Current Every Day Smoker    Packs/day:  1.00    Years: 50.00    Pack years: 50.00    Types: Cigarettes  . Smokeless tobacco: Never Used  Substance Use Topics  . Alcohol use: Yes    Alcohol/week: 7.0 standard drinks    Types: 7 Glasses of wine per week  . Drug use: No    Family History Family History  Problem Relation Age of Onset  . Hypertension Mother   . Hyperlipidemia Mother     Surgical History Past Surgical History:  Procedure Laterality Date  . ABDOMINAL AORTAGRAM N/A 02/05/2015   Procedure: ABDOMINAL Maxcine Ham;  Surgeon: Conrad Riverside, MD;  Location: [facility] CATH LAB;  Service: Cardiovascular;  Laterality: N/A;  . carotid  endarterecotomy    . external iliac     status post bilateral and left common iliac artery  . lumbar back surgery     x2  . LUMBAR DISC SURGERY     x 2  . stnt     lower extermity  . TENDON REPAIR     to the left arm  . TONSILLECTOMY      Allergies  Allergen Reactions  . Clopidogrel Rash    (plavix)    Current Outpatient Medications  Medication Sig Dispense Refill  . amLODipine (NORVASC) 10 MG tablet TAKE 1 TABLET BY MOUTH EVERY DAY. 15 tablet 0  . aspirin 325 MG tablet Take 325 mg by mouth every evening.     . carvedilol (COREG) 12.5 MG tablet TAKE 1 TABLET BY MOUTH TWICE DAILY WITH MEALS. 30 tablet 0  . losartan-hydrochlorothiazide (HYZAAR) 50-12.5 MG per tablet Take 1 tablet by mouth daily.  11  . omeprazole (PRILOSEC) 20 MG capsule TAKE ONE CAPSULE BY MOUTH EVERY DAY AT 6AM    . rosuvastatin (CRESTOR) 10 MG tablet Take 10 mg by mouth every evening.      No current facility-administered medications for this visit.      REVIEW OF SYSTEMS: See HPI for pertinent positives and negatives.  Physical Examination Vitals:   06/27/19 0939 06/27/19 0943  BP: (!) 139/91 (!) 138/91  Pulse: 81   Resp: 20   Temp: 97.9 F (36.6 C)   SpO2: 93%   Weight: 205 lb (93 kg)   Height: 6\' 2"  (1.88 m)    Body mass index is 26.32 kg/m.  General:  WDWN in NAD Gait: Normal HENT: no gross abnormalities  Eyes: Pupils equal Pulmonary: normal non-labored breathing, limited air movement in all fields, moderate wheezing in all fields, no rales or rhonchi. Cardiac: RRR, no murmur detected, distant heart sounds Abdomen: soft, NT, no masses palpated Skin: no rashes, no ulcers, no cellulitis. Tan  VASCULAR EXAM Carotid Bruits Right Left   Negative Negative      Radial pulses are palpable bilaterally   Adominal aortic pulse is not palpable                      VASCULAR EXAM: Extremities without ischemic changes, without Gangrene; without open wounds.  LE Pulses Right Left       FEMORAL  2+ palpable  2+ palpable        POPLITEAL  not palpable   not palpable       POSTERIOR TIBIAL  not palpable   not palpable        DORSALIS PEDIS      ANTERIOR TIBIAL not palpable  not palpable    Musculoskeletal: no muscle wasting or atrophy; no peripheral edema  Neurologic:  A&O X 3; appropriate affect, sensation is normal; speech is normal, CN 2-12 intact, pain and light touch intact in extremities, motor exam as listed above. Psychiatric: Normal thought content, mood appropriate to clinical situation.     DATA  Carotid Duplex (06-27-19): Right Carotid Findings: +----------+--------+--------+--------+----------------------+--------+           PSV cm/sEDV cm/sStenosisDescribe              Comments +----------+--------+--------+--------+----------------------+--------+ CCA Prox  66      18                                             +----------+--------+--------+--------+----------------------+--------+ CCA Mid   58      17                                             +----------+--------+--------+--------+----------------------+--------+ CCA Distal54      17                                             +----------+--------+--------+--------+----------------------+--------+ ICA Prox  71      20      1-39%   calcific and irregular         +----------+--------+--------+--------+----------------------+--------+ ICA Mid   62      25                                             +----------+--------+--------+--------+----------------------+--------+ ICA Distal80      29                                             +----------+--------+--------+--------+----------------------+--------+ ECA       105     12                                              +----------+--------+--------+--------+----------------------+--------+  +----------+--------+-------+----------------+-------------------+           PSV cm/sEDV cmsDescribe        Arm Pressure (mmHG) +----------+--------+-------+----------------+-------------------+ [id number]             Multiphasic, WNL                    +----------+--------+-------+----------------+-------------------+  +---------+--------+--+--------+--+---------+ VertebralPSV cm/s47EDV cm/s16Antegrade +---------+--------+--+--------+--+---------+    Left Carotid Findings: +----------+-------+--------+--------+------------------------+----------------+  PSV    EDV cm/sStenosisDescribe                Comments                   cm/s                                                            +----------+-------+--------+--------+------------------------+----------------+ CCA Prox  51     17                                                       +----------+-------+--------+--------+------------------------+----------------+ CCA Mid   102    21                                                       +----------+-------+--------+--------+------------------------+----------------+ CCA Distal78     12                                                       +----------+-------+--------+--------+------------------------+----------------+ ICA Prox  265    113     80-99%  homogeneous and         low end of range                                  hypoechoic                               +----------+-------+--------+--------+------------------------+----------------+ ICA Mid   185    39                                                       +----------+-------+--------+--------+------------------------+----------------+ ICA Distal124    39                                                        +----------+-------+--------+--------+------------------------+----------------+ ECA       67     9                                                        +----------+-------+--------+--------+------------------------+----------------+  +----------+--------+--------+----------------+-------------------+ SubclavianPSV cm/sEDV cm/sDescribe        Arm Pressure (mmHG) +----------+--------+--------+----------------+-------------------+  60              Multiphasic, WNL                    +----------+--------+--------+----------------+-------------------+  +---------+--------+-+--------+--------------+ VertebralPSV cm/s0EDV cm/sNot identified +---------+--------+-+--------+--------------+    Left Stent(s): +---------------+--------+--------+--------+--------+--------+ common carotid PSV cm/sEDV cm/sStenosisWaveformComments +---------------+--------+--------+--------+--------+--------+ Prox to Stent  51                                       +---------------+--------+--------+--------+--------+--------+ Proximal Stent 48                                       +---------------+--------+--------+--------+--------+--------+ Mid Stent      93                                       +---------------+--------+--------+--------+--------+--------+ Distal Stent   67                                       +---------------+--------+--------+--------+--------+--------+ Distal to Stent265                                      +---------------+--------+--------+--------+--------+--------+  Summary: Right Carotid: Velocities in the right ICA are consistent with a 1-39% stenosis.  Left Carotid: Velocities in the left ICA are consistent with a 80-99% stenosis.       Non-hemodynamically significant plaque noted in the CCA. Vertebrals:  Right vertebral artery demonstrates antegrade flow. Left vertebral artery was not  visualized. Subclavians: Normal flow hemodynamics were seen in bilateral subclavian arteries. Stable in the right, increased stenosis in the left ICA compared to the exam on 09-10-18.    Bilateral Iliac Artery Stent Duplex (06-27-19): Abdominal Aorta Findings: +-------------+-------+----------+----------+----------+--------+--------+ Location     AP (cm)Trans (cm)PSV (cm/s)Waveform  ThrombusComments +-------------+-------+----------+----------+----------+--------+--------+ Proximal     1.87   1.94      155                                  +-------------+-------+----------+----------+----------+--------+--------+ Distal                        88                                   +-------------+-------+----------+----------+----------+--------+--------+ RT EIA Prox                   129       triphasic                  +-------------+-------+----------+----------+----------+--------+--------+ RT EIA Mid                    209       monophasic                 +-------------+-------+----------+----------+----------+--------+--------+ RT EIA Distal  245       monophasic                 +-------------+-------+----------+----------+----------+--------+--------+ LT EIA Prox                   167       monophasic                 +-------------+-------+----------+----------+----------+--------+--------+ LT EIA Mid                    117       biphasic                   +-------------+-------+----------+----------+----------+--------+--------+ LT EIA Distal                 117       biphasic                   +-------------+-------+----------+----------+----------+--------+--------+  External iliac arteries are stented bilaterally.   Right Stent(s): +-------------------+--------+---------------+----------+--------+ common iliac arteryPSV cm/sStenosis       Waveform   Comments +-------------------+--------+---------------+----------+--------+ Prox to Stent      88                     monophasic         +-------------------+--------+---------------+----------+--------+ Proximal Stent     90                     biphasic           +-------------------+--------+---------------+----------+--------+ Mid Stent          65                     biphasic           +-------------------+--------+---------------+----------+--------+ Distal Stent       109                    biphasic           +-------------------+--------+---------------+----------+--------+ Distal to Stent    223     50-99% stenosismonophasic         +-------------------+--------+---------------+----------+--------+ Left Stent(s): +-------------------+--------+--------+----------+--------+ common iliac arteryPSV cm/sStenosisWaveform  Comments +-------------------+--------+--------+----------+--------+ Prox to Stent      183             biphasic           +-------------------+--------+--------+----------+--------+ Proximal Stent     151             triphasic          +-------------------+--------+--------+----------+--------+ Mid Stent          152             monophasic         +-------------------+--------+--------+----------+--------+ Distal Stent       86              monophasic         +-------------------+--------+--------+----------+--------+ Distal to Stent    109             biphasic           +-------------------+--------+--------+----------+--------+  Summary: Abdominal Aorta: No evidence of an abdominal aortic aneurysm was visualized. Stenosis: +--------------------+---------------+ Location            Stent           +--------------------+---------------+ Right Common Iliac  50-99% stenosis +--------------------+---------------+ Right External Iliac50-99%  stenosis +--------------------+---------------+ No  significant change compared to the exam on 09-10-18.   ABI (Date: 06/27/2019): ABI Findings: +---------+------------------+-----+----------+--------+ Right    Rt Pressure (mmHg)IndexWaveform  Comment  +---------+------------------+-----+----------+--------+ Brachial 136                                       +---------+------------------+-----+----------+--------+ ATA      77                0.57 monophasic         +---------+------------------+-----+----------+--------+ PTA      68                0.50 monophasic         +---------+------------------+-----+----------+--------+ Great Toe62                0.46                    +---------+------------------+-----+----------+--------+  +---------+------------------+-----+--------+-------+ Left     Lt Pressure (mmHg)IndexWaveformComment +---------+------------------+-----+--------+-------+ Brachial 133                                    +---------+------------------+-----+--------+-------+ ATA      97                0.71 biphasic        +---------+------------------+-----+--------+-------+ PTA      74                0.54 biphasic        +---------+------------------+-----+--------+-------+ Great Toe74                0.54                 +---------+------------------+-----+--------+-------+  +-------+-----------+-----------+------------+------------+ ABI/TBIToday's ABIToday's TBIPrevious ABIPrevious TBI +-------+-----------+-----------+------------+------------+ Right  0.57       0.46       0.59        0.37         +-------+-----------+-----------+------------+------------+ Left   0.71       0.54       0.50        0.38         +-------+-----------+-----------+------------+------------+  Right ABIs appear essentially unchanged compared to prior study on 09/10/2018. Left ABIs appear increased compared to  prior study on 09/10/2018.   Summary: Right: Resting right ankle-brachial index indicates moderate right lower extremity arterial disease. The right toe-brachial index is abnormal. RT great toe pressure = 62 mmHg.  Left: Resting left ankle-brachial index indicates moderate left lower extremity arterial disease. The left toe-brachial index is abnormal. LT Great toe pressure = 74 mmHg.   ASSESSMENT:  [name] is a 73 y.o. male who is s/p left carotid endarterectomy on 06/22/2006, left common carotid artery stent on 09/10/2010, right SFA and EIA stents placed years ago, and left SFA stent placed years ago with subsequent occlusion.   He had a TIA about 2007, states he was evaluated at [facility] at that time, denies any subsequent stroke or TIA.  He may have radiculopathy sx's and claudication sx's in both LE's. He has a hx of lumbar spine surgery x3.  Fortunately he does not have DM but unfortunately he continues to smoke a ppd.  At pt's 02/18/15 visit Dr. Oneida Alar assessment/plan mentions that if pt has further embolic events, would need to consider adding  an additional antiplatelet agent. The patient has had Plavix in the past but had an allergic reaction with a rash to this. We might need to consider Brilinta if he has another atheroembolic event.    PLAN:   Based on today's exam and non-invasive vascular lab results, and after discussing with Dr. Scot Dock, the patient will follow up with Dr. Oneida Alar at his soonest availability to discuss asymptomatic 3rd recurrence of left CEA stenosis at 80-99%.   Over 3 minutes was spent counseling patient re smoking cessation, and patient was given several free resources re smoking cessation.  I discussed in depth with the patient the nature of atherosclerosis, and emphasized the importance of maximal medical management including strict control of blood pressure, blood glucose, and lipid levels, obtaining regular exercise, and cessation of smoking.   The patient is aware that without maximal medical management the underlying atherosclerotic disease process will progress, limiting the benefit of any interventions.  The patient was given information about stroke prevention and what symptoms should prompt the patient to seek immediate medical care.  The patient was given information about PAD including signs, symptoms, treatment, what symptoms should prompt the patient to seek immediate medical care, and risk reduction measures to take.  Thank you for allowing [place] to participate in this patient's care.  Clemon Chambers, RN, MSN, FNP-C [facility] Office: [phone]  Clinic MD: Scot Dock 06/27/2019 10:26 AM

## 2019-06-27 NOTE — Patient Instructions (Addendum)
Steps to Quit Smoking Smoking tobacco is the leading cause of preventable death. It can affect almost every organ in the body. Smoking puts you and people around you at risk for many serious, long-lasting (chronic) diseases. Quitting smoking can be hard, but it is one of the best things that you can do for your health. It is never too late to quit. How do I get ready to quit? When you decide to quit smoking, make a plan to help you succeed. Before you quit:  Pick a date to quit. Set a date within the next 2 weeks to give you time to prepare.  Write down the reasons why you are quitting. Keep this list in places where you will see it often.  Tell your family, friends, and co-workers that you are quitting. Their support is important.  Talk with your doctor about the choices that may help you quit.  Find out if your health insurance will pay for these treatments.  Know the people, places, things, and activities that make you want to smoke (triggers). Avoid them. What first steps can I take to quit smoking?  Throw away all cigarettes at home, at work, and in your car.  Throw away the things that you use when you smoke, such as ashtrays and lighters.  Clean your car. Make sure to empty the ashtray.  Clean your home, including curtains and carpets. What can I do to help me quit smoking? Talk with your doctor about taking medicines and seeing a counselor at the same time. You are more likely to succeed when you do both.  If you are pregnant or breastfeeding, talk with your doctor about counseling or other ways to quit smoking. Do not take medicine to help you quit smoking unless your doctor tells you to do so. To quit smoking: Quit right away  Quit smoking totally, instead of slowly cutting back on how much you smoke over a period of time.  Go to counseling. You are more likely to quit if you go to counseling sessions regularly. Take medicine You may take medicines to help you quit. Some  medicines need a prescription, and some you can buy over-the-counter. Some medicines may contain a drug called nicotine to replace the nicotine in cigarettes. Medicines may:  Help you to stop having the desire to smoke (cravings).  Help to stop the problems that come when you stop smoking (withdrawal symptoms). Your doctor may ask you to use:  Nicotine patches, gum, or lozenges.  Nicotine inhalers or sprays.  Non-nicotine medicine that is taken by mouth. Find resources Find resources and other ways to help you quit smoking and remain smoke-free after you quit. These resources are most helpful when you use them often. They include:  Online chats with a counselor.  Phone quitlines.  Printed self-help materials.  Support groups or group counseling.  Text messaging programs.  Mobile phone apps. Use apps on your mobile phone or tablet that can help you stick to your quit plan. There are many free apps for mobile phones and tablets as well as websites. Examples include Quit Guide from the CDC and smokefree.gov  What things can I do to make it easier to quit?   Talk to your family and friends. Ask them to support and encourage you.  Call a phone quitline (1-800-QUIT-NOW), reach out to support groups, or work with a counselor.  Ask people who smoke to not smoke around you.  Avoid places that make you want to smoke,   such as: ? Bars. ? Parties. ? Smoke-break areas at work.  Spend time with people who do not smoke.  Lower the stress in your life. Stress can make you want to smoke. Try these things to help your stress: ? Getting regular exercise. ? Doing deep-breathing exercises. ? Doing yoga. ? Meditating. ? Doing a body scan. To do this, close your eyes, focus on one area of your body at a time from head to toe. Notice which parts of your body are tense. Try to relax the muscles in those areas. How will I feel when I quit smoking? Day 1 to 3 weeks Within the first 24 hours,  you may start to have some problems that come from quitting tobacco. These problems are very bad 2-3 days after you quit, but they do not often last for more than 2-3 weeks. You may get these symptoms:  Mood swings.  Feeling restless, nervous, angry, or annoyed.  Trouble concentrating.  Dizziness.  Strong desire for high-sugar foods and nicotine.  Weight gain.  Trouble pooping (constipation).  Feeling like you may vomit (nausea).  Coughing or a sore throat.  Changes in how the medicines that you take for other issues work in your body.  Depression.  Trouble sleeping (insomnia). Week 3 and afterward After the first 2-3 weeks of quitting, you may start to notice more positive results, such as:  Better sense of smell and taste.  Less coughing and sore throat.  Slower heart rate.  Lower blood pressure.  Clearer skin.  Better breathing.  Fewer sick days. Quitting smoking can be hard. Do not give up if you fail the first time. Some people need to try a few times before they succeed. Do your best to stick to your quit plan, and talk with your doctor if you have any questions or concerns. Summary  Smoking tobacco is the leading cause of preventable death. Quitting smoking can be hard, but it is one of the best things that you can do for your health.  When you decide to quit smoking, make a plan to help you succeed.  Quit smoking right away, not slowly over a period of time.  When you start quitting, seek help from your doctor, family, or friends. This information is not intended to replace advice given to you by your health care provider. Make sure you discuss any questions you have with your health care provider. Document Released: 09/17/2009 Document Revised: 02/08/2019 Document Reviewed: 02/09/2019 Elsevier Patient Education  2020 [employer]     Stroke Prevention Some medical conditions and lifestyle choices can lead to a higher risk for a stroke. You can  help to prevent a stroke by making nutrition, lifestyle, and other changes. What nutrition changes can be made?   Eat healthy foods. ? Choose foods that are high in fiber. These include:  Fresh fruits.  Fresh vegetables.  Whole grains. ? Eat at least 5 or more servings of fruits and vegetables each day. Try to fill half of your plate at each meal with fruits and vegetables. ? Choose lean protein foods. These include:  Lowfat (lean) cuts of meat.  Chicken without skin.  Fish.  Tofu.  Beans.  Nuts. ? Eat low-fat dairy products. ? Avoid foods that:  Are high in salt (sodium).  Have saturated fat.  Have trans fat.  Have cholesterol.  Are processed.  Are premade.  Follow eating guidelines as told by your doctor. These may include: ? Reducing how many calories you   eat and drink each day. ? Limiting how much salt you eat or drink each day to 1,500 milligrams (mg). ? Using only healthy fats for cooking. These include:  Olive oil.  Canola oil.  Sunflower oil. ? Counting how many carbohydrates you eat and drink each day. What lifestyle changes can be made?  Try to stay at a healthy weight. Talk to your doctor about what a good weight is for you.  Get at least 30 minutes of moderate physical activity at least 5 days a week. This can include: ? Fast walking. ? Biking. ? Swimming.  Do not use any products that have nicotine or tobacco. This includes cigarettes and e-cigarettes. If you need help quitting, ask your doctor. Avoid being around tobacco smoke in general.  Limit how much alcohol you drink to no more than 1 drink a day for nonpregnant women and 2 drinks a day for men. One drink equals 12 oz of beer, 5 oz of wine, or 1 oz of hard liquor.  Do not use drugs.  Avoid taking birth control pills. Talk to your doctor about the risks of taking birth control pills if: ? You are over 35 years old. ? You smoke. ? You get migraines. ? You have had a blood clot.  What other changes can be made?  Manage your cholesterol. ? It is important to eat a healthy diet. ? If your cholesterol cannot be managed through your diet, you may also need to take medicines. Take medicines as told by your doctor.  Manage your diabetes. ? It is important to eat a healthy diet and to exercise regularly. ? If your blood sugar cannot be managed through diet and exercise, you may need to take medicines. Take medicines as told by your doctor.  Control your high blood pressure (hypertension). ? Try to keep your blood pressure below 130/80. This can help lower your risk of stroke. ? It is important to eat a healthy diet and to exercise regularly. ? If your blood pressure cannot be managed through diet and exercise, you may need to take medicines. Take medicines as told by your doctor. ? Ask your doctor if you should check your blood pressure at home. ? Have your blood pressure checked every year. Do this even if your blood pressure is normal.  Talk to your doctor about getting checked for a sleep disorder. Signs of this can include: ? Snoring a lot. ? Feeling very tired.  Take over-the-counter and prescription medicines only as told by your doctor. These may include aspirin or blood thinners (antiplatelets or anticoagulants).  Make sure that any other medical conditions you have are managed. Where to find more information  American Stroke Association: www.strokeassociation.org  National Stroke Association: www.stroke.org Get help right away if:  You have any symptoms of stroke. "BE FAST" is an easy way to remember the main warning signs: ? B - Balance. Signs are dizziness, sudden trouble walking, or loss of balance. ? E - Eyes. Signs are trouble seeing or a sudden change in how you see. ? F - Face. Signs are sudden weakness or loss of feeling of the face, or the face or eyelid drooping on one side. ? A - Arms. Signs are weakness or loss of feeling in an arm. This  happens suddenly and usually on one side of the body. ? S - Speech. Signs are sudden trouble speaking, slurred speech, or trouble understanding what people say. ? T - Time. Time to call emergency   services. Write down what time symptoms started.  You have other signs of stroke, such as: ? A sudden, very bad headache with no known cause. ? Feeling sick to your stomach (nausea). ? Throwing up (vomiting). ? Jerky movements you cannot control (seizure). These symptoms may represent a serious problem that is an emergency. Do not wait to see if the symptoms will go away. Get medical help right away. Call your local emergency services (911 in the [place] Do not drive yourself to the hospital. Summary  You can prevent a stroke by eating healthy, exercising, not smoking, drinking less alcohol, and treating other health problems, such as diabetes, high blood pressure, or high cholesterol.  Do not use any products that contain nicotine or tobacco, such as cigarettes and e-cigarettes.  Get help right away if you have any signs or symptoms of a stroke. This information is not intended to replace advice given to you by your health care provider. Make sure you discuss any questions you have with your health care provider. Document Released: 05/22/2012 Document Revised: 01/17/2019 Document Reviewed: 02/22/2017 Elsevier Patient Education  2020 [place].     Peripheral Vascular Disease  Peripheral vascular disease (PVD) is a disease of the blood vessels that are not part of your heart and brain. A simple term for PVD is poor circulation. In most cases, PVD narrows the blood vessels that carry blood from your heart to the rest of your body. This can reduce the supply of blood to your arms, legs, and internal organs, like your stomach or kidneys. However, PVD most often affects a person's lower legs and feet. Without treatment, PVD tends to get worse. PVD can also lead to acute ischemic limb. This is when  an arm or leg suddenly cannot get enough blood. This is a medical emergency. Follow these instructions at home: Lifestyle  Do not use any products that contain nicotine or tobacco, such as cigarettes and e-cigarettes. If you need help quitting, ask your doctor.  Lose weight if you are overweight. Or, stay at a healthy weight as told by your doctor.  Eat a diet that is low in fat and cholesterol. If you need help, ask your doctor.  Exercise regularly. Ask your doctor for activities that are right for you. General instructions  Take over-the-counter and prescription medicines only as told by your doctor.  Take good care of your feet: ? Wear comfortable shoes that fit well. ? Check your feet often for any cuts or sores.  Keep all follow-up visits as told by your doctor This is important. Contact a doctor if:  You have cramps in your legs when you walk.  You have leg pain when you are at rest.  You have coldness in a leg or foot.  Your skin changes.  You are unable to get or have an erection (erectile dysfunction).  You have cuts or sores on your feet that do not heal. Get help right away if:  Your arm or leg turns cold, numb, and blue.  Your arms or legs become red, warm, swollen, painful, or numb.  You have chest pain.  You have trouble breathing.  You suddenly have weakness in your face, arm, or leg.  You become very confused or you cannot speak.  You suddenly have a very bad headache.  You suddenly cannot see. Summary  Peripheral vascular disease (PVD) is a disease of the blood vessels.  A simple term for PVD is poor circulation. Without treatment, PVD tends  to get worse.  Treatment may include exercise, low fat and low cholesterol diet, and quitting smoking. This information is not intended to replace advice given to you by your health care provider. Make sure you discuss any questions you have with your health care provider. Document Released: 02/15/2010  Document Revised: 11/03/2017 Document Reviewed: 12/29/2016 Elsevier Patient Education  2020 [employer].

## 2019-07-10 ENCOUNTER — Telehealth (HOSPITAL_COMMUNITY): Payer: Self-pay | Admitting: Rehabilitation

## 2019-07-10 NOTE — Telephone Encounter (Signed)

## 2019-07-11 ENCOUNTER — Ambulatory Visit (INDEPENDENT_AMBULATORY_CARE_PROVIDER_SITE_OTHER): Payer: Medicare Other | Admitting: Vascular Surgery

## 2019-07-11 ENCOUNTER — Encounter: Payer: Self-pay | Admitting: Vascular Surgery

## 2019-07-11 ENCOUNTER — Encounter: Payer: Self-pay | Admitting: *Deleted

## 2019-07-11 ENCOUNTER — Other Ambulatory Visit: Payer: Self-pay

## 2019-07-11 VITALS — BP 115/71 | HR 89 | Temp 97.7°F | Resp 20 | Ht 74.0 in | Wt 205.7 lb

## 2019-07-11 DIAGNOSIS — I6522 Occlusion and stenosis of left carotid artery: Secondary | ICD-10-CM

## 2019-07-11 DIAGNOSIS — I779 Disorder of arteries and arterioles, unspecified: Secondary | ICD-10-CM | POA: Diagnosis not present

## 2019-07-11 NOTE — Progress Notes (Signed)
Patient name: [name] MRN: [id number] DOB: Aug 27, 1946 Sex: male  REASON FOR CONSULT: Asymptomatic recurrent carotid stenosis  HPI: [name] is a 73 y.o. male, who underwent left carotid endarterectomy in 2007.  He then underwent carotid stenting for recurrence at the proximal patch site in 2011 with a 10 x 30 xact stent.  He has now developed progressive recurrent stenosis.  This was found on surveillance duplex exam a few weeks ago.  He has no symptoms of TIA amaurosis or stroke.  He is still smoking.  He is not really interested in quitting.  He is on aspirin and a statin.  He is not on Plavix.  He states this caused a rash in the past.  Other medical problems include chronic kidney disease stage III, hypertension, hyperlipidemia all of which are currently stable.  He has previously undergone right superficial femoral artery external iliac artery stenting bilaterally.  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  He has also undergone left common iliac artery stenting.  Most of these prior procedures were done by Dr. Albertine Patricia.  Past Medical History:  Diagnosis Date  . CKD (chronic kidney disease), stage III ([place])   . Dyspnea on exertion   . Edema   . HTN (hypertension)   . Hyperlipidemia   . Left carotid stenosis    a. 2007 s/p L CEA;  b. 2011 s/p L common carotid stenting 2/2 restenosis;  c. [id number] u/s: RICA [id number], LICA [id number], patent LCCA stent.  [name] PAD (peripheral artery disease) ([facility])    a. s/p prior RSFA, REIA, distal LSFA (known to be occluded), LEIA, and LCIA stenting;  b. 01/2015 ABI's: R 0.78, L 0.53.  [name] Stroke [facility])    Past Surgical History:  Procedure Laterality Date  . ABDOMINAL AORTAGRAM N/A 02/05/2015   Procedure: ABDOMINAL Maxcine Ham;  Surgeon: Conrad Mount Carmel, MD;  [facility] C[facility];  Service: Cardiovascular;  Laterality: N/A;  . carotid endarterecotomy    . external iliac     status post bilateral and left common iliac artery  . lumbar back surgery     x2  . LUMBAR DISC SURGERY     x 2  . stnt     lower extermity  . TENDON REPAIR     to the left arm  . TONSILLECTOMY      Family History  Problem Relation Age of Onset  . Hypertension Mother   . Hyperlipidemia Mother     SOCIAL HISTORY: Social History   Socioeconomic History  . Marital status: Married    Spouse name: Not on file  . Number of children: Not on file  . Years of education: Not on file  . Highest education level: Not on file  Occupational History  . Not on file  Social Needs  . Financial resource strain: Not on file  . Food insecurity    Worry: Not on file    Inability: Not on file  . Transportation needs    Medical: Not on file    Non-medical: Not on file  Tobacco Use  . Smoking status: Current Every Day Smoker    Packs/day: 1.00    Years: 50.00    Pack years: 50.00    Types: Cigarettes  . Smokeless tobacco: Never Used  Substance and Sexual Activity  . Alcohol use: Yes    Alcohol/week: 7.0 standard drinks    Types: 7 Glasses of wine per week  . Drug use: No  .  Sexual activity: Not on file  Lifestyle  . Physical activity    Days per week: Not on file    Minutes per session: Not on file  . Stress: Not on file  Relationships  . Social Herbalist on phone: Not on file    Gets together: Not on file    Attends religious service: Not on file    Active member of club or organization: Not on file    Attends meetings of clubs or organizations: Not on file    Relationship status: Not on file  . Intimate partner violence    Fear of current or ex partner: Not on file    Emotionally abused: Not on file    Physically abused: Not on file    Forced sexual activity: Not on file  Other Topics Concern  . Not on file  Social History Narrative  . Not on file    Allergies  Allergen Reactions  . Clopidogrel Rash    (plavix)    Current Outpatient Medications  Medication Sig Dispense Refill  . amLODipine (NORVASC) 10 MG tablet TAKE 1 TABLET  BY MOUTH EVERY DAY. 15 tablet 0  . aspirin 325 MG tablet Take 325 mg by mouth every evening.     . carvedilol (COREG) 12.5 MG tablet TAKE 1 TABLET BY MOUTH TWICE DAILY WITH MEALS. 30 tablet 0  . losartan-hydrochlorothiazide (HYZAAR) 50-12.5 MG per tablet Take 1 tablet by mouth daily.  11  . omeprazole (PRILOSEC) 20 MG capsule TAKE ONE CAPSULE BY MOUTH EVERY DAY AT 6AM    . rosuvastatin (CRESTOR) 10 MG tablet Take 10 mg by mouth every evening.      No current facility-administered medications for this visit.     ROS:   General:  No weight loss, Fever, chills  HEENT: No recent headaches, no nasal bleeding, no visual changes, no sore throat  Neurologic: No dizziness, blackouts, seizures. No recent symptoms of stroke or mini- stroke. No recent episodes of slurred speech, or temporary blindness.  Cardiac: No recent episodes of chest pain/pressure, no shortness of breath at rest.  No shortness of breath with exertion.  Denies history of atrial fibrillation or irregular heartbeat  Vascular: No history of rest pain in feet.  No history of claudication.  No history of non-healing ulcer, No history of DVT   Pulmonary: No home oxygen, no productive cough, no hemoptysis,  No asthma or wheezing  Musculoskeletal:  [ ]  Arthritis, [ ]  Low back pain,  [ ]  Joint pain  Hematologic:No history of hypercoagulable state.  No history of easy bleeding.  No history of anemia  Gastrointestinal: No hematochezia or melena,  No gastroesophageal reflux, no trouble swallowing  Urinary: [X]  chronic Kidney disease, [ ]  on HD - [ ]  MWF or [ ]  TTHS, [ ]  Burning with urination, [ ]  Frequent urination, [ ]  Difficulty urinating;   Skin: No rashes  Psychological: No history of anxiety,  No history of depression   Physical Examination   Vitals:   07/11/19 1053 07/11/19 1056  BP: 122/82 115/71  Pulse: 89   Resp: 20   Temp: 97.7 F (36.5 C)   SpO2: 96%   Weight: 205 lb 11.2 oz (93.3 kg)   Height: 6\' 2"  (1.88  m)     General:  Alert and oriented, no acute distress HEENT: Normal Neck: No JVD Pulmonary: Clear to auscultation bilaterally Cardiac: Regular Rate and Rhythm  Skin: No rash Extremity Pulses:  2+ radial,  brachial, femoral pulses bilaterally Musculoskeletal: No deformity or edema  Neurologic: Upper and lower extremity motor 5/5 and symmetric, no facial asymmetry  DATA:  I reviewed the patient's duplex exam today.  Velocities were 265/113 on the left side.  ASSESSMENT: Patient with recurrent left internal carotid artery stenosis status post prior carotid endarterectomy and subsequent stenting.  I discussed with the patient the possibility of CT Angio of the neck versus carotid angiogram.  Due to the fact he has some renal dysfunction I believe will use less contrast with the carotid angiogram.  I did inform him of the risk benefits possible complications and procedure details including not limited to bleeding infection stroke risk of 0.1%.  He understands and agrees to proceed.   PLAN: Carotid angiogram scheduled for July 19, 2019.  If this shows significant recurrent stenosis we would consider planning possible TCAR at a later date.   Ruta Hinds, MD [facility] Office: [phone] Pager: [phone]

## 2019-07-11 NOTE — H&P (View-Only) (Signed)
Patient name: [name] MRN: [id number] DOB: 09-25-1946 Sex: male  REASON FOR CONSULT: Asymptomatic recurrent carotid stenosis  HPI: [name] is a 73 y.o. male, who underwent left carotid endarterectomy in 2007.  He then underwent carotid stenting for recurrence at the proximal patch site in 2011 with a 10 x 30 xact stent.  He has now developed progressive recurrent stenosis.  This was found on surveillance duplex exam a few weeks ago.  He has no symptoms of TIA amaurosis or stroke.  He is still smoking.  He is not really interested in quitting.  He is on aspirin and a statin.  He is not on Plavix.  He states this caused a rash in the past.  Other medical problems include chronic kidney disease stage III, hypertension, hyperlipidemia all of which are currently stable.  He has previously undergone right superficial femoral artery external iliac artery stenting bilaterally.  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  He has also undergone left common iliac artery stenting.  Most of these prior procedures were done by Dr. Albertine Patricia.  Past Medical History:  Diagnosis Date  . CKD (chronic kidney disease), stage III ([place])   . Dyspnea on exertion   . Edema   . HTN (hypertension)   . Hyperlipidemia   . Left carotid stenosis    a. 2007 s/p L CEA;  b. 2011 s/p L common carotid stenting 2/2 restenosis;  c. Q000111Q u/s: RICA [id number], LICA [id number], patent LCCA stent.  [name] PAD (peripheral artery disease) (HCC)    a. s/p prior RSFA, REIA, distal LSFA (known to be occluded), LEIA, and LCIA stenting;  b. 01/2015 ABI's: R 0.78, L 0.53.  [name] Stroke [facility])    Past Surgical History:  Procedure Laterality Date  . ABDOMINAL AORTAGRAM N/A 02/05/2015   Procedure: ABDOMINAL Maxcine Ham;  Surgeon: Conrad Lincoln, MD;  Location: [facility] CATH LAB;  Service: Cardiovascular;  Laterality: N/A;  . carotid endarterecotomy    . external iliac     status post bilateral and left common iliac artery  . lumbar back surgery     x2  . LUMBAR DISC SURGERY     x 2  . stnt     lower extermity  . TENDON REPAIR     to the left arm  . TONSILLECTOMY      Family History  Problem Relation Age of Onset  . Hypertension Mother   . Hyperlipidemia Mother     SOCIAL HISTORY: Social History   Socioeconomic History  . Marital status: Married    Spouse name: Not on file  . Number of children: Not on file  . Years of education: Not on file  . Highest education level: Not on file  Occupational History  . Not on file  Social Needs  . Financial resource strain: Not on file  . Food insecurity    Worry: Not on file    Inability: Not on file  . Transportation needs    Medical: Not on file    Non-medical: Not on file  Tobacco Use  . Smoking status: Current Every Day Smoker    Packs/day: 1.00    Years: 50.00    Pack years: 50.00    Types: Cigarettes  . Smokeless tobacco: Never Used  Substance and Sexual Activity  . Alcohol use: Yes    Alcohol/week: 7.0 standard drinks    Types: 7 Glasses of wine per week  . Drug use: No  .  Sexual activity: Not on file  Lifestyle  . Physical activity    Days per week: Not on file    Minutes per session: Not on file  . Stress: Not on file  Relationships  . Social Herbalist on phone: Not on file    Gets together: Not on file    Attends religious service: Not on file    Active member of club or organization: Not on file    Attends meetings of clubs or organizations: Not on file    Relationship status: Not on file  . Intimate partner violence    Fear of current or ex partner: Not on file    Emotionally abused: Not on file    Physically abused: Not on file    Forced sexual activity: Not on file  Other Topics Concern  . Not on file  Social History Narrative  . Not on file    Allergies  Allergen Reactions  . Clopidogrel Rash    (plavix)    Current Outpatient Medications  Medication Sig Dispense Refill  . amLODipine (NORVASC) 10 MG tablet TAKE 1 TABLET  BY MOUTH EVERY DAY. 15 tablet 0  . aspirin 325 MG tablet Take 325 mg by mouth every evening.     . carvedilol (COREG) 12.5 MG tablet TAKE 1 TABLET BY MOUTH TWICE DAILY WITH MEALS. 30 tablet 0  . losartan-hydrochlorothiazide (HYZAAR) 50-12.5 MG per tablet Take 1 tablet by mouth daily.  11  . omeprazole (PRILOSEC) 20 MG capsule TAKE ONE CAPSULE BY MOUTH EVERY DAY AT 6AM    . rosuvastatin (CRESTOR) 10 MG tablet Take 10 mg by mouth every evening.      No current facility-administered medications for this visit.     ROS:   General:  No weight loss, Fever, chills  HEENT: No recent headaches, no nasal bleeding, no visual changes, no sore throat  Neurologic: No dizziness, blackouts, seizures. No recent symptoms of stroke or mini- stroke. No recent episodes of slurred speech, or temporary blindness.  Cardiac: No recent episodes of chest pain/pressure, no shortness of breath at rest.  No shortness of breath with exertion.  Denies history of atrial fibrillation or irregular heartbeat  Vascular: No history of rest pain in feet.  No history of claudication.  No history of non-healing ulcer, No history of DVT   Pulmonary: No home oxygen, no productive cough, no hemoptysis,  No asthma or wheezing  Musculoskeletal:  [ ]  Arthritis, [ ]  Low back pain,  [ ]  Joint pain  Hematologic:No history of hypercoagulable state.  No history of easy bleeding.  No history of anemia  Gastrointestinal: No hematochezia or melena,  No gastroesophageal reflux, no trouble swallowing  Urinary: [X]  chronic Kidney disease, [ ]  on HD - [ ]  MWF or [ ]  TTHS, [ ]  Burning with urination, [ ]  Frequent urination, [ ]  Difficulty urinating;   Skin: No rashes  Psychological: No history of anxiety,  No history of depression   Physical Examination   Vitals:   07/11/19 1053 07/11/19 1056  BP: 122/82 115/71  Pulse: 89   Resp: 20   Temp: 97.7 F (36.5 C)   SpO2: 96%   Weight: 205 lb 11.2 oz (93.3 kg)   Height: 6\' 2"  (1.88  m)     General:  Alert and oriented, no acute distress HEENT: Normal Neck: No JVD Pulmonary: Clear to auscultation bilaterally Cardiac: Regular Rate and Rhythm  Skin: No rash Extremity Pulses:  2+ radial,  brachial, femoral pulses bilaterally Musculoskeletal: No deformity or edema  Neurologic: Upper and lower extremity motor 5/5 and symmetric, no facial asymmetry  DATA:  I reviewed the patient's duplex exam today.  Velocities were 265/113 on the left side.  ASSESSMENT: Patient with recurrent left internal carotid artery stenosis status post prior carotid endarterectomy and subsequent stenting.  I discussed with the patient the possibility of CT Angio of the neck versus carotid angiogram.  Due to the fact he has some renal dysfunction I believe will use less contrast with the carotid angiogram.  I did inform him of the risk benefits possible complications and procedure details including not limited to bleeding infection stroke risk of 0.1%.  He understands and agrees to proceed.   PLAN: Carotid angiogram scheduled for July 19, 2019.  If this shows significant recurrent stenosis we would consider planning possible TCAR at a later date.   Ruta Hinds, MD [facility] [place] Office: [phone] Pager: [phone]

## 2019-07-11 NOTE — H&P (View-Only) (Signed)
Patient name: [name] MRN: [id number] DOB: 12-08-1945 Sex: male  REASON FOR CONSULT: Asymptomatic recurrent carotid stenosis  HPI: [name] is a 73 y.o. male, who underwent left carotid endarterectomy in 2007.  He then underwent carotid stenting for recurrence at the proximal patch site in 2011 with a 10 x 30 xact stent.  He has now developed progressive recurrent stenosis.  This was found on surveillance duplex exam a few weeks ago.  He has no symptoms of TIA amaurosis or stroke.  He is still smoking.  He is not really interested in quitting.  He is on aspirin and a statin.  He is not on Plavix.  He states this caused a rash in the past.  Other medical problems include chronic kidney disease stage III, hypertension, hyperlipidemia all of which are currently stable.  He has previously undergone right superficial femoral artery external iliac artery stenting bilaterally.  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  He has also undergone left common iliac artery stenting.  Most of these prior procedures were done by Dr. Albertine Patricia.  Past Medical History:  Diagnosis Date  . CKD (chronic kidney disease), stage III ([place])   . Dyspnea on exertion   . Edema   . HTN (hypertension)   . Hyperlipidemia   . Left carotid stenosis    a. 2007 s/p L CEA;  b. 2011 s/p L common carotid stenting 2/2 restenosis;  c. Q000111Q u/s: RICA [id number], LICA 123456, patent LCCA stent.  [name] PAD (peripheral artery disease) (HCC)    a. s/p prior RSFA, REIA, distal LSFA (known to be occluded), LEIA, and LCIA stenting;  b. 01/2015 ABI's: R 0.78, L 0.53.  [name] Stroke [facility])    Past Surgical History:  Procedure Laterality Date  . ABDOMINAL AORTAGRAM N/A 02/05/2015   Procedure: ABDOMINAL Maxcine Ham;  Surgeon: Conrad White, MD;  Location: [facility] CATH LAB;  Service: Cardiovascular;  Laterality: N/A;  . carotid endarterecotomy    . external iliac     status post bilateral and left common iliac artery  . lumbar back surgery     x2  . LUMBAR DISC SURGERY     x 2  . stnt     lower extermity  . TENDON REPAIR     to the left arm  . TONSILLECTOMY      Family History  Problem Relation Age of Onset  . Hypertension Mother   . Hyperlipidemia Mother     SOCIAL HISTORY: Social History   Socioeconomic History  . Marital status: Married    Spouse name: Not on file  . Number of children: Not on file  . Years of education: Not on file  . Highest education level: Not on file  Occupational History  . Not on file  Social Needs  . Financial resource strain: Not on file  . Food insecurity    Worry: Not on file    Inability: Not on file  . Transportation needs    Medical: Not on file    Non-medical: Not on file  Tobacco Use  . Smoking status: Current Every Day Smoker    Packs/day: 1.00    Years: 50.00    Pack years: 50.00    Types: Cigarettes  . Smokeless tobacco: Never Used  Substance and Sexual Activity  . Alcohol use: Yes    Alcohol/week: 7.0 standard drinks    Types: 7 Glasses of wine per week  . Drug use: No  .  Sexual activity: Not on file  Lifestyle  . Physical activity    Days per week: Not on file    Minutes per session: Not on file  . Stress: Not on file  Relationships  . Social Herbalist on phone: Not on file    Gets together: Not on file    Attends religious service: Not on file    Active member of club or organization: Not on file    Attends meetings of clubs or organizations: Not on file    Relationship status: Not on file  . Intimate partner violence    Fear of current or ex partner: Not on file    Emotionally abused: Not on file    Physically abused: Not on file    Forced sexual activity: Not on file  Other Topics Concern  . Not on file  Social History Narrative  . Not on file    Allergies  Allergen Reactions  . Clopidogrel Rash    (plavix)    Current Outpatient Medications  Medication Sig Dispense Refill  . amLODipine (NORVASC) 10 MG tablet TAKE 1 TABLET  BY MOUTH EVERY DAY. 15 tablet 0  . aspirin 325 MG tablet Take 325 mg by mouth every evening.     . carvedilol (COREG) 12.5 MG tablet TAKE 1 TABLET BY MOUTH TWICE DAILY WITH MEALS. 30 tablet 0  . losartan-hydrochlorothiazide (HYZAAR) 50-12.5 MG per tablet Take 1 tablet by mouth daily.  11  . omeprazole (PRILOSEC) 20 MG capsule TAKE ONE CAPSULE BY MOUTH EVERY DAY AT 6AM    . rosuvastatin (CRESTOR) 10 MG tablet Take 10 mg by mouth every evening.      No current facility-administered medications for this visit.     ROS:   General:  No weight loss, Fever, chills  HEENT: No recent headaches, no nasal bleeding, no visual changes, no sore throat  Neurologic: No dizziness, blackouts, seizures. No recent symptoms of stroke or mini- stroke. No recent episodes of slurred speech, or temporary blindness.  Cardiac: No recent episodes of chest pain/pressure, no shortness of breath at rest.  No shortness of breath with exertion.  Denies history of atrial fibrillation or irregular heartbeat  Vascular: No history of rest pain in feet.  No history of claudication.  No history of non-healing ulcer, No history of DVT   Pulmonary: No home oxygen, no productive cough, no hemoptysis,  No asthma or wheezing  Musculoskeletal:  [ ]  Arthritis, [ ]  Low back pain,  [ ]  Joint pain  Hematologic:No history of hypercoagulable state.  No history of easy bleeding.  No history of anemia  Gastrointestinal: No hematochezia or melena,  No gastroesophageal reflux, no trouble swallowing  Urinary: [X]  chronic Kidney disease, [ ]  on HD - [ ]  MWF or [ ]  TTHS, [ ]  Burning with urination, [ ]  Frequent urination, [ ]  Difficulty urinating;   Skin: No rashes  Psychological: No history of anxiety,  No history of depression   Physical Examination   Vitals:   07/11/19 1053 07/11/19 1056  BP: 122/82 115/71  Pulse: 89   Resp: 20   Temp: 97.7 F (36.5 C)   SpO2: 96%   Weight: 205 lb 11.2 oz (93.3 kg)   Height: 6\' 2"  (1.88  m)     General:  Alert and oriented, no acute distress HEENT: Normal Neck: No JVD Pulmonary: Clear to auscultation bilaterally Cardiac: Regular Rate and Rhythm  Skin: No rash Extremity Pulses:  2+ radial,  brachial, femoral pulses bilaterally Musculoskeletal: No deformity or edema  Neurologic: Upper and lower extremity motor 5/5 and symmetric, no facial asymmetry  DATA:  I reviewed the patient's duplex exam today.  Velocities were 265/113 on the left side.  ASSESSMENT: Patient with recurrent left internal carotid artery stenosis status post prior carotid endarterectomy and subsequent stenting.  I discussed with the patient the possibility of CT Angio of the neck versus carotid angiogram.  Due to the fact he has some renal dysfunction I believe will use less contrast with the carotid angiogram.  I did inform him of the risk benefits possible complications and procedure details including not limited to bleeding infection stroke risk of 0.1%.  He understands and agrees to proceed.   PLAN: Carotid angiogram scheduled for July 19, 2019.  If this shows significant recurrent stenosis we would consider planning possible TCAR at a later date.   Ruta Hinds, MD [facility] [place] Office: [phone] Pager: [phone]

## 2019-07-12 ENCOUNTER — Other Ambulatory Visit: Payer: Self-pay | Admitting: *Deleted

## 2019-07-19 ENCOUNTER — Ambulatory Visit (HOSPITAL_COMMUNITY)
Admission: RE | Admit: 2019-07-19 | Discharge: 2019-07-19 | Disposition: A | Discharge status: Home / Self Care | Payer: Medicare Other | Attending: Vascular Surgery | Admitting: Vascular Surgery

## 2019-07-19 ENCOUNTER — Other Ambulatory Visit: Payer: Self-pay | Admitting: *Deleted

## 2019-07-19 ENCOUNTER — Other Ambulatory Visit: Payer: Self-pay

## 2019-07-19 ENCOUNTER — Encounter (HOSPITAL_COMMUNITY)
Admission: RE | Disposition: A | Discharge status: Home / Self Care | Payer: Self-pay | Source: Home / Self Care | Attending: Vascular Surgery

## 2019-07-19 DIAGNOSIS — I129 Hypertensive chronic kidney disease with stage 1 through stage 4 chronic kidney disease, or unspecified chronic kidney disease: Secondary | ICD-10-CM | POA: Insufficient documentation

## 2019-07-19 DIAGNOSIS — I739 Peripheral vascular disease, unspecified: Secondary | ICD-10-CM | POA: Insufficient documentation

## 2019-07-19 DIAGNOSIS — Z7982 Long term (current) use of aspirin: Secondary | ICD-10-CM | POA: Diagnosis not present

## 2019-07-19 DIAGNOSIS — F17210 Nicotine dependence, cigarettes, uncomplicated: Secondary | ICD-10-CM | POA: Diagnosis not present

## 2019-07-19 DIAGNOSIS — Z79899 Other long term (current) drug therapy: Secondary | ICD-10-CM | POA: Diagnosis not present

## 2019-07-19 DIAGNOSIS — Z8673 Personal history of transient ischemic attack (TIA), and cerebral infarction without residual deficits: Secondary | ICD-10-CM | POA: Insufficient documentation

## 2019-07-19 DIAGNOSIS — N183 Chronic kidney disease, stage 3 (moderate): Secondary | ICD-10-CM | POA: Diagnosis not present

## 2019-07-19 DIAGNOSIS — E785 Hyperlipidemia, unspecified: Secondary | ICD-10-CM | POA: Diagnosis not present

## 2019-07-19 DIAGNOSIS — I6522 Occlusion and stenosis of left carotid artery: Secondary | ICD-10-CM | POA: Diagnosis present

## 2019-07-19 DIAGNOSIS — Z1159 Encounter for screening for other viral diseases: Secondary | ICD-10-CM | POA: Insufficient documentation

## 2019-07-19 HISTORY — PX: AORTIC ARCH ANGIOGRAPHY: CATH118224

## 2019-07-19 HISTORY — PX: CAROTID ANGIOGRAPHY: CATH118230

## 2019-07-19 LAB — POCT I-STAT, CHEM 8
BUN: 37 mg/dL — ABNORMAL HIGH (ref 8–23)
Calcium, Ion: 1.25 mmol/L (ref 1.15–1.40)
Chloride: 99 mmol/L (ref 98–111)
Creatinine, Ser: 1.8 mg/dL — ABNORMAL HIGH (ref 0.61–1.24)
Glucose, Bld: 82 mg/dL (ref 70–99)
HCT: 50 % (ref 39.0–52.0)
Hemoglobin: 17 g/dL (ref 13.0–17.0)
Potassium: 3.9 mmol/L (ref 3.5–5.1)
Sodium: 138 mmol/L (ref 135–145)
TCO2: 28 mmol/L (ref 22–32)

## 2019-07-19 LAB — SARS CORONAVIRUS 2 BY RT PCR (HOSPITAL ORDER, PERFORMED IN ~~LOC~~ HOSPITAL LAB): SARS Coronavirus 2: NEGATIVE

## 2019-07-19 SURGERY — AORTIC ARCH ANGIOGRAPHY
Anesthesia: LOCAL

## 2019-07-19 SURGERY — Surgical Case
Anesthesia: *Unknown

## 2019-07-19 MED ORDER — HEPARIN (PORCINE) IN NACL 1000-0.9 UT/500ML-% IV SOLN
INTRAVENOUS | Status: DC | PRN
  Administered 2019-07-19: 500 mL

## 2019-07-19 MED ORDER — HYDRALAZINE HCL 20 MG/ML IJ SOLN: 5.0000 mg | INTRAMUSCULAR | Status: DC | PRN

## 2019-07-19 MED ORDER — LABETALOL HCL 5 MG/ML IV SOLN: 10.0000 mg | INTRAVENOUS | Status: DC | PRN

## 2019-07-19 MED ORDER — LIDOCAINE HCL (PF) 1 % IJ SOLN
INTRAMUSCULAR | Status: DC | PRN
  Administered 2019-07-19: 16 mL via INTRADERMAL

## 2019-07-19 MED ORDER — SODIUM CHLORIDE 0.9 % IV SOLN
INTRAVENOUS | Status: DC
  Administered 2019-07-19 (×2): via INTRAVENOUS

## 2019-07-19 MED ORDER — SODIUM CHLORIDE 0.9 % IV SOLN: Freq: Once | INTRAVENOUS | Status: DC

## 2019-07-19 MED ORDER — IODIXANOL 320 MG/ML IV SOLN
INTRAVENOUS | Status: DC | PRN
  Administered 2019-07-19: 80 mL via INTRA_ARTERIAL

## 2019-07-19 MED ORDER — SODIUM CHLORIDE 0.9 % IV SOLN: 250.0000 mL | INTRAVENOUS | Status: DC | PRN

## 2019-07-19 MED ORDER — ONDANSETRON HCL 4 MG/2ML IJ SOLN: 4.0000 mg | Freq: Four times a day (QID) | INTRAMUSCULAR | Status: DC | PRN

## 2019-07-19 MED ORDER — LIDOCAINE HCL (PF) 1 % IJ SOLN
INTRAMUSCULAR | Status: AC
  Filled 2019-07-19: qty 30

## 2019-07-19 MED ORDER — SODIUM CHLORIDE 0.9 % IV SOLN: INTRAVENOUS | Status: DC

## 2019-07-19 MED ORDER — TICAGRELOR 60 MG PO TABS: 60.0000 mg | ORAL_TABLET | Freq: Two times a day (BID) | ORAL | 11 refills | Status: DC

## 2019-07-19 MED ORDER — SODIUM CHLORIDE 0.9% FLUSH: 3.0000 mL | Freq: Two times a day (BID) | INTRAVENOUS | Status: DC

## 2019-07-19 MED ORDER — ACETAMINOPHEN 325 MG PO TABS: 650.0000 mg | ORAL_TABLET | ORAL | Status: DC | PRN

## 2019-07-19 MED ORDER — SODIUM CHLORIDE 0.9% FLUSH: 3.0000 mL | INTRAVENOUS | Status: DC | PRN

## 2019-07-19 MED ORDER — OXYCODONE HCL 5 MG PO TABS: 5.0000 mg | ORAL_TABLET | ORAL | Status: DC | PRN

## 2019-07-19 MED ORDER — MORPHINE SULFATE (PF) 10 MG/ML IV SOLN: 2.0000 mg | INTRAVENOUS | Status: DC | PRN

## 2019-07-19 MED ORDER — HEPARIN (PORCINE) IN NACL 1000-0.9 UT/500ML-% IV SOLN
INTRAVENOUS | Status: AC
  Filled 2019-07-19: qty 1000

## 2019-07-19 SURGICAL SUPPLY — 11 items
CATH ANGIO 5F PIGTAIL 100CM (CATHETERS) ×1 IMPLANT
CATH ANGIO 5F SIM1 100CM (CATHETERS) ×1 IMPLANT
DEVICE CLOSURE PERCLS PRGLD 6F (VASCULAR PRODUCTS) IMPLANT
KIT PV (KITS) ×2 IMPLANT
PERCLOSE PROGLIDE 6F (VASCULAR PRODUCTS) ×2
SHEATH PINNACLE 5F 10CM (SHEATH) ×1 IMPLANT
SHEATH PROBE COVER 6X72 (BAG) ×1 IMPLANT
SYR MEDRAD MARK V 150ML (SYRINGE) ×1 IMPLANT
TRANSDUCER W/STOPCOCK (MISCELLANEOUS) ×2 IMPLANT
TRAY PV CATH (CUSTOM PROCEDURE TRAY) ×2 IMPLANT
WIRE HITORQ VERSACORE ST 145CM (WIRE) ×1 IMPLANT

## 2019-07-19 NOTE — Op Note (Addendum)
Procedure: Arch and left carotid angiogram  Preoperative diagnosis: Recurrent left internal carotid artery stenosis  Postoperative diagnosis: Same  Anesthesia: Local  Operative findings: #1 type II aortic arch  2.  80% distal left internal carotid artery stenosis  3.  5 French sheath right common femoral artery with pro-glide closure  Operative details: After obtaining form consent, the patient taken the PV lab.  The patient placed supine position Angie table.  Both groins were prepped and draped in usual sterile fashion.  Ultrasound was used to identify the right common femoral artery and femoral bifurcation.  These were patent.  Image was obtained for the patient's record.  Local anesthesia was demonstrated over the right common femoral artery.  An introducer needle was used to cannulate the right common femoral artery and an 035 versa core wire threaded up the abdominal aorta and fluoroscopic guidance.  Next a 5 French sheath placed over the guidewire in the right common femoral artery.  5 French pigtail catheter was then placed over the guidewire and these were advanced as a unit up into the ascending aorta.  5 French pigtail catheter was then used to create a arch aortogram.  This was done with a 60 degree LAO projection.  Patient is a type II arch.  The innominate artery is widely patent.  The vertebral artery is dominant on the right side.  There is an 80% stenosis of the left vertebral artery which comes off the aortic arch.  The left subclavian artery is patent.  The left common carotid artery is patent.  The right common carotid artery is patent.  The patient had an elevated creatinine so only the left carotid system was studied.  At this point the pigtail catheter was removed over guidewire and exchanged for a Simmons catheter.  This was used to engage the left common carotid artery and then contrast angiogram was obtained in AP and lateral projection.  Intracranial views were also  performed to be interpreted by the neuroradiologist.  The left internal carotid artery has a 50 to 60% stenosis at the proximal aspect of the stent which is about 3 cm above the clavicle.  The distal extent of the stent has an 80% narrowing.  There is good distal flow after that.  At this point the [facility] catheter was removed the 5 French sheath was removed over a Bentson wire and a pro-glide was fired in the right common femoral artery.  There was good hemostasis.  Patient was taken to the holding area in stable condition.  Operative management: The patient will be scheduled in 2 weeks for a transfemoral carotid stent redo procedure.  Ruta Hinds, MD [facility] [place] Office: [phone] Pager: [phone]

## 2019-07-19 NOTE — Progress Notes (Signed)
Discharge instructions reviewed with pt and his wife (via telephone) Both voice understanding.  

## 2019-07-19 NOTE — Progress Notes (Signed)
Ambulated to bathroom to void tol well  

## 2019-07-19 NOTE — Progress Notes (Signed)
Order noted for [place] called and asked states she just needed a hgb I stat was done and results called to her. States no need to do CBC and Bmet

## 2019-07-19 NOTE — Discharge Instructions (Signed)
Femoral Site Care °This sheet gives you information about how to care for yourself after your procedure. Your health care provider may also give you more specific instructions. If you have problems or questions, contact your health care provider. °What can I expect after the procedure? °After the procedure, it is common to have: °· Bruising that usually fades within 1-2 weeks. °· Tenderness at the site. °Follow these instructions at home: °Wound care °· Follow instructions from your health care provider about how to take care of your insertion site. Make sure you: °? Wash your hands with soap and water before you change your bandage (dressing). If soap and water are not available, use hand sanitizer. °? Change your dressing as told by your health care provider. °? Leave stitches (sutures), skin glue, or adhesive strips in place. These skin closures may need to stay in place for 2 weeks or longer. If adhesive strip edges start to loosen and curl up, you may trim the loose edges. Do not remove adhesive strips completely unless your health care provider tells you to do that. °· Do not take baths, swim, or use a hot tub until your health care provider approves. °· You may shower 24-48 hours after the procedure or as told by your health care provider. °? Gently wash the site with plain soap and water. °? Pat the area dry with a clean towel. °? Do not rub the site. This may cause bleeding. °· Do not apply powder or lotion to the site. Keep the site clean and dry. °· Check your femoral site every day for signs of infection. Check for: °? Redness, swelling, or pain. °? Fluid or blood. °? Warmth. °? Pus or a bad smell. °Activity °· For the first 2-3 days after your procedure, or as long as directed: °? Avoid climbing stairs as much as possible. °? Do not squat. °· Do not lift anything that is heavier than 10 lb (4.5 kg), or the limit that you are told, until your health care provider says that it is safe. °· Rest as  directed. °? Avoid sitting for a long time without moving. Get up to take short walks every 1-2 hours. °· Do not drive for 24 hours if you were given a medicine to help you relax (sedative). °General instructions °· Take over-the-counter and prescription medicines only as told by your health care provider. °· Keep all follow-up visits as told by your health care provider. This is important. °Contact a health care provider if you have: °· A fever or chills. °· You have redness, swelling, or pain around your insertion site. °Get help right away if: °· The catheter insertion area swells very fast. °· You pass out. °· You suddenly start to sweat or your skin gets clammy. °· The catheter insertion area is bleeding, and the bleeding does not stop when you hold steady pressure on the area. °· The area near or just beyond the catheter insertion site becomes pale, cool, tingly, or numb. °These symptoms may represent a serious problem that is an emergency. Do not wait to see if the symptoms will go away. Get medical help right away. Call your local emergency services (911 in the [place] Do not drive yourself to the hospital. °Summary °· After the procedure, it is common to have bruising that usually fades within 1-2 weeks. °· Check your femoral site every day for signs of infection. °· Do not lift anything that is heavier than 10 lb (4.5 kg), or the   limit that you are told, until your health care provider says that it is safe. °This information is not intended to replace advice given to you by your health care provider. Make sure you discuss any questions you have with your health care provider. °Document Released: 07/25/2014 Document Revised: 12/04/2017 Document Reviewed: 12/04/2017 °Elsevier Patient Education © 2020 [employer] ° °

## 2019-07-19 NOTE — Interval H&P Note (Signed)
History and Physical Interval Note:  07/19/2019 2:28 PM  [name]  has presented today for surgery, with the diagnosis of carotid stenosis.  The various methods of treatment have been discussed with the patient and family. After consideration of risks, benefits and other options for treatment, the patient has consented to  Procedure(s): AORTIC ARCH ANGIOGRAPHY (N/A) CAROTID ANGIOGRAPHY (N/A) as a surgical intervention.  The patient's history has been reviewed, patient examined, no change in status, stable for surgery.  I have reviewed the patient's chart and labs.  Questions were answered to the patient's satisfaction.     Ruta Hinds

## 2019-07-22 ENCOUNTER — Encounter (HOSPITAL_COMMUNITY): Payer: Self-pay | Admitting: Vascular Surgery

## 2019-07-23 ENCOUNTER — Encounter (HOSPITAL_COMMUNITY): Payer: Self-pay | Admitting: Vascular Surgery

## 2019-07-24 ENCOUNTER — Encounter (HOSPITAL_COMMUNITY): Payer: Self-pay | Admitting: Vascular Surgery

## 2019-08-01 ENCOUNTER — Other Ambulatory Visit (HOSPITAL_COMMUNITY)
Admission: RE | Admit: 2019-08-01 | Discharge: 2019-08-01 | Disposition: A | Discharge status: Home / Self Care | Payer: Medicare Other | Source: Ambulatory Visit | Attending: Vascular Surgery | Admitting: Vascular Surgery

## 2019-08-01 DIAGNOSIS — Z20828 Contact with and (suspected) exposure to other viral communicable diseases: Secondary | ICD-10-CM | POA: Insufficient documentation

## 2019-08-01 DIAGNOSIS — Z01812 Encounter for preprocedural laboratory examination: Secondary | ICD-10-CM | POA: Insufficient documentation

## 2019-08-01 LAB — SARS CORONAVIRUS 2 (TAT 6-24 HRS): SARS Coronavirus 2: NEGATIVE

## 2019-08-02 ENCOUNTER — Encounter (HOSPITAL_COMMUNITY)
Admission: RE | Disposition: A | Discharge status: Home / Self Care | Payer: Self-pay | Source: Ambulatory Visit | Attending: Vascular Surgery

## 2019-08-02 ENCOUNTER — Encounter (HOSPITAL_COMMUNITY): Payer: Self-pay | Admitting: Vascular Surgery

## 2019-08-02 ENCOUNTER — Inpatient Hospital Stay (HOSPITAL_COMMUNITY)
Admission: RE | Admit: 2019-08-02 | Discharge: 2019-08-03 | DRG: 036 | Disposition: A | Discharge status: Home / Self Care | Payer: Medicare Other | Source: Ambulatory Visit | Attending: Vascular Surgery | Admitting: Vascular Surgery

## 2019-08-02 ENCOUNTER — Other Ambulatory Visit: Payer: Self-pay

## 2019-08-02 DIAGNOSIS — E785 Hyperlipidemia, unspecified: Secondary | ICD-10-CM | POA: Diagnosis present

## 2019-08-02 DIAGNOSIS — Z7982 Long term (current) use of aspirin: Secondary | ICD-10-CM | POA: Diagnosis not present

## 2019-08-02 DIAGNOSIS — Z8673 Personal history of transient ischemic attack (TIA), and cerebral infarction without residual deficits: Secondary | ICD-10-CM | POA: Diagnosis not present

## 2019-08-02 DIAGNOSIS — N183 Chronic kidney disease, stage 3 (moderate): Secondary | ICD-10-CM | POA: Diagnosis present

## 2019-08-02 DIAGNOSIS — I129 Hypertensive chronic kidney disease with stage 1 through stage 4 chronic kidney disease, or unspecified chronic kidney disease: Secondary | ICD-10-CM | POA: Diagnosis present

## 2019-08-02 DIAGNOSIS — F17210 Nicotine dependence, cigarettes, uncomplicated: Secondary | ICD-10-CM | POA: Diagnosis present

## 2019-08-02 DIAGNOSIS — I739 Peripheral vascular disease, unspecified: Secondary | ICD-10-CM | POA: Diagnosis present

## 2019-08-02 DIAGNOSIS — Z20828 Contact with and (suspected) exposure to other viral communicable diseases: Secondary | ICD-10-CM | POA: Diagnosis present

## 2019-08-02 DIAGNOSIS — I6522 Occlusion and stenosis of left carotid artery: Secondary | ICD-10-CM

## 2019-08-02 DIAGNOSIS — Z79899 Other long term (current) drug therapy: Secondary | ICD-10-CM

## 2019-08-02 DIAGNOSIS — Z8249 Family history of ischemic heart disease and other diseases of the circulatory system: Secondary | ICD-10-CM | POA: Diagnosis not present

## 2019-08-02 DIAGNOSIS — Z888 Allergy status to other drugs, medicaments and biological substances status: Secondary | ICD-10-CM | POA: Diagnosis not present

## 2019-08-02 HISTORY — PX: CAROTID PTA/STENT INTERVENTION: CATH118231

## 2019-08-02 LAB — POCT I-STAT, CHEM 8
BUN: 30 mg/dL — ABNORMAL HIGH (ref 8–23)
Calcium, Ion: 1.17 mmol/L (ref 1.15–1.40)
Chloride: 100 mmol/L (ref 98–111)
Creatinine, Ser: 1.8 mg/dL — ABNORMAL HIGH (ref 0.61–1.24)
Glucose, Bld: 83 mg/dL (ref 70–99)
HCT: 48 % (ref 39.0–52.0)
Hemoglobin: 16.3 g/dL (ref 13.0–17.0)
Potassium: 3.8 mmol/L (ref 3.5–5.1)
Sodium: 137 mmol/L (ref 135–145)
TCO2: 25 mmol/L (ref 22–32)

## 2019-08-02 LAB — CBC
HCT: 45.1 % (ref 39.0–52.0)
Hemoglobin: 15.7 g/dL (ref 13.0–17.0)
MCH: 31.4 pg (ref 26.0–34.0)
MCHC: 34.8 g/dL (ref 30.0–36.0)
MCV: 90.2 fL (ref 80.0–100.0)
Platelets: 173 10*3/uL (ref 150–400)
RBC: 5 MIL/uL (ref 4.22–5.81)
RDW: 13.2 % (ref 11.5–15.5)
WBC: 13.5 10*3/uL — ABNORMAL HIGH (ref 4.0–10.5)
nRBC: 0 % (ref 0.0–0.2)

## 2019-08-02 LAB — POCT ACTIVATED CLOTTING TIME
Activated Clotting Time: 290 seconds
Activated Clotting Time: 169 seconds

## 2019-08-02 LAB — CREATININE, SERUM
Creatinine, Ser: 1.54 mg/dL — ABNORMAL HIGH (ref 0.61–1.24)
GFR calc Af Amer: 51 mL/min — ABNORMAL LOW (ref >60)
GFR calc non Af Amer: 44 mL/min — ABNORMAL LOW (ref >60)

## 2019-08-02 SURGERY — CAROTID PTA/STENT INTERVENTION
Anesthesia: LOCAL | Laterality: Left

## 2019-08-02 MED ORDER — CARVEDILOL 12.5 MG PO TABS
12.5000 mg | ORAL_TABLET | Freq: Two times a day (BID) | ORAL | Status: DC
  Administered 2019-08-03: 12.5 mg via ORAL
  Filled 2019-08-02: qty 1
  Filled 2019-08-02: qty 2

## 2019-08-02 MED ORDER — SODIUM CHLORIDE 0.9 % IV SOLN: 250.0000 mL | INTRAVENOUS | Status: DC | PRN

## 2019-08-02 MED ORDER — BIVALIRUDIN BOLUS VIA INFUSION - CUPID
INTRAVENOUS | Status: DC | PRN
  Administered 2019-08-02: 08:00:00 69.75 mg via INTRAVENOUS

## 2019-08-02 MED ORDER — PHENOL 1.4 % MT LIQD: 1.0000 | OROMUCOSAL | Status: DC | PRN

## 2019-08-02 MED ORDER — CEFAZOLIN SODIUM-DEXTROSE 2-4 GM/100ML-% IV SOLN
2.0000 g | Freq: Three times a day (TID) | INTRAVENOUS | Status: AC
  Administered 2019-08-02 (×2): 2 g via INTRAVENOUS
  Filled 2019-08-02 (×3): qty 100

## 2019-08-02 MED ORDER — ACETAMINOPHEN 325 MG RE SUPP: 325.0000 mg | RECTAL | Status: DC | PRN

## 2019-08-02 MED ORDER — IODIXANOL 320 MG/ML IV SOLN
INTRAVENOUS | Status: DC | PRN
  Administered 2019-08-02: 80 mL via INTRAVENOUS

## 2019-08-02 MED ORDER — OXYCODONE HCL 5 MG PO TABS
5.0000 mg | ORAL_TABLET | ORAL | Status: DC | PRN
  Administered 2019-08-02 (×2): 5 mg via ORAL
  Administered 2019-08-03: 10 mg via ORAL
  Filled 2019-08-02 (×2): qty 1
  Filled 2019-08-02: qty 2

## 2019-08-02 MED ORDER — AMLODIPINE BESYLATE 10 MG PO TABS
10.0000 mg | ORAL_TABLET | Freq: Every day | ORAL | Status: DC
  Administered 2019-08-03: 10 mg via ORAL
  Filled 2019-08-02: qty 1

## 2019-08-02 MED ORDER — ACETAMINOPHEN 325 MG PO TABS: 650.0000 mg | ORAL_TABLET | ORAL | Status: DC | PRN

## 2019-08-02 MED ORDER — METOPROLOL TARTRATE 5 MG/5ML IV SOLN: 2.0000 mg | INTRAVENOUS | Status: DC | PRN

## 2019-08-02 MED ORDER — LOSARTAN POTASSIUM 50 MG PO TABS
50.0000 mg | ORAL_TABLET | Freq: Every day | ORAL | Status: DC
  Administered 2019-08-03: 50 mg via ORAL
  Filled 2019-08-02: qty 1

## 2019-08-02 MED ORDER — SODIUM CHLORIDE 0.9% FLUSH
3.0000 mL | Freq: Two times a day (BID) | INTRAVENOUS | Status: DC
  Administered 2019-08-02: 3 mL via INTRAVENOUS

## 2019-08-02 MED ORDER — HEPARIN SODIUM (PORCINE) 5000 UNIT/ML IJ SOLN: 5000.0000 [IU] | Freq: Three times a day (TID) | INTRAMUSCULAR | Status: DC

## 2019-08-02 MED ORDER — HYDROCHLOROTHIAZIDE 12.5 MG PO CAPS
12.5000 mg | ORAL_CAPSULE | Freq: Every day | ORAL | Status: DC
  Administered 2019-08-03: 12.5 mg via ORAL
  Filled 2019-08-02: qty 1

## 2019-08-02 MED ORDER — NOREPINEPHRINE 4 MG/250ML-% IV SOLN
INTRAVENOUS | Status: AC
  Filled 2019-08-02: qty 250

## 2019-08-02 MED ORDER — TICAGRELOR 60 MG PO TABS
60.0000 mg | ORAL_TABLET | Freq: Two times a day (BID) | ORAL | Status: DC
  Administered 2019-08-02 – 2019-08-03 (×2): 60 mg via ORAL
  Filled 2019-08-02 (×3): qty 1

## 2019-08-02 MED ORDER — SODIUM CHLORIDE 0.9 % IV SOLN
INTRAVENOUS | Status: AC
  Administered 2019-08-02: 16:00:00 via INTRAVENOUS

## 2019-08-02 MED ORDER — SODIUM CHLORIDE 0.9% FLUSH: 3.0000 mL | INTRAVENOUS | Status: DC | PRN

## 2019-08-02 MED ORDER — HYDRALAZINE HCL 20 MG/ML IJ SOLN: 5.0000 mg | INTRAMUSCULAR | Status: DC | PRN

## 2019-08-02 MED ORDER — SODIUM CHLORIDE 0.9 % IV SOLN
INTRAVENOUS | Status: DC | PRN
  Administered 2019-08-02: 1.75 mg/kg/h via INTRAVENOUS

## 2019-08-02 MED ORDER — LABETALOL HCL 5 MG/ML IV SOLN: 10.0000 mg | INTRAVENOUS | Status: DC | PRN

## 2019-08-02 MED ORDER — HEPARIN (PORCINE) IN NACL 1000-0.9 UT/500ML-% IV SOLN
INTRAVENOUS | Status: AC
  Filled 2019-08-02: qty 1000

## 2019-08-02 MED ORDER — GUAIFENESIN-DM 100-10 MG/5ML PO SYRP: 15.0000 mL | ORAL_SOLUTION | ORAL | Status: DC | PRN

## 2019-08-02 MED ORDER — ONDANSETRON HCL 4 MG/2ML IJ SOLN: 4.0000 mg | Freq: Four times a day (QID) | INTRAMUSCULAR | Status: DC | PRN

## 2019-08-02 MED ORDER — POTASSIUM CHLORIDE CRYS ER 20 MEQ PO TBCR: 20.0000 meq | EXTENDED_RELEASE_TABLET | Freq: Every day | ORAL | Status: DC | PRN

## 2019-08-02 MED ORDER — ATROPINE SULFATE 1 MG/10ML IJ SOSY
PREFILLED_SYRINGE | INTRAMUSCULAR | Status: AC
  Filled 2019-08-02: qty 10

## 2019-08-02 MED ORDER — ASPIRIN 325 MG PO TABS: 325.0000 mg | ORAL_TABLET | Freq: Every evening | ORAL | Status: DC

## 2019-08-02 MED ORDER — BIVALIRUDIN TRIFLUOROACETATE 250 MG IV SOLR
INTRAVENOUS | Status: AC
  Filled 2019-08-02: qty 250

## 2019-08-02 MED ORDER — DOCUSATE SODIUM 100 MG PO CAPS
100.0000 mg | ORAL_CAPSULE | Freq: Every day | ORAL | Status: DC
  Administered 2019-08-03: 100 mg via ORAL
  Filled 2019-08-02: qty 1

## 2019-08-02 MED ORDER — HEPARIN SODIUM (PORCINE) 5000 UNIT/ML IJ SOLN
5000.0000 [IU] | Freq: Three times a day (TID) | INTRAMUSCULAR | Status: DC
  Administered 2019-08-02 – 2019-08-03 (×2): 5000 [IU] via SUBCUTANEOUS
  Filled 2019-08-02 (×2): qty 1

## 2019-08-02 MED ORDER — SODIUM CHLORIDE 0.9 % IV SOLN: 500.0000 mL | Freq: Once | INTRAVENOUS | Status: DC | PRN

## 2019-08-02 MED ORDER — HEPARIN (PORCINE) IN NACL 1000-0.9 UT/500ML-% IV SOLN
INTRAVENOUS | Status: DC | PRN
  Administered 2019-08-02 (×2): 500 mL

## 2019-08-02 MED ORDER — ACETAMINOPHEN 325 MG PO TABS
325.0000 mg | ORAL_TABLET | ORAL | Status: DC | PRN
  Administered 2019-08-02: 650 mg via ORAL
  Filled 2019-08-02: qty 2

## 2019-08-02 MED ORDER — LIDOCAINE HCL (PF) 1 % IJ SOLN
INTRAMUSCULAR | Status: AC
  Filled 2019-08-02: qty 30

## 2019-08-02 MED ORDER — ALUM & MAG HYDROXIDE-SIMETH 200-200-20 MG/5ML PO SUSP: 15.0000 mL | ORAL | Status: DC | PRN

## 2019-08-02 MED ORDER — PANTOPRAZOLE SODIUM 40 MG PO TBEC: 40.0000 mg | DELAYED_RELEASE_TABLET | Freq: Every day | ORAL | Status: DC

## 2019-08-02 MED ORDER — MAGNESIUM SULFATE 2 GM/50ML IV SOLN: 2.0000 g | Freq: Every day | INTRAVENOUS | Status: DC | PRN

## 2019-08-02 MED ORDER — PANTOPRAZOLE SODIUM 40 MG PO TBEC
40.0000 mg | DELAYED_RELEASE_TABLET | Freq: Every day | ORAL | Status: DC
  Administered 2019-08-02 – 2019-08-03 (×2): 40 mg via ORAL
  Filled 2019-08-02 (×2): qty 1

## 2019-08-02 MED ORDER — ROSUVASTATIN CALCIUM 5 MG PO TABS: 10.0000 mg | ORAL_TABLET | Freq: Every evening | ORAL | Status: DC

## 2019-08-02 MED ORDER — LOSARTAN POTASSIUM-HCTZ 50-12.5 MG PO TABS: 1.0000 | ORAL_TABLET | Freq: Every day | ORAL | Status: DC

## 2019-08-02 MED ORDER — MORPHINE SULFATE (PF) 2 MG/ML IV SOLN: 2.0000 mg | INTRAVENOUS | Status: DC | PRN

## 2019-08-02 MED ORDER — SODIUM CHLORIDE 0.9 % IV SOLN
INTRAVENOUS | Status: DC
  Administered 2019-08-02: 06:00:00 via INTRAVENOUS

## 2019-08-02 MED ORDER — LIDOCAINE HCL (PF) 1 % IJ SOLN
INTRAMUSCULAR | Status: DC | PRN
  Administered 2019-08-02: 15 mL via INTRADERMAL

## 2019-08-02 SURGICAL SUPPLY — 22 items
BALLN VIATRAC 5X20X135 (BALLOONS) ×2
BALLN ~~LOC~~ EMERGE MR 3.0X20 (BALLOONS) ×2
BALLOON VIATRAC 5X20X135 (BALLOONS) IMPLANT
BALLOON ~~LOC~~ EMERGE MR 3.0X20 (BALLOONS) IMPLANT
CATH ANGIO 5F PIGTAIL 100CM (CATHETERS) ×1 IMPLANT
CATH ANGIO 5F SIM1 100CM (CATHETERS) ×1 IMPLANT
CATH HEADHUNTER 5F 125CM (CATHETERS) ×1 IMPLANT
CATH INFINITI VERT 5FR 125CM (CATHETERS) ×1 IMPLANT
DEVICE EMBOSHIELD NAV6 4.0-7.0 (FILTER) ×1 IMPLANT
ELECT DEFIB PAD ADLT CADENCE (PAD) ×1 IMPLANT
KIT ENCORE 26 ADVANTAGE (KITS) ×1 IMPLANT
KIT PV (KITS) ×2 IMPLANT
SHEATH PINNACLE 6F 10CM (SHEATH) ×1 IMPLANT
SHEATH PROBE COVER 6X72 (BAG) ×1 IMPLANT
SHEATH SHUTTLE SELECT 6F (SHEATH) ×1 IMPLANT
STENT XACT CAR 9-7X40X136 (Permanent Stent) ×1 IMPLANT
SYR MEDRAD MARK V 150ML (SYRINGE) ×1 IMPLANT
TRANSDUCER W/STOPCOCK (MISCELLANEOUS) ×2 IMPLANT
TRAY PV CATH (CUSTOM PROCEDURE TRAY) ×2 IMPLANT
WIRE AMPLATZ SSTIFF .035X260CM (WIRE) ×1 IMPLANT
WIRE HITORQ VERSACORE ST 145CM (WIRE) ×1 IMPLANT
WIRE VERSACORE LOC 115CM (WIRE) ×1 IMPLANT

## 2019-08-02 NOTE — Interval H&P Note (Signed)
History and Physical Interval Note:  08/02/2019 7:21 AM  [name]  has presented today for surgery, with the diagnosis of Restenosis.  The various methods of treatment have been discussed with the patient and family. After consideration of risks, benefits and other options for treatment, the patient has consented to  Procedure(s): CAROTID PTA/STENT INTERVENTION (Left) as a surgical intervention.  The patient's history has been reviewed, patient examined, no change in status, stable for surgery.  I have reviewed the patient's chart and labs.  Questions were answered to the patient's satisfaction.     Ruta Hinds

## 2019-08-02 NOTE — Progress Notes (Signed)
    6 Fr sheath was pulled manually from the R FA,  and pressure was held for 20 min. Sterile gauze was applied at the site. R groin is soft and non tender.   R PT was obtained with doppler before and after the sheath pull.   Bed rest started at 1220 X 5 hr. Instructions were given to patient about this time.  HR 90's AF BP 122/62 sPO2 98% on RA

## 2019-08-02 NOTE — Progress Notes (Signed)
Patient awake and alert in the holding area.  No numbness or tingling in his extremities.  No slurred speech.  No facial asymmetry.  Upper and lower extremity motor strength is 5/5.  Vitals:   08/02/19 0913 08/02/19 0918 08/02/19 0923 08/02/19 0935  BP: 123/83 134/79 133/78 (!) 151/96  Pulse: 83 66 97   Resp: 18 18 11    Temp:      TempSrc:      SpO2: 99% 97% (!) 0%   Weight:      Height:      \   Patient awaiting room on 4 E.  Most likely discharge tomorrow morning if no difficulty overnight.  Ruta Hinds, MD [facility] [place] Office: [phone] Pager: [phone]

## 2019-08-02 NOTE — Op Note (Addendum)
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   Operative findings: Right femoral sheath.  Left ICA stenosis treated 80% to 0% residual Abbott xact stent 7 x 9 x 40  Operative details: After obtaining informed consent, the patient was taken to the [place] lab. The patient was placed in supine position the Angio table. Both groins were prepped and draped in usual sterile fashion. Local anesthesia was infiltrated over the right common femoral artery. Ultrasound was used to locate the right common femoral artery.  An introducer needle was placed into the right common femoral artery and there was good backbleeding from this. A 0.035 Versacore wire was then threaded up the abdominal aorta under fluoroscopic guidance. A 6 Fr shuttle sheath was placed over the guidewire into the right common femoral artery.  This was thoroughly flushed with heparinized saline.  The sheath was advanced all the way up over the wire to the aortic arch.  I then used a vertebral catheter and made several attempts at cannulating the left common carotid without success.  This was then switched over to an H1 catheter and I was able to successfully cannulate the left common carotid.  The guidewire was advanced all the way up to the carotid bifurcation.  Contrast angiogram was performed to confirm that we were within the left carotid system.  There is a 80% stenosis of the left internal carotid with moderate calcification.  There was also some mild narrowing of the proximal common carotid but this appeared to be only about 40 to 50%.  To treat this area would have potentially required 2 stents so I did not elect to treat this proximal portion.  The external carotid is patent.  Measurements were taken of the proximal  distal and length of the lesion.  7 x 9 x 40 Abbott xact stent was selected.  An 035 Amplatz wire was then placed throught the H1 catheter and advanced to just below the carotid bifurcation.  The 6 French Cook shuttle sheath was then brought up advanced over the guidewire up into the distal left common carotid artery.  This was advanced over the catheter to the distal right common carotid artery.  Angiomax bolus was given.  ACT was also checked and was confirmed 290. At this point an Abbott 7 mm filter was brought on the operative field and this was advanced up to the level of the carotid stenosis.  With some manipulation the guidewire advanced through the stenosis with minimal difficulty. The filter was then advanced into the distal internal carotid artery at the level of the skull base. The filter was then deployed in this location. At this point a repeat contrast angiogram was performed to again determined the level of the stenosis. A 3 x 2 angioplasty balloon was brought in operative field and advanced to the level of stenosis and quickly inflated and deflated to 8 atmospheres. The predilatation balloon was removed. A 7 x 9 x 40 Abbott xact stent was then brought in the operative field and advanced to the level of stenosis. Again using angiographic guidance, the stent was deployed so that the tip of the stent was past the level of the high-grade stenosis. The stent was then deployed . A postdilatation was then performed after the stent delivery device was removed. This was done with a 5 x  2 balloon inflated to 8 atmospheres up and down.   A completion arteriogram was performed which showed that the residual stenosis was essentially 0 and the stent had good wall apposition the distal internal carotid as well as in the proximal stent in the common carotid that had been previously placed.  The filter was retrieved Completion intracranial views were also performed in AP and lateral projection to be interpreted later  by the neuroradiologist. The patient tolerated procedure well and there were no complications. After the completion arteriogram had been performed, the sheath was pulled back over a guidewire and these were then pulled down to the guidewire and the sheath exchanged for a 6 French short sheath in the right femoral artery. This was thoroughly flushed with heparinized saline and was left in place to be pulled in the holding area after the ACT is less than 175.the filter retrieval system was examined under fluoroscopy and the filter was intact.  We also cut open the filter retrieval system and the filter was noted to be within this.  The patient tolerated the procedure well and there were no neurologic complications. The patient was transported to the holding area in stable condition.   Ruta Hinds, MD  [facility]  Office: [phone]  Pager: [phone]

## 2019-08-03 LAB — CBC
HCT: 43.5 % (ref 39.0–52.0)
Hemoglobin: 14.6 g/dL (ref 13.0–17.0)
MCH: 30.5 pg (ref 26.0–34.0)
MCHC: 33.6 g/dL (ref 30.0–36.0)
MCV: 90.8 fL (ref 80.0–100.0)
Platelets: 155 10*3/uL (ref 150–400)
RBC: 4.79 MIL/uL (ref 4.22–5.81)
RDW: 13 % (ref 11.5–15.5)
WBC: 12.4 10*3/uL — ABNORMAL HIGH (ref 4.0–10.5)
nRBC: 0 % (ref 0.0–0.2)

## 2019-08-03 LAB — BASIC METABOLIC PANEL
Anion gap: 9 (ref 5–15)
BUN: 22 mg/dL (ref 8–23)
CO2: 24 mmol/L (ref 22–32)
Calcium: 8.6 mg/dL — ABNORMAL LOW (ref 8.9–10.3)
Chloride: 104 mmol/L (ref 98–111)
Creatinine, Ser: 1.46 mg/dL — ABNORMAL HIGH (ref 0.61–1.24)
GFR calc Af Amer: 54 mL/min — ABNORMAL LOW (ref >60)
GFR calc non Af Amer: 47 mL/min — ABNORMAL LOW (ref >60)
Glucose, Bld: 85 mg/dL (ref 70–99)
Potassium: 3.6 mmol/L (ref 3.5–5.1)
Sodium: 137 mmol/L (ref 135–145)

## 2019-08-03 NOTE — Discharge Summary (Signed)
Discharge Summary     [name] 01/11/1946 73 y.o. male  [id number]  Admission Date: 08/02/2019  Discharge Date: 08/03/2019  Physician: Elam Dutch, MD  Admission Diagnosis: Left carotid artery stenosis [I65.22]   HPI:   This is a 73 y.o. male who underwent left carotid endarterectomy in 2007.  He then underwent carotid stenting for recurrence at the proximal patch site in 2011 with a 10 x 30 xact stent.  He has now developed progressive recurrent stenosis.  This was found on surveillance duplex exam a few weeks ago.  He has no symptoms of TIA amaurosis or stroke.  He is still smoking.  He is not really interested in quitting.  He is on aspirin and a statin.  He is not on Plavix.  He states this caused a rash in the past.  Other medical problems include chronic kidney disease stage III, hypertension, hyperlipidemia all of which are currently stable.  He has previously undergone right superficial femoral artery external iliac artery stenting bilaterally.  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  He has also undergone left common iliac artery stenting.  Most of these prior procedures were done by Dr. Albertine Patricia.  Hospital Course:  The patient was admitted to the hospital and taken to the operating room on 08/02/2019 and underwent Left carotid angiogram, left carotid angioplasty and stenting with embolic protection     Findings: Right femoral sheath.  Left ICA stenosis treated 80% to 0% residual Abbott xact stent 7 x 9 x 40  The pt tolerated the procedure well and was transported to the PACU in good condition.   By POD 1, the pt neuro status was in tact and pt doing well.    The remainder of the hospital course consisted of increasing mobilization and increasing intake of solids without difficulty.   Recent Labs    08/02/19 0606 08/03/19 0238  NA 137 137  K 3.8 3.6  CL 100 104  CO2  --  24  GLUCOSE 83 85  BUN 30* 22  CALCIUM  --  8.6*   Recent Labs   08/02/19 1506 08/03/19 0238  WBC 13.5* 12.4*  HGB 15.7 14.6  HCT 45.1 43.5  PLT 173 155   No results for input(s): INR in the last 72 hours.   Discharge Instructions    CAROTID Sugery: Call MD for difficulty swallowing or speaking; weakness in arms or legs that is a new symtom; severe headache.  If you have increased swelling in the neck and/or  are having difficulty breathing, CALL 911   Complete by: As directed    Call MD for:  redness, tenderness, or signs of infection (pain, swelling, bleeding, redness, odor or green/yellow discharge around incision site)   Complete by: As directed    Call MD for:  severe or increased pain, loss or decreased feeling  in affected limb(s)   Complete by: As directed    Call MD for:  temperature >100.5   Complete by: As directed    Discharge wound care:   Complete by: As directed    Shower daily with soap and water starting 08/03/2019   Driving Restrictions   Complete by: As directed    No driving for 72 hours and while taking pain medication   Lifting restrictions   Complete by: As directed    No heavy lifting for 2 weeks   Resume previous diet   Complete by: As directed  Discharge Diagnosis:  Left carotid artery stenosis [I65.22]  Secondary Diagnosis: Patient Active Problem List   Diagnosis Date Noted  . Left carotid artery stenosis 08/02/2019  . Dyspnea on exertion   . CKD (chronic kidney disease), stage III ([place])   . PAD (peripheral artery disease) ([place])   . Hyperlipidemia   . HTN (hypertension)   . Left carotid stenosis   . Occlusion and stenosis of carotid artery without mention of cerebral infarction 03/06/2012  . HYPERTENSION, BENIGN 03/12/2009  . Atherosclerosis of native arteries of extremity with intermittent claudication ([place]) 03/12/2009   Past Medical History:  Diagnosis Date  . CKD (chronic kidney disease), stage III ([place])   . Dyspnea on exertion   . Edema   . HTN (hypertension)   . Hyperlipidemia   . Left  carotid stenosis    a. 2007 s/p L CEA;  b. 2011 s/p L common carotid stenting 2/2 restenosis;  c. [id number] u/s: RICA [id number], LICA [id number], patent LCCA stent.  [name] PAD (peripheral artery disease) ([facility])    a. s/p prior RSFA, REIA, distal LSFA (known to be occluded), LEIA, and LCIA stenting;  b. 01/2015 ABI's: R 0.78, L 0.53.  [name] Stroke [facility])     Allergies as of 08/03/2019      Reactions   Plavix [clopidogrel] Rash      Medication List    TAKE these medications   amLODipine 10 MG tablet Commonly known as: NORVASC TAKE 1 TABLET BY MOUTH EVERY DAY.   aspirin 325 MG tablet Take 325 mg by mouth every evening.   carvedilol 12.5 MG tablet Commonly known as: COREG TAKE 1 TABLET BY MOUTH TWICE DAILY WITH MEALS.   losartan-hydrochlorothiazide 50-12.5 MG tablet Commonly known as: HYZAAR Take 1 tablet by mouth daily.   omeprazole 20 MG capsule Commonly known as: PRILOSEC Take 20 mg by mouth daily with breakfast.   oxyCODONE-acetaminophen 7.5-325 MG tablet Commonly known as: PERCOCET Take 0.5 tablets by mouth every 8 (eight) hours as needed for severe pain.   rosuvastatin 10 MG tablet Commonly known as: CRESTOR Take 10 mg by mouth every evening.   ticagrelor 60 MG Tabs tablet Commonly known as: Brilinta Take 1 tablet (60 mg total) by mouth 2 (two) times daily.            Discharge Care Instructions  (From admission, onward)         Start     Ordered   08/03/19 0000  Discharge wound care:    Comments: Shower daily with soap and water starting 08/03/2019   08/03/19 0416           [facility] Discharge Instructions Carotid Endarterectomy (CEA)  Please refer to the following instructions for your post-procedure care. Your surgeon or physician assistant will discuss any changes with you.  Activity  You are encouraged to walk as much as you can. You can slowly return to normal activities but must avoid strenuous activity and heavy lifting until  your doctor tell you it's OK. Avoid activities such as vacuuming or swinging a golf club. You can drive after one week if you are comfortable and you are no longer taking prescription pain medications. It is normal to feel tired for serval weeks after your surgery. It is also normal to have difficulty with sleep habits, eating, and bowel movements after surgery. These will go away with time.  Bathing/Showering  You may shower after you come home. Do not soak in a bathtub,  hot tub, or swim until the incision heals completely.  Incision Care  Shower every day. Clean your incision with mild soap and water. Pat the area dry with a clean towel. You do not need a bandage unless otherwise instructed. Do not apply any ointments or creams to your incision. You may have skin glue on your incision. Do not peel it off. It will come off on its own in about one week. Your incision may feel thickened and raised for several weeks after your surgery. This is normal and the skin will soften over time. For Men Only: It's OK to shave around the incision but do not shave the incision itself for 2 weeks. It is common to have numbness under your chin that could last for several months.  Diet  Resume your normal diet. There are no special food restrictions following this procedure. A low fat/low cholesterol diet is recommended for all patients with vascular disease. In order to heal from your surgery, it is CRITICAL to get adequate nutrition. Your body requires vitamins, minerals, and protein. Vegetables are the best source of vitamins and minerals. Vegetables also provide the perfect balance of protein. Processed food has little nutritional value, so try to avoid this.  Medications  Resume taking all of your medications unless your doctor or physician assistant tells you not to.  If your incision is causing pain, you may take over-the- counter pain relievers such as acetaminophen (Tylenol). If you were prescribed a  stronger pain medication, please be aware these medications can cause nausea and constipation.  Prevent nausea by taking the medication with a snack or meal. Avoid constipation by drinking plenty of fluids and eating foods with a high amount of fiber, such as fruits, vegetables, and grains.  Do not take Tylenol if you are taking prescription pain medications.  Follow Up  Our office will schedule a follow up appointment 2-3 weeks following discharge.  Please call us immediately for any of the following conditions  . Increased pain, redness, drainage (pus) from your incision site. . Fever of 101 degrees or higher. . If you should develop stroke (slurred speech, difficulty swallowing, weakness on one side of your body, loss of vision) you should call 911 and go to the nearest emergency room. .  Reduce your risk of vascular disease:  . Stop smoking. If you would like help call [facility] at 1-800-QUIT-NOW 214-154-6594) or [place] at 7194769050. . Manage your cholesterol . Maintain a desired weight . Control your diabetes . Keep your blood pressure down .  If you have any questions, please call the office at [phone].  Prescriptions given: None given  Disposition: home  Patient's condition: is Good  Follow up: 1. Dr. Oneida Alar in 4 weeks with carotid duplex   Leontine Locket, PA-C [facility] [phone]   --- For [facility] Registry use ---   Modified Rankin score at D/C (0-6): 0  IV medication needed for:  1. Hypertension: No 2. Hypotension: No  Post-op Complications: No  1. Post-op CVA or TIA: No  If yes: Event classification (right eye, left eye, right cortical, left cortical, verterobasilar, other): n/a  If yes: Timing of event (intra-op, <6 hrs post-op, >=6 hrs post-op, unknown): n/a  2. CN injury: No  If yes: CN n/a injuried   3. Myocardial infarction: No  If yes: Dx by (EKG or clinical, Troponin): n/a  4.  CHF: No  5.   Dysrhythmia (new): No  6. Wound infection: No  7.  Reperfusion symptoms: No  8. Return to OR: No  If yes: return to OR for (bleeding, neurologic, other CEA incision, other): n/a  Discharge medications: Statin use:  Yes ASA use:  Yes   Beta blocker use:  Yes ACE-Inhibitor use:  No  ARB use:  Yes CCB use: Yes P2Y12 Antagonist use: Yes, [ ]  Plavix, [ ]  Plasugrel, [ ]  Ticlopinine, [ x] Ticagrelor, [ ]  Other, [ ]  No for medical reason, [ ]  Non-compliant, [ ]  Not-indicated Anti-coagulant use:  No, [ ]  Warfarin, [ ]  Rivaroxaban, [ ]  Dabigatran,

## 2019-08-03 NOTE — Progress Notes (Signed)
Mr and Mrs Kibby given discharge instructions.  Discussed follow up appointments and activites.  Discussed new medication and medication changes and side effects.  Discussed signs and symptoms to watch for and when to contact the physician.  Verbalized understanding.

## 2019-08-03 NOTE — Progress Notes (Signed)
CCMD called stating patient had a 7 beat run of V Tach. Patient currently lying in bed, watching tv. Will continue to monitor.   Tawanna Sat, RN 08/03/2019 7:26 AM

## 2019-08-03 NOTE — Progress Notes (Signed)
   VASCULAR SURGERY ASSESSMENT & PLAN:   1 Day Post-Op s/p: Left carotid stent.  Doing well.  Plan discharge today on Brilinta and aspirin.  He will follow-up with Dr. Oneida Alar.   SUBJECTIVE:   No complaints this morning.  PHYSICAL EXAM:   Vitals:   08/02/19 1358 08/02/19 1441 08/02/19 1606 08/03/19 0420  BP: 132/75 (!) 140/96  135/81  Pulse: 83 85 90 100  Resp:  (!) 21  17  Temp:  97.6 F (36.4 C)  97.8 F (36.6 C)  TempSrc:  Oral  Oral  SpO2: 99% 97%  96%  Weight:  92.9 kg    Height:  6\' 2"  (1.88 m)     Right groin looks fine. NEURO: No focal weakness.  LABS:   Lab Results  Component Value Date   WBC 12.4 (H) 08/03/2019   HGB 14.6 08/03/2019   HCT 43.5 08/03/2019   MCV 90.8 08/03/2019   PLT 155 08/03/2019   Lab Results  Component Value Date   CREATININE 1.46 (H) 08/03/2019   Lab Results  Component Value Date   INR 1.01 05/31/2012    PROBLEM LIST:    Active Problems:   Left carotid artery stenosis   CURRENT MEDS:   . amLODipine  10 mg Oral Daily  . aspirin  325 mg Oral QPM  . carvedilol  12.5 mg Oral BID WC  . docusate sodium  100 mg Oral Daily  . heparin  5,000 Units Subcutaneous Q8H  . losartan  50 mg Oral Daily   And  . hydrochlorothiazide  12.5 mg Oral Daily  . pantoprazole  40 mg Oral Daily  . rosuvastatin  10 mg Oral QPM  . sodium chloride flush  3 mL Intravenous Q12H  . ticagrelor  60 mg Oral BID    Deitra Mayo Beeper: [id number] Office: [phone] 08/03/2019

## 2019-08-03 NOTE — Plan of Care (Signed)
  Problem: Education: Goal: Knowledge of General Education information will improve Description: Including pain rating scale, medication(s)/side effects and non-pharmacologic comfort measures 08/03/2019 1136 by Glenard Haring, RN Outcome: Adequate for Discharge 08/03/2019 1135 by Glenard Haring, RN Outcome: Progressing

## 2019-08-05 MED FILL — Atropine Sulfate Soln Prefill Syr 1 MG/10ML (0.1 MG/ML): INTRAMUSCULAR | Qty: 10 | Status: AC

## 2019-08-05 MED FILL — Norepinephrine-Dextrose IV Solution 4 MG/250ML-5%: INTRAVENOUS | Qty: 250 | Status: AC

## 2019-08-09 ENCOUNTER — Other Ambulatory Visit: Payer: Self-pay

## 2019-08-09 DIAGNOSIS — I6523 Occlusion and stenosis of bilateral carotid arteries: Secondary | ICD-10-CM

## 2019-08-14 ENCOUNTER — Telehealth (HOSPITAL_COMMUNITY): Payer: Self-pay

## 2019-08-14 NOTE — Telephone Encounter (Signed)

## 2019-08-15 ENCOUNTER — Telehealth: Payer: Self-pay

## 2019-08-15 ENCOUNTER — Ambulatory Visit (HOSPITAL_COMMUNITY)
Admission: RE | Admit: 2019-08-15 | Discharge: 2019-08-15 | Disposition: A | Discharge status: Home / Self Care | Payer: Medicare Other | Source: Ambulatory Visit | Attending: Family | Admitting: Family

## 2019-08-15 ENCOUNTER — Encounter: Payer: Self-pay | Admitting: Family

## 2019-08-15 ENCOUNTER — Other Ambulatory Visit: Payer: Self-pay

## 2019-08-15 ENCOUNTER — Ambulatory Visit (INDEPENDENT_AMBULATORY_CARE_PROVIDER_SITE_OTHER): Payer: Self-pay | Admitting: Family

## 2019-08-15 VITALS — BP 115/76 | HR 78 | Temp 96.1°F | Resp 16 | Ht 74.0 in | Wt 205.0 lb

## 2019-08-15 DIAGNOSIS — I499 Cardiac arrhythmia, unspecified: Secondary | ICD-10-CM

## 2019-08-15 DIAGNOSIS — I6523 Occlusion and stenosis of bilateral carotid arteries: Secondary | ICD-10-CM | POA: Diagnosis present

## 2019-08-15 DIAGNOSIS — F17200 Nicotine dependence, unspecified, uncomplicated: Secondary | ICD-10-CM

## 2019-08-15 DIAGNOSIS — Z95828 Presence of other vascular implants and grafts: Secondary | ICD-10-CM

## 2019-08-15 DIAGNOSIS — Z9889 Other specified postprocedural states: Secondary | ICD-10-CM

## 2019-08-15 DIAGNOSIS — I779 Disorder of arteries and arterioles, unspecified: Secondary | ICD-10-CM

## 2019-08-15 DIAGNOSIS — I6522 Occlusion and stenosis of left carotid artery: Secondary | ICD-10-CM

## 2019-08-15 NOTE — Telephone Encounter (Signed)
Per Dr. Burt Knack, scheduled patient for OV 9/17 to discuss anticoagulation (per note from [place]). The patient was grateful for assistance and agrees with treatment plan.

## 2019-08-15 NOTE — Progress Notes (Signed)
Chief Complaint: Post operative follow up s/p left carotid angioplasty and stenting, Extracranial Carotid Artery Stenosis   History of Present Illness  [name] is a 73 y.o. male who is s/p left carotid angiogram, left carotid angioplasty and stenting with embolic protection on Q000111Q by Dr. 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 He returns today for post operative follow up.  He had a left CEA in 2007 followed by left carotid stent in 2011 by Dr. Oneida Alar.   He states that he had a stroke in about 2010 as manifested by garbled speech and lips feeling numb, he denies any residual neurological deficits, denies any subsequent neurological events.    He is s/p right SFA and external iliac stents, left SFA stent which is chronically occluded, left external iliac and left common iliac stents all placed by Dr. Albertine Patricia years ago in [place].  After walking about 100 feet, both hips and legs bother him, right worse than left. He states he has not been evaluated for arthritis in his hips. Note that he has had lumbar spine surgery x3, and that his right foot feels numb at times when he stands for long periods.    Pt denies non healing wounds in his feet/legs.  He hasbilateralknee problems which are also a barrier to walking; he had a left knee replacement about 2009 or 2011. He plays golf, rides in a cart.   His past medical history includes TIA about 2007that affected his speech only, no lateralizing sx's, no vision changes, states he was evaluated at [facility] at that time, denies any subsequent stroke or TIA. His medical problems include COPD, hypertension, and hypercholesterolemia; chronic kidney disease followed by Dr. Justin Mend. Serum creatinine result on file was 1.54 on 08-02-19, GFR was 44, stage 3b CKD, he states his next appointment is this or next month.  His cardiologist is Dr. Burt Knack.    Diabetic: No Tobacco use:  smoker (1 ppd, started in his late teens)  Pt meds include: Statin :Yes Betablocker: Yes ASA: Yes, 325 mg Other anticoagulants/antiplatelets: Brilinta   Past Medical History:  Diagnosis Date  . CKD (chronic kidney disease), stage III ([place])   . Dyspnea on exertion   . Edema   . HTN (hypertension)   . Hyperlipidemia   . Left carotid stenosis    a. 2007 s/p L CEA;  b. 2011 s/p L common carotid stenting 2/2 restenosis;  c. Q000111Q u/s: RICA [id number], LICA [id number], patent LCCA stent.  [name] PAD (peripheral artery disease) (HCC)    a. s/p prior RSFA, REIA, distal LSFA (known to be occluded), LEIA, and LCIA stenting;  b. 01/2015 ABI's: R 0.78, L 0.53.  [name] Stroke [facility])     Social History Social History   Tobacco Use  . Smoking status: Current Every Day Smoker    Packs/day: 1.00    Years: 50.00    Pack years: 50.00    Types: Cigarettes  . Smokeless tobacco: Never Used  Substance Use Topics  . Alcohol use: Yes    Alcohol/week: 7.0 standard drinks    Types: 7 Glasses of wine per week  . Drug use: No    Family History Family History  Problem Relation Age of Onset  . Hypertension Mother   . Hyperlipidemia Mother     Surgical History Past Surgical History:  Procedure Laterality Date  . ABDOMINAL AORTAGRAM N/A 02/05/2015   Procedure: ABDOMINAL Maxcine Ham;  Surgeon: Conrad Troy, MD;  Location: [facility] CATH  LAB;  Service: Cardiovascular;  Laterality: N/A;  . AORTIC ARCH ANGIOGRAPHY N/A 07/19/2019   Procedure: AORTIC ARCH ANGIOGRAPHY;  Surgeon: Elam Dutch, MD;  Location: [facility] CV LAB;  Service: Cardiovascular;  Laterality: N/A;  . CAROTID ANGIOGRAPHY N/A 07/19/2019   Procedure: CAROTID ANGIOGRAPHY;  Surgeon: Elam Dutch, MD;  Location: [place] LAB;  Service: Cardiovascular;  Laterality: N/A;  . carotid endarterecotomy    . CAROTID PTA/STENT INTERVENTION Left 08/02/2019   Procedure: CAROTID PTA/STENT INTERVENTION;  Surgeon: Elam Dutch, MD;  Location: [facility] CV  LAB;  Service: Cardiovascular;  Laterality: Left;  . external iliac     status post bilateral and left common iliac artery  . lumbar back surgery     x2  . LUMBAR DISC SURGERY     x 2  . stnt     lower extermity  . TENDON REPAIR     to the left arm  . TONSILLECTOMY      Allergies  Allergen Reactions  . Plavix [Clopidogrel] Rash    Current Outpatient Medications  Medication Sig Dispense Refill  . amLODipine (NORVASC) 10 MG tablet TAKE 1 TABLET BY MOUTH EVERY DAY. (Patient taking differently: Take 10 mg by mouth daily. ) 15 tablet 0  . aspirin 325 MG tablet Take 325 mg by mouth every evening.     . carvedilol (COREG) 12.5 MG tablet TAKE 1 TABLET BY MOUTH TWICE DAILY WITH MEALS. (Patient taking differently: Take 12.5 mg by mouth 2 (two) times daily with a meal. ) 30 tablet 0  . losartan-hydrochlorothiazide (HYZAAR) 50-12.5 MG per tablet Take 1 tablet by mouth daily.  11  . omeprazole (PRILOSEC) 20 MG capsule Take 20 mg by mouth daily with breakfast.     . oxyCODONE-acetaminophen (PERCOCET) 7.5-325 MG tablet Take 0.5 tablets by mouth every 8 (eight) hours as needed for severe pain.    . rosuvastatin (CRESTOR) 10 MG tablet Take 10 mg by mouth every evening.     . ticagrelor (BRILINTA) 60 MG TABS tablet Take 1 tablet (60 mg total) by mouth 2 (two) times daily. 60 tablet 11   No current facility-administered medications for this visit.     Review of Systems : See HPI for pertinent positives and negatives.  Physical Examination  Vitals:   08/15/19 0838 08/15/19 0841  BP: 109/72 115/76  Pulse: 78 78  Resp: 16   Temp: (!) 96.1 F (35.6 C)   TempSrc: Temporal   SpO2: 99%   Weight: 205 lb (93 kg)   Height: 6\' 2"  (1.88 m)    Body mass index is 26.32 kg/m.  General: WDWN male in NAD GAIT: normal Eyes: PERRLA HENT: No gross abnormalities.  Pulmonary:  Respirations are non-labored, adequate air movement in all fields, no rales, rhonchi, or wheezes Cardiac: irregular rhythm  with controlled rate, no detected murmur. Distant heart sounds  VASCULAR EXAM Carotid Bruits Right Left   Negative Negative     Abdominal aortic pulse is not palpable. Radial pulses are 2+ palpable and equal.  LE Pulses Right Left       FEMORAL  2+ palpable  2+ palpable        POPLITEAL  nott palpable   not palpable       POSTERIOR TIBIAL  not palpable   nit palpable        DORSALIS PEDIS      ANTERIOR TIBIAL not palpable  not palpable     Gastrointestinal: soft, nontender, BS WNL, no r/g, no palpable masses. Right groin angio puncture site is haling well, no signs of infection.  Musculoskeletal:no muscle atrophy/wasting. M/S 5/5 throughout, extremities without ischemic changes Skin: No rashes, no ulcers, no cellulitis.   Neurologic:  A&O X 3; appropriate affect, sensation is normal; speech is normal, CN 2-12 intact, pain and light touch intact in extremities, motor exam as listed above. Psychiatric: Normal thought content, mood appropriate to clinical situation.     DATA  Carotid Duplex (08-15-19): Summary: Right Carotid: Velocities in the right ICA are consistent with a 1-39% stenosis.                Non-hemodynamically significant plaque <50% noted in the CCA. The                ECA appears <50% stenosed. Left Carotid: There is no evidence of stenosis in the left ICA. Patent left               common carotid artery stent with no evidence of restenosis. Vertebrals:  Right vertebral artery demonstrates antegrade flow. Left vertebral              artery demonstrates high resistant flow. Subclavians: Normal flow hemodynamics were seen in bilateral subclavian arteries.    Bilateral Iliac Artery Stent Duplex (06-27-19): Abdominal Aorta Findings: +-------------+-------+----------+----------+----------+--------+--------+ Location AP (cm)Trans  (cm)PSV (cm/s)Waveform ThrombusComments +-------------+-------+----------+----------+----------+--------+--------+ Proximal 1.87 1.94 155     +-------------+-------+----------+----------+----------+--------+--------+ Distal   88     +-------------+-------+----------+----------+----------+--------+--------+ RT EIA Prox   129 triphasic    +-------------+-------+----------+----------+----------+--------+--------+ RT EIA Mid   209 monophasic   +-------------+-------+----------+----------+----------+--------+--------+ RT EIA Distal  245 monophasic   +-------------+-------+----------+----------+----------+--------+--------+ LT EIA Prox   167 monophasic   +-------------+-------+----------+----------+----------+--------+--------+ LT EIA Mid   117 biphasic    +-------------+-------+----------+----------+----------+--------+--------+ LT EIA Distal  117 biphasic    +-------------+-------+----------+----------+----------+--------+--------+ External iliac arteries are stented bilaterally.  Right Stent(s): +-------------------+--------+---------------+----------+--------+ common iliac arteryPSV cm/sStenosis Waveform Comments +-------------------+--------+---------------+----------+--------+ Prox to Stent 88  monophasic  +-------------------+--------+---------------+----------+--------+ Proximal Stent 90  biphasic   +-------------------+--------+---------------+----------+--------+ Mid Stent 65  biphasic    +-------------------+--------+---------------+----------+--------+ Distal Stent 109  biphasic   +-------------------+--------+---------------+----------+--------+ Distal to Stent 223 50-99% stenosismonophasic  +-------------------+--------+---------------+----------+--------+ Left Stent(s): +-------------------+--------+--------+----------+--------+ common iliac arteryPSV cm/sStenosisWaveform Comments +-------------------+--------+--------+----------+--------+ Prox to Stent 183  biphasic   +-------------------+--------+--------+----------+--------+ Proximal Stent 151  triphasic   +-------------------+--------+--------+----------+--------+ Mid Stent 152  monophasic  +-------------------+--------+--------+----------+--------+ Distal Stent 86  monophasic  +-------------------+--------+--------+----------+--------+ Distal to Stent 109  biphasic   +-------------------+--------+--------+----------+--------+  Summary: Abdominal Aorta: No evidence of an abdominal aortic aneurysm was visualized. Stenosis: +--------------------+---------------+ Location Stent  +--------------------+---------------+ Right Common Iliac 50-99% stenosis +--------------------+---------------+ Right External Iliac50-99% stenosis +--------------------+---------------+ No significant change compared to the exam on 09-10-18.   ABI (Date: 06/27/2019): ABI Findings: +---------+------------------+-----+----------+--------+ Right Rt Pressure (mmHg)IndexWaveform Comment  +---------+------------------+-----+----------+--------+ Brachial 136     +---------+------------------+-----+----------+--------+ ATA 77  0.57 monophasic  +---------+------------------+-----+----------+--------+ PTA 68 0.50 monophasic  +---------+------------------+-----+----------+--------+ Great Toe62 0.46    +---------+------------------+-----+----------+--------+  +---------+------------------+-----+--------+-------+ Left Lt Pressure (mmHg)IndexWaveformComment +---------+------------------+-----+--------+-------+ Brachial 133     +---------+------------------+-----+--------+-------+ ATA 97 0.71 biphasic  +---------+------------------+-----+--------+-------+  PTA 74 0.54 biphasic  +---------+------------------+-----+--------+-------+ Great Toe74 0.54    +---------+------------------+-----+--------+-------+  +-------+-----------+-----------+------------+------------+ ABI/TBIToday's ABIToday's TBIPrevious ABIPrevious TBI +-------+-----------+-----------+------------+------------+ Right 0.57 0.46 0.59 0.37  +-------+-----------+-----------+------------+------------+ Left 0.71 0.54 0.50 0.38  +-------+-----------+-----------+------------+------------+  Right ABIs appear essentially unchanged compared to prior study on 09/10/2018. Left ABIs appear increased compared to prior study on 09/10/2018.  Summary: Right: Resting right ankle-brachial index indicates moderate right lower extremity arterial disease. The right toe-brachial index is abnormal. RT great toe pressure = 62 mmHg.  Left: Resting left ankle-brachial index indicates moderate left lower extremity arterial disease. The left toe-brachial index is abnormal. LT Great toe pressure = 74 mmHg.    Assessment: [name] is a 73 y.o. male who is s/p  left carotid angiogram, left carotid angioplasty and stenting with embolic protection on Q000111Q by Dr. 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  Prior to the above he was s/p left carotid endarterectomy on 06/22/2006, left common carotid artery stent on 09/10/2010, right SFA and EIA stents placed years ago, and left SFA stent placed years ago with subsequent occlusion.   He had a TIA about 2007, states he was evaluated at [facility] at that time, denies any subsequent stroke or TIA.  He may have radiculopathy sx's and claudication sx's in both LE's. He has a hx of lumbar spine surgery x3.  Fortunately he does not have DM but unfortunately he continues to smoke a ppd.  At pt's 02/18/15 visit Dr. Oneida Alar assessment/plan mentions that if pt has further embolic events, would need to consider adding an additional antiplatelet agent. The patient has had Plavix in the past but had an allergic reaction with a rash to this. We might need to consider Brilinta if he has another atheroembolic event, and he is currently taking Brilinta.   Pt states that atrial fib was discovered at his 08-02-19 hospitalization. He denies chest pain or dyspnea, denies feeling light headed. He states he missed a couple appointments with Dr. Burt Knack, cardiologist. I advised he and his wife to call Dr. Antionette Char office today to make an appointment for ASAP.    Over 3 minutes was spent counseling patient re smoking cessation, and patient was given several free resources re smoking cessation.    Plan: Follow-up in 6 months with Carotid Duplex scan and ABI's.    I discussed in depth with the patient the nature of atherosclerosis, and emphasized the importance of maximal medical management including strict control of blood pressure, blood glucose, and lipid levels, obtaining regular exercise, and cessation of smoking.  The patient is aware that without maximal medical management the  underlying atherosclerotic disease process will progress, limiting the benefit of any interventions. The patient was given information about stroke prevention and what symptoms should prompt the patient to seek immediate medical care. Thank you for allowing [place] to participate in this patient's care.  Clemon Chambers, RN, MSN, FNP-C [facility] Office: [phone]  Clinic Physician: Oneida Alar  08/15/19 8:51 AM

## 2019-08-15 NOTE — Patient Instructions (Signed)
Steps to Quit Smoking Smoking tobacco is the leading cause of preventable death. It can affect almost every organ in the body. Smoking puts you and people around you at risk for many serious, long-lasting (chronic) diseases. Quitting smoking can be hard, but it is one of the best things that you can do for your health. It is never too late to quit. How do I get ready to quit? When you decide to quit smoking, make a plan to help you succeed. Before you quit:  Pick a date to quit. Set a date within the next 2 weeks to give you time to prepare.  Write down the reasons why you are quitting. Keep this list in places where you will see it often.  Tell your family, friends, and co-workers that you are quitting. Their support is important.  Talk with your doctor about the choices that may help you quit.  Find out if your health insurance will pay for these treatments.  Know the people, places, things, and activities that make you want to smoke (triggers). Avoid them. What first steps can I take to quit smoking?  Throw away all cigarettes at home, at work, and in your car.  Throw away the things that you use when you smoke, such as ashtrays and lighters.  Clean your car. Make sure to empty the ashtray.  Clean your home, including curtains and carpets. What can I do to help me quit smoking? Talk with your doctor about taking medicines and seeing a counselor at the same time. You are more likely to succeed when you do both.  If you are pregnant or breastfeeding, talk with your doctor about counseling or other ways to quit smoking. Do not take medicine to help you quit smoking unless your doctor tells you to do so. To quit smoking: Quit right away  Quit smoking totally, instead of slowly cutting back on how much you smoke over a period of time.  Go to counseling. You are more likely to quit if you go to counseling sessions regularly. Take medicine You may take medicines to help you quit. Some  medicines need a prescription, and some you can buy over-the-counter. Some medicines may contain a drug called nicotine to replace the nicotine in cigarettes. Medicines may:  Help you to stop having the desire to smoke (cravings).  Help to stop the problems that come when you stop smoking (withdrawal symptoms). Your doctor may ask you to use:  Nicotine patches, gum, or lozenges.  Nicotine inhalers or sprays.  Non-nicotine medicine that is taken by mouth. Find resources Find resources and other ways to help you quit smoking and remain smoke-free after you quit. These resources are most helpful when you use them often. They include:  Online chats with a counselor.  Phone quitlines.  Printed self-help materials.  Support groups or group counseling.  Text messaging programs.  Mobile phone apps. Use apps on your mobile phone or tablet that can help you stick to your quit plan. There are many free apps for mobile phones and tablets as well as websites. Examples include Quit Guide from the CDC and smokefree.gov  What things can I do to make it easier to quit?   Talk to your family and friends. Ask them to support and encourage you.  Call a phone quitline (1-800-QUIT-NOW), reach out to support groups, or work with a counselor.  Ask people who smoke to not smoke around you.  Avoid places that make you want to smoke,   such as: ? Bars. ? Parties. ? Smoke-break areas at work.  Spend time with people who do not smoke.  Lower the stress in your life. Stress can make you want to smoke. Try these things to help your stress: ? Getting regular exercise. ? Doing deep-breathing exercises. ? Doing yoga. ? Meditating. ? Doing a body scan. To do this, close your eyes, focus on one area of your body at a time from head to toe. Notice which parts of your body are tense. Try to relax the muscles in those areas. How will I feel when I quit smoking? Day 1 to 3 weeks Within the first 24 hours,  you may start to have some problems that come from quitting tobacco. These problems are very bad 2-3 days after you quit, but they do not often last for more than 2-3 weeks. You may get these symptoms:  Mood swings.  Feeling restless, nervous, angry, or annoyed.  Trouble concentrating.  Dizziness.  Strong desire for high-sugar foods and nicotine.  Weight gain.  Trouble pooping (constipation).  Feeling like you may vomit (nausea).  Coughing or a sore throat.  Changes in how the medicines that you take for other issues work in your body.  Depression.  Trouble sleeping (insomnia). Week 3 and afterward After the first 2-3 weeks of quitting, you may start to notice more positive results, such as:  Better sense of smell and taste.  Less coughing and sore throat.  Slower heart rate.  Lower blood pressure.  Clearer skin.  Better breathing.  Fewer sick days. Quitting smoking can be hard. Do not give up if you fail the first time. Some people need to try a few times before they succeed. Do your best to stick to your quit plan, and talk with your doctor if you have any questions or concerns. Summary  Smoking tobacco is the leading cause of preventable death. Quitting smoking can be hard, but it is one of the best things that you can do for your health.  When you decide to quit smoking, make a plan to help you succeed.  Quit smoking right away, not slowly over a period of time.  When you start quitting, seek help from your doctor, family, or friends. This information is not intended to replace advice given to you by your health care provider. Make sure you discuss any questions you have with your health care provider. Document Released: 09/17/2009 Document Revised: 02/08/2019 Document Reviewed: 02/09/2019 Elsevier Patient Education  2020 Elsevier Inc.     Stroke Prevention Some medical conditions and lifestyle choices can lead to a higher risk for a stroke. You can  help to prevent a stroke by making nutrition, lifestyle, and other changes. What nutrition changes can be made?   Eat healthy foods. ? Choose foods that are high in fiber. These include:  Fresh fruits.  Fresh vegetables.  Whole grains. ? Eat at least 5 or more servings of fruits and vegetables each day. Try to fill half of your plate at each meal with fruits and vegetables. ? Choose lean protein foods. These include:  Lowfat (lean) cuts of meat.  Chicken without skin.  Fish.  Tofu.  Beans.  Nuts. ? Eat low-fat dairy products. ? Avoid foods that:  Are high in salt (sodium).  Have saturated fat.  Have trans fat.  Have cholesterol.  Are processed.  Are premade.  Follow eating guidelines as told by your doctor. These may include: ? Reducing how many calories you   eat and drink each day. ? Limiting how much salt you eat or drink each day to 1,500 milligrams (mg). ? Using only healthy fats for cooking. These include:  Olive oil.  Canola oil.  Sunflower oil. ? Counting how many carbohydrates you eat and drink each day. What lifestyle changes can be made?  Try to stay at a healthy weight. Talk to your doctor about what a good weight is for you.  Get at least 30 minutes of moderate physical activity at least 5 days a week. This can include: ? Fast walking. ? Biking. ? Swimming.  Do not use any products that have nicotine or tobacco. This includes cigarettes and e-cigarettes. If you need help quitting, ask your doctor. Avoid being around tobacco smoke in general.  Limit how much alcohol you drink to no more than 1 drink a day for nonpregnant women and 2 drinks a day for men. One drink equals 12 oz of beer, 5 oz of wine, or 1 oz of hard liquor.  Do not use drugs.  Avoid taking birth control pills. Talk to your doctor about the risks of taking birth control pills if: ? You are over 35 years old. ? You smoke. ? You get migraines. ? You have had a blood clot.  What other changes can be made?  Manage your cholesterol. ? It is important to eat a healthy diet. ? If your cholesterol cannot be managed through your diet, you may also need to take medicines. Take medicines as told by your doctor.  Manage your diabetes. ? It is important to eat a healthy diet and to exercise regularly. ? If your blood sugar cannot be managed through diet and exercise, you may need to take medicines. Take medicines as told by your doctor.  Control your high blood pressure (hypertension). ? Try to keep your blood pressure below 130/80. This can help lower your risk of stroke. ? It is important to eat a healthy diet and to exercise regularly. ? If your blood pressure cannot be managed through diet and exercise, you may need to take medicines. Take medicines as told by your doctor. ? Ask your doctor if you should check your blood pressure at home. ? Have your blood pressure checked every year. Do this even if your blood pressure is normal.  Talk to your doctor about getting checked for a sleep disorder. Signs of this can include: ? Snoring a lot. ? Feeling very tired.  Take over-the-counter and prescription medicines only as told by your doctor. These may include aspirin or blood thinners (antiplatelets or anticoagulants).  Make sure that any other medical conditions you have are managed. Where to find more information  American Stroke Association: www.strokeassociation.org  National Stroke Association: www.stroke.org Get help right away if:  You have any symptoms of stroke. "BE FAST" is an easy way to remember the main warning signs: ? B - Balance. Signs are dizziness, sudden trouble walking, or loss of balance. ? E - Eyes. Signs are trouble seeing or a sudden change in how you see. ? F - Face. Signs are sudden weakness or loss of feeling of the face, or the face or eyelid drooping on one side. ? A - Arms. Signs are weakness or loss of feeling in an arm. This  happens suddenly and usually on one side of the body. ? S - Speech. Signs are sudden trouble speaking, slurred speech, or trouble understanding what people say. ? T - Time. Time to call emergency   services. Write down what time symptoms started.  You have other signs of stroke, such as: ? A sudden, very bad headache with no known cause. ? Feeling sick to your stomach (nausea). ? Throwing up (vomiting). ? Jerky movements you cannot control (seizure). These symptoms may represent a serious problem that is an emergency. Do not wait to see if the symptoms will go away. Get medical help right away. Call your local emergency services (911 in the [place] Do not drive yourself to the hospital. Summary  You can prevent a stroke by eating healthy, exercising, not smoking, drinking less alcohol, and treating other health problems, such as diabetes, high blood pressure, or high cholesterol.  Do not use any products that contain nicotine or tobacco, such as cigarettes and e-cigarettes.  Get help right away if you have any signs or symptoms of a stroke. This information is not intended to replace advice given to you by your health care provider. Make sure you discuss any questions you have with your health care provider. Document Released: 05/22/2012 Document Revised: 01/17/2019 Document Reviewed: 02/22/2017 Elsevier Patient Education  2020 Elsevier Inc.     Peripheral Vascular Disease  Peripheral vascular disease (PVD) is a disease of the blood vessels that are not part of your heart and brain. A simple term for PVD is poor circulation. In most cases, PVD narrows the blood vessels that carry blood from your heart to the rest of your body. This can reduce the supply of blood to your arms, legs, and internal organs, like your stomach or kidneys. However, PVD most often affects a person's lower legs and feet. Without treatment, PVD tends to get worse. PVD can also lead to acute ischemic limb. This is when  an arm or leg suddenly cannot get enough blood. This is a medical emergency. Follow these instructions at home: Lifestyle  Do not use any products that contain nicotine or tobacco, such as cigarettes and e-cigarettes. If you need help quitting, ask your doctor.  Lose weight if you are overweight. Or, stay at a healthy weight as told by your doctor.  Eat a diet that is low in fat and cholesterol. If you need help, ask your doctor.  Exercise regularly. Ask your doctor for activities that are right for you. General instructions  Take over-the-counter and prescription medicines only as told by your doctor.  Take good care of your feet: ? Wear comfortable shoes that fit well. ? Check your feet often for any cuts or sores.  Keep all follow-up visits as told by your doctor This is important. Contact a doctor if:  You have cramps in your legs when you walk.  You have leg pain when you are at rest.  You have coldness in a leg or foot.  Your skin changes.  You are unable to get or have an erection (erectile dysfunction).  You have cuts or sores on your feet that do not heal. Get help right away if:  Your arm or leg turns cold, numb, and blue.  Your arms or legs become red, warm, swollen, painful, or numb.  You have chest pain.  You have trouble breathing.  You suddenly have weakness in your face, arm, or leg.  You become very confused or you cannot speak.  You suddenly have a very bad headache.  You suddenly cannot see. Summary  Peripheral vascular disease (PVD) is a disease of the blood vessels.  A simple term for PVD is poor circulation. Without treatment, PVD tends   to get worse.  Treatment may include exercise, low fat and low cholesterol diet, and quitting smoking. This information is not intended to replace advice given to you by your health care provider. Make sure you discuss any questions you have with your health care provider. Document Released: 02/15/2010  Document Revised: 11/03/2017 Document Reviewed: 12/29/2016 Elsevier Patient Education  2020 Elsevier Inc.  

## 2019-08-22 ENCOUNTER — Ambulatory Visit (INDEPENDENT_AMBULATORY_CARE_PROVIDER_SITE_OTHER): Payer: Medicare Other | Admitting: Cardiovascular Disease

## 2019-08-22 ENCOUNTER — Other Ambulatory Visit: Payer: Self-pay

## 2019-08-22 ENCOUNTER — Encounter: Payer: Self-pay | Admitting: Cardiovascular Disease

## 2019-08-22 VITALS — BP 124/70 | HR 65 | Ht 74.0 in | Wt 211.0 lb

## 2019-08-22 DIAGNOSIS — I4819 Other persistent atrial fibrillation: Secondary | ICD-10-CM | POA: Diagnosis not present

## 2019-08-22 DIAGNOSIS — I10 Essential (primary) hypertension: Secondary | ICD-10-CM

## 2019-08-22 DIAGNOSIS — E782 Mixed hyperlipidemia: Secondary | ICD-10-CM

## 2019-08-22 DIAGNOSIS — I779 Disorder of arteries and arterioles, unspecified: Secondary | ICD-10-CM | POA: Diagnosis not present

## 2019-08-22 MED ORDER — APIXABAN 5 MG PO TABS: 5.0000 mg | ORAL_TABLET | Freq: Two times a day (BID) | ORAL | 3 refills | Status: DC

## 2019-08-22 MED ORDER — ASPIRIN EC 81 MG PO TBEC: 81.0000 mg | DELAYED_RELEASE_TABLET | Freq: Every day | ORAL | 3 refills | Status: DC

## 2019-08-22 NOTE — Patient Instructions (Addendum)
Medication Instructions:  1) STOP BRILINTA 2) DECREASE ASPIRIN to 81 mg daily 3) START ELIQUIS 5 mg TWICE DAILY  Labwork: None  Testing/Procedures: Your provider has requested that you have an echocardiogram. Echocardiography is a painless test that uses sound waves to create images of your heart. It provides your doctor with information about the size and shape of your heart and how well your heart's chambers and valves are working. This procedure takes approximately one hour. There are no restrictions for this procedure.    Dr. Burt Knack recommends you wear a heart monitor for 3 days. This will be mailed to you.   Follow-Up: You have a follow-up appointment with Dr. Antionette Char assistant, Nicki Reaper, on 10/20 at 9:15AM.

## 2019-08-22 NOTE — Progress Notes (Signed)
Cardiology Office Note:    Date:  08/22/2019   ID:  DEWY KOLODZIEJ, DOB 06-Aug-1946, MRN [id number]  PCP:  Raelene Bott, MD  Cardiologist:  No primary care provider on file.  Electrophysiologist:  None   Referring MD: Raelene Bott, MD   Chief Complaint  Patient presents with   Atrial Fibrillation    History of Present Illness:    [name] is a 73 y.o. male with a hx of peripheral arterial disease, presenting for follow-up evaluation.  I have not seen the patient since 2013.  He has been followed by Dr. Oneida Alar as his problems have primarily been related to peripheral arterial disease and carotid artery stenosis.  He does have a history of hypertension and mixed hyperlipidemia.  He has not had recent problems with claudication.  The patient underwent recent angioplasty and stenting of in-stent restenosis of the left internal carotid artery.  At the time he was noted to have atrial fibrillation and he is referred today for further management.  The patient complains of exertional dyspnea, this is unchanged over a long time.  He is a longstanding smoker and current every day smoker.  He has no orthopnea or PND.  He has no chest pain.  He has no heart palpitations.  He carries no previous diagnosis of atrial fibrillation.  He has no history of bleeding or blood transfusion.  The patient is followed for chronic kidney disease and this is been stable over time.  He has had a stroke in the past.  Past Medical History:  Diagnosis Date   CKD (chronic kidney disease), stage III (HCC)    Dyspnea on exertion    Edema    HTN (hypertension)    Hyperlipidemia    Left carotid stenosis    a. 2007 s/p L CEA;  b. 2011 s/p L common carotid stenting 2/2 restenosis;  c. [id number] u/s: RICA [id number], LICA [id number], patent LCCA stent.   PAD (peripheral artery disease) (HCC)    a. s/p prior RSFA, REIA, distal LSFA (known to be occluded), LEIA, and LCIA stenting;  b. 01/2015 ABI's: R 0.78, L 0.53.    Stroke [facility]     Past Surgical History:  Procedure Laterality Date   ABDOMINAL AORTAGRAM N/A 02/05/2015   Procedure: ABDOMINAL AORTAGRAM;  Surgeon: Conrad [place], MD;  Location: [facility] CATH LAB;  Service: Cardiovascular;  Laterality: N/A;   AORTIC ARCH ANGIOGRAPHY N/A 07/19/2019   Procedure: AORTIC ARCH ANGIOGRAPHY;  Surgeon: Elam Dutch, MD;  Location: [facility] CV LAB;  Service: Cardiovascular;  Laterality: N/A;   CAROTID ANGIOGRAPHY N/A 07/19/2019   Procedure: CAROTID ANGIOGRAPHY;  Surgeon: Elam Dutch, MD;  Location: [facility] CV LAB;  Service: Cardiovascular;  Laterality: N/A;   carotid endarterecotomy     CAROTID PTA/STENT INTERVENTION Left 08/02/2019   Procedure: CAROTID PTA/STENT INTERVENTION;  Surgeon: Elam Dutch, MD;  Location: [facility] CV LAB;  Service: Cardiovascular;  Laterality: Left;   external iliac     status post bilateral and left common iliac artery   lumbar back surgery     x2   LUMBAR DISC SURGERY     x 2   stnt     lower extermity   TENDON REPAIR     to the left arm   TONSILLECTOMY      Current Medications: Current Meds  Medication Sig   amLODipine (NORVASC) 10 MG tablet TAKE 1 TABLET BY MOUTH EVERY DAY.   carvedilol (COREG) 12.5 MG  tablet TAKE 1 TABLET BY MOUTH TWICE DAILY WITH MEALS.   losartan-hydrochlorothiazide (HYZAAR) 50-12.5 MG per tablet Take 1 tablet by mouth daily.   omeprazole (PRILOSEC) 20 MG capsule Take 20 mg by mouth daily with breakfast.    oxyCODONE-acetaminophen (PERCOCET) 7.5-325 MG tablet Take 0.5 tablets by mouth every 8 (eight) hours as needed for severe pain.   rosuvastatin (CRESTOR) 10 MG tablet Take 10 mg by mouth every evening.    TRELEGY ELLIPTA 100-62.5-25 MCG/INH AEPB Inhale 1 puff into the lungs daily.   [DISCONTINUED] aspirin 325 MG tablet Take 325 mg by mouth every evening.    [DISCONTINUED] ticagrelor (BRILINTA) 60 MG TABS tablet Take 1 tablet (60 mg total) by mouth 2 (two) times daily.       Allergies:   Plavix [clopidogrel]   Social History   Socioeconomic History   Marital status: Married    Spouse name: Not on file   Number of children: Not on file   Years of education: Not on file   Highest education level: Not on file  Occupational History   Not on file  Social Needs   Financial resource strain: Not on file   Food insecurity    Worry: Not on file    Inability: Not on file   Transportation needs    Medical: Not on file    Non-medical: Not on file  Tobacco Use   Smoking status: Current Every Day Smoker    Packs/day: 1.00    Years: 50.00    Pack years: 50.00    Types: Cigarettes   Smokeless tobacco: Never Used  Substance and Sexual Activity   Alcohol use: Yes    Alcohol/week: 7.0 standard drinks    Types: 7 Glasses of wine per week   Drug use: No   Sexual activity: Not on file  Lifestyle   Physical activity    Days per week: Not on file    Minutes per session: Not on file   Stress: Not on file  Relationships   Social connections    Talks on phone: Not on file    Gets together: Not on file    Attends religious service: Not on file    Active member of club or organization: Not on file    Attends meetings of clubs or organizations: Not on file    Relationship status: Not on file  Other Topics Concern   Not on file  Social History Narrative   Not on file     Family History: The patient's family history includes Hyperlipidemia in his mother; Hypertension in his mother.  ROS:   Please see the history of present illness.    All other systems reviewed and are negative.  EKGs/Labs/Other Studies Reviewed:    The following studies were reviewed today: Nuclear stress test 02/04/2015: Overall Impression: Low risk stress nuclear study with small-sized, mild intensity fixed apical perfusion defect (which is actually worse at rest than stress), likely attenuation artifact.   LV Ejection Fraction: 60%. LV Wall Motion: Normal  Wall Motion  EKG:  EKG is not ordered today.  The ekg from 07/19/2019 ordered today demonstrates atrial fibrillation 83 bpm, otherwise within normal limits.  Recent Labs: 08/03/2019: BUN 22; Creatinine, Ser 1.46; Hemoglobin 14.6; Platelets 155; Potassium 3.6; Sodium 137  Recent Lipid Panel    Component Value Date/Time   CHOL 194 03/13/2012 0836   TRIG 341.0 (H) 03/13/2012 0836   HDL 35.90 (L) 03/13/2012 0836   CHOLHDL 5 03/13/2012 [id number]  VLDL 68.2 (H) 03/13/2012 0836   LDLDIRECT 98.5 03/13/2012 0836    Physical Exam:    VS:  BP 124/70    Pulse 65    Ht 6\' 2"  (1.88 m)    Wt 211 lb (95.7 kg)    SpO2 98%    BMI 27.09 kg/m     Wt Readings from Last 3 Encounters:  08/22/19 211 lb (95.7 kg)  08/15/19 205 lb (93 kg)  08/02/19 204 lb 12.9 oz (92.9 kg)     GEN:  Well nourished, well developed in no acute distress HEENT: Normal NECK: No JVD; Normal carotid upstrokes LYMPHATICS: No lymphadenopathy CARDIAC: irregularly irregular, no murmurs, rubs, gallops RESPIRATORY:  Clear to auscultation without rales, wheezing or rhonchi  ABDOMEN: Soft, non-tender, non-distended MUSCULOSKELETAL:  No edema; No deformity  SKIN: Warm and dry NEUROLOGIC:  Alert and oriented x 3 PSYCHIATRIC:  Normal affect   ASSESSMENT:    1. Persistent atrial fibrillation   2. Essential hypertension   3. Mixed hyperlipidemia    PLAN:    In order of problems listed above:  1. CHADS2-Vasc = 5 (age 27, HTN, PAD, prior stoke 2). Anticoagulation is indicated for prevention of stroke and cardioembolic events. I discussed with Dr Oneida Alar since the patient is 3 weeks out from carotid intervention. He is ok with [place] converting him to an oral anticoagulant drug and stopping ticagrelor. The patient will continue on ASA 81 mg daily and start apixaban 5 mg BID. Will check an echocardiogram to evaluate LV function, valvular disease, and assess LA size. Will check a 3 day monitor to assess heart rate control. We discussed rate  control versus rhythm control strategies and will make a determination based on findings from his studies. We don't know how long he has been in atrial fibrillation. 2. BP controlled on current Rx.  3. Treated with a statin drug (rosuvasatin 10 mg).  4. PAD/carotid stenosis: per Dr Oneida Alar, appears to be doing well after recent carotid intervention.   Medication Adjustments/Labs and Tests Ordered: Current medicines are reviewed at length with the patient today.  Concerns regarding medicines are outlined above.  Orders Placed This Encounter  Procedures   LONG TERM MONITOR (3-14 DAYS)   ECHOCARDIOGRAM COMPLETE   Meds ordered this encounter  Medications   aspirin EC 81 MG tablet    Sig: Take 1 tablet (81 mg total) by mouth daily.    Dispense:  90 tablet    Refill:  3   apixaban (ELIQUIS) 5 MG TABS tablet    Sig: Take 1 tablet (5 mg total) by mouth 2 (two) times daily.    Dispense:  180 tablet    Refill:  3    Patient Instructions  Medication Instructions:  1) STOP BRILINTA 2) DECREASE ASPIRIN to 81 mg daily 3) START ELIQUIS 5 mg TWICE DAILY  Labwork: None  Testing/Procedures: Your provider has requested that you have an echocardiogram. Echocardiography is a painless test that uses sound waves to create images of your heart. It provides your doctor with information about the size and shape of your heart and how well your hearts chambers and valves are working. This procedure takes approximately one hour. There are no restrictions for this procedure.    Dr. Burt Knack recommends you wear a heart monitor for 3 days. This will be mailed to you.   Follow-Up: You have a follow-up appointment with Dr. Antionette Char assistant, Nicki Reaper, on 10/20 at 9:15AM.    Signed, Sherren Mocha, MD  08/22/2019  1:54 PM    [facility]

## 2019-08-27 ENCOUNTER — Ambulatory Visit (HOSPITAL_COMMUNITY): Payer: Medicare Other | Attending: Cardiology

## 2019-08-27 ENCOUNTER — Other Ambulatory Visit: Payer: Self-pay

## 2019-08-27 DIAGNOSIS — I4819 Other persistent atrial fibrillation: Secondary | ICD-10-CM | POA: Diagnosis present

## 2019-08-28 ENCOUNTER — Telehealth: Payer: Self-pay | Admitting: *Deleted

## 2019-08-28 NOTE — Telephone Encounter (Signed)
3 day ZIO XT long term holter monitor to be mailed to the patients home.  Instructions reviewed briefly as they are included in the monitor kit. 

## 2019-08-30 ENCOUNTER — Ambulatory Visit (INDEPENDENT_AMBULATORY_CARE_PROVIDER_SITE_OTHER): Payer: Medicare Other

## 2019-08-30 DIAGNOSIS — I4819 Other persistent atrial fibrillation: Secondary | ICD-10-CM

## 2019-09-23 NOTE — Progress Notes (Signed)
Cardiology Office Note:    Date:  09/24/2019   ID:  [name], DOB December 09, 1945, MRN [id number]  PCP:  [name] Bott, MD  Cardiologist:  [name] Mocha, MD  Electrophysiologist:  None   Referring MD: [name] Bott, MD   Chief Complaint  Patient presents with  . Follow-up    AFib    History of Present Illness:    [name] is a 73 y.o. male with:   Atrial fibrillation   CHADS2-VASc=5 (age x 1, vascular dz, HTN, CVA) >> Apixaban   PAD  Carotid artery disease  S/p L CEA 2007  S/p L CCA stenting in 2011 2/2 restenosis  S/p L ICA stenting 07/2019 2/2 restenosis   Hx of CVA   Hypertension   Hyperlipidemia   Chronic kidney disease 3  COPD  Mr. [name] has been followed for vascular disease by Dr. Oneida [name] and recently underwent stenting to the L ICA due to restenosis in 07/2019.  He was noted to be in atrial fibrillation and was referred back to Dr. Burt [name].  He was seen in 08/2019 and was taken off of Ticagrelor, kept on ASA and placed on Apixaban.  An echocardiogram was obtained and demonstrated normal LV function and mod LAE.  A 3 day Zio patch demonstrated an average HR of 93.    He returns for follow-up.  He is here alone.  He has not had chest discomfort, syncope.  He does note shortness of breath with activity.  He also notes fatigue.  He recently had some right knee pain which was followed by right ankle pain.  His ankle pain was exacerbated with just light touch.  He now has swelling in that ankle.  Of note, he has COPD.  He uses Trelegy.  It is clear that this has helped his dyspnea with exertion.  Prior CV studies:   The following studies were reviewed today:  3 day Zio Monitor 09/2019 Avg HR 93 (max HR 178) 2 episodes of NSVT (longest 4 beats)  Echocardiogram 08/27/2019 EF 55-60, mild LVH, mod LAE, mild MAC, trace MR, mod TR, RVSP 27.4  Carotid US 08/15/2019 Summary: Right Carotid: Velocities in the right ICA are consistent with a 1-39% stenosis.                 Non-hemodynamically significant plaque <50% noted in the CCA. The                ECA appears <50% stenosed. Left Carotid: There is no evidence of stenosis in the left ICA. Patent left               common carotid artery stent with no evidence of restenosis. Vertebrals:  Right vertebral artery demonstrates antegrade flow. Left vertebral              artery demonstrates high resistant flow. Subclavians: Normal flow hemodynamics were seen in bilateral subclavian              arteries.  Myoview 02/04/15 Low risk stress nuclear study with small-sized, mild intensity fixed apical perfusion defect (which is actually worse at rest than stress), likely attenuation artifact.  LV Ejection Fraction: 60%.    Past Medical History:  Diagnosis Date  . CKD (chronic kidney disease), stage III   . Dyspnea on exertion   . Edema   . HTN (hypertension)   . Hyperlipidemia   . Left carotid stenosis    a. 2007 s/p L CEA;  b. 2011 s/p L  common carotid stenting 2/2 restenosis;  c. 07/4165 u/s: RICA <06, LICA 30-16%, patent LCCA stent.  [name] PAD (peripheral artery disease) (HCC)    a. s/p prior RSFA, REIA, distal LSFA (known to be occluded), LEIA, and LCIA stenting;  b. 01/2015 ABI's: R 0.78, L 0.53.  [name] Stroke [facility])    Surgical Hx: The patient  has a past surgical history that includes Lumbar disc surgery; carotid endarterecotomy; stnt; external iliac; lumbar back surgery; Tonsillectomy; Tendon repair; abdominal aortagram (N/A, 02/05/2015); AORTIC ARCH ANGIOGRAPHY (N/A, 07/19/2019); CAROTID ANGIOGRAPHY (N/A, 07/19/2019); and CAROTID PTA/STENT INTERVENTION (Left, 08/02/2019).   Current Medications: Current Meds  Medication Sig  . apixaban (ELIQUIS) 5 MG TABS tablet Take 1 tablet (5 mg total) by mouth 2 (two) times daily.  [name] aspirin EC 81 MG tablet Take 1 tablet (81 mg total) by mouth daily.  [name] losartan-hydrochlorothiazide (HYZAAR) 50-12.5 MG per tablet Take 1 tablet by mouth daily.  [name] omeprazole  (PRILOSEC) 20 MG capsule Take 20 mg by mouth daily with breakfast.   . oxyCODONE-acetaminophen (PERCOCET) 7.5-325 MG tablet Take 0.5 tablets by mouth every 8 (eight) hours as needed for severe pain.  . rosuvastatin (CRESTOR) 10 MG tablet Take 10 mg by mouth every evening.   . TRELEGY ELLIPTA 100-62.5-25 MCG/INH AEPB Inhale 1 puff into the lungs daily.  . [DISCONTINUED] amLODipine (NORVASC) 10 MG tablet TAKE 1 TABLET BY MOUTH EVERY DAY.  . [DISCONTINUED] carvedilol (COREG) 12.5 MG tablet TAKE 1 TABLET BY MOUTH TWICE DAILY WITH MEALS.     Allergies:   Plavix [clopidogrel]   Social History   Tobacco Use  . Smoking status: Current Every Day Smoker    Packs/day: 1.00    Years: 50.00    Pack years: 50.00    Types: Cigarettes  . Smokeless tobacco: Never Used  Substance Use Topics  . Alcohol use: Yes    Alcohol/week: 7.0 standard drinks    Types: 7 Glasses of wine per week  . Drug use: No     Family Hx: The patient's family history includes Hyperlipidemia in his mother; Hypertension in his mother.  ROS:   Please see the history of present illness.    Review of Systems  Gastrointestinal: Negative for hematochezia and melena.  Genitourinary: Negative for hematuria.   All other systems reviewed and are negative.   EKGs/Labs/Other Test Reviewed:    EKG:  EKG is not ordered today.  The ekg ordered today demonstrates n/a  Recent Labs: 08/03/2019: BUN 22; Creatinine, Ser 1.46; Hemoglobin 14.6; Platelets 155; Potassium 3.6; Sodium 137   Recent Lipid Panel Lab Results  Component Value Date/Time   CHOL 194 03/13/2012 08:36 AM   TRIG 341.0 (H) 03/13/2012 08:36 AM   HDL 35.90 (L) 03/13/2012 08:36 AM   CHOLHDL 5 03/13/2012 08:36 AM   LDLDIRECT 98.5 03/13/2012 08:36 AM    Physical Exam:    VS:  BP 120/72   Pulse 82   Ht _0  (1.88 m)   Wt 219 lb 1.9 oz (99.4 kg)   SpO2 97%   BMI 28.13 kg/m     Wt Readings from Last 3 Encounters:  09/24/19 219 lb 1.9 oz (99.4 kg)  08/22/19  211 lb (95.7 kg)  08/15/19 205 lb (93 kg)     Physical Exam  Constitutional: He is oriented to person, place, and time. He appears well-developed and well-nourished. No distress.  HENT:  Head: Normocephalic and atraumatic.  Eyes: No scleral icterus.  Neck: No JVD present. No thyromegaly  present.  Cardiovascular: Normal rate and normal heart sounds. An irregularly irregular rhythm present.  No murmur heard. Pulmonary/Chest: Effort normal and breath sounds normal. He has no rales.  Abdominal: Soft. There is no hepatomegaly.  Musculoskeletal:        General: Edema (1+ bilat ankle edema (R>L)) present.  Lymphadenopathy:    He has no cervical adenopathy.  Neurological: He is alert and oriented to person, place, and time.  Skin: Skin is warm and dry.  Psychiatric: He has a normal mood and affect.    ASSESSMENT & PLAN:    1. Persistent atrial fibrillation (HCC) His average heart rate on his recent monitor was 93.  However, he did have heart rates up to a maximum of 178.  Question if his shortness of breath may be related to elevated heart rates.  We discussed the pros and cons of rate control versus rhythm control.  As his left atrium is moderately enlarged, he would likely need antiarrhythmic drug therapy to help maintain sinus rhythm.  Given his history of vascular disease, I am not certain he will be a good candidate for class Ic antiarrhythmics.  He would likely either need to take sotalol, amiodarone or dofetilide.  Given his COPD, we may want to try to avoid amiodarone if possible.  Currently, we agree that we should try to control his heart rate better with adjusting his beta-blocker.  If he continues to have symptoms despite this, we can certainly consider rhythm control.  -Increase carvedilol to 18.75 mg twice daily  -Continue Apixaban  -Follow-up with Dr. Burt [name] or me in 4 to 6 weeks  -BMET, CBC today  2. PAOD (peripheral arterial occlusive disease) ([place]) 3. Left-sided carotid  artery disease, unspecified type ([place]) Continue follow-up with Dr. Oneida [name].  4. Stage 3a chronic kidney disease Obtain repeat CBC today.   5. Essential hypertension Blood pressure is controlled.  He does note lower extremity swelling.  I suspect that this may be multifactorial and related to venous insufficiency as well as possible recent episode of gout.  This may also be exacerbated by amlodipine.  As I am increasing his carvedilol, I will reduce his amlodipine to 5 mg daily.  6. Mixed hyperlipidemia Continue statin therapy.  7.  Ankle pain I suspect he had an episode of gout recently.  I have asked him to follow-up with primary care for further evaluation.   Dispo:  Return in about 6 weeks (around 11/05/2019) for Routine Follow Up, w/ Dr. Burt [name], or [name], PA-C, in person.   Medication Adjustments/Labs and Tests Ordered: Current medicines are reviewed at length with the patient today.  Concerns regarding medicines are outlined above.  Tests Ordered: Orders Placed This Encounter  Procedures  . Basic metabolic panel  . CBC   Medication Changes: Meds ordered this encounter  Medications  . amLODipine (NORVASC) 5 MG tablet    Sig: Take 1 tablet (5 mg total) by mouth daily.    Dispense:  90 tablet    Refill:  3  . carvedilol (COREG) 12.5 MG tablet    Sig: Take 1.5 tablets (18.75 mg total) by mouth 2 (two) times daily.    Dispense:  90 tablet    Refill:  3    Signed, [name], PA-C  09/24/2019 1:35 PM    [facility] [place], [place], [place]  [address] Phone: [phone]; Fax: [phone]

## 2019-09-24 ENCOUNTER — Other Ambulatory Visit: Payer: Self-pay

## 2019-09-24 ENCOUNTER — Encounter: Payer: Self-pay | Admitting: Physician Assistant

## 2019-09-24 ENCOUNTER — Ambulatory Visit (INDEPENDENT_AMBULATORY_CARE_PROVIDER_SITE_OTHER): Payer: Medicare Other | Admitting: Physician Assistant

## 2019-09-24 VITALS — BP 120/72 | HR 82 | Ht 74.0 in | Wt 219.1 lb

## 2019-09-24 DIAGNOSIS — I4819 Other persistent atrial fibrillation: Secondary | ICD-10-CM

## 2019-09-24 DIAGNOSIS — I779 Disorder of arteries and arterioles, unspecified: Secondary | ICD-10-CM

## 2019-09-24 DIAGNOSIS — M25571 Pain in right ankle and joints of right foot: Secondary | ICD-10-CM

## 2019-09-24 DIAGNOSIS — N1831 Chronic kidney disease, stage 3a: Secondary | ICD-10-CM

## 2019-09-24 DIAGNOSIS — E782 Mixed hyperlipidemia: Secondary | ICD-10-CM

## 2019-09-24 DIAGNOSIS — I10 Essential (primary) hypertension: Secondary | ICD-10-CM | POA: Diagnosis not present

## 2019-09-24 LAB — CBC
Hematocrit: 40.4 % (ref 37.5–51.0)
Hemoglobin: 14 g/dL (ref 13.0–17.7)
MCH: 31.2 pg (ref 26.6–33.0)
MCHC: 34.7 g/dL (ref 31.5–35.7)
MCV: 90 fL (ref 79–97)
Platelets: 180 10*3/uL (ref 150–450)
RBC: 4.49 x10E6/uL (ref 4.14–5.80)
RDW: 12.4 % (ref 11.6–15.4)
WBC: 11.1 10*3/uL — ABNORMAL HIGH (ref 3.4–10.8)

## 2019-09-24 LAB — BASIC METABOLIC PANEL
BUN/Creatinine Ratio: 14 (ref 10–24)
BUN: 25 mg/dL (ref 8–27)
CO2: 23 mmol/L (ref 20–29)
Calcium: 9.3 mg/dL (ref 8.6–10.2)
Chloride: 100 mmol/L (ref 96–106)
Creatinine, Ser: 1.81 mg/dL — ABNORMAL HIGH (ref 0.76–1.27)
GFR calc Af Amer: 42 mL/min/{1.73_m2} — ABNORMAL LOW (ref >59)
GFR calc non Af Amer: 36 mL/min/{1.73_m2} — ABNORMAL LOW (ref >59)
Glucose: 110 mg/dL — ABNORMAL HIGH (ref 65–99)
Potassium: 4.7 mmol/L (ref 3.5–5.2)
Sodium: 137 mmol/L (ref 134–144)

## 2019-09-24 MED ORDER — AMLODIPINE BESYLATE 5 MG PO TABS: 5.0000 mg | ORAL_TABLET | Freq: Every day | ORAL | 3 refills | Status: DC

## 2019-09-24 MED ORDER — CARVEDILOL 12.5 MG PO TABS: 18.7500 mg | ORAL_TABLET | Freq: Two times a day (BID) | ORAL | 3 refills | Status: DC

## 2019-09-24 NOTE — Patient Instructions (Signed)
Medication Instructions:  Your physician has recommended you make the following change in your medication:  1.  REDUCE the Amlodipine to 5 mg 1 tablet daily.  You may cut the 10 mg tablets in 1/2 and use them up 2.  INCREASE the Carvedilol to 18.75 mg twice a day.  You may take 1 and 1/2 tablet of the 12.5 mgs that you have now twice a day   *If you need a refill on your cardiac medications before your next appointment, please call your pharmacy*  Lab Work: BMET & CBC  If you have labs (blood work) drawn today and your tests are completely normal, you will receive your results only by: [name] MyChart Message (if you have MyChart) OR . A paper copy in the mail If you have any lab test that is abnormal or we need to change your treatment, we will call you to review the results.  Testing/Procedures: None ordered   Follow-Up: At [facility], you and your health needs are our priority.  As part of our continuing mission to provide you with exceptional heart care, we have created designated Provider Care Teams.  These Care Teams include your primary Cardiologist (physician) and Advanced Practice Providers (APPs -  Physician Assistants and Nurse Practitioners) who all work together to provide you with the care you need, when you need it.  Your next appointment:   10-29-2019 ARRIVE AT 8:00 A.M.  The format for your next appointment:   In Person  Provider:   [name], PA-C  Other Instructions

## 2019-09-30 ENCOUNTER — Telehealth: Payer: Self-pay | Admitting: *Deleted

## 2019-09-30 DIAGNOSIS — Z79899 Other long term (current) drug therapy: Secondary | ICD-10-CM

## 2019-09-30 NOTE — Telephone Encounter (Signed)
-----   Message from Liliane Shi, [place] sent at 09/25/2019  9:08 AM EDT ----- Creatinine increase since last lab but overall stable.  K+ normal.  WBC stable.  Hgb normal.  PLAN:   -Continue current medications and follow up as planned.   -Repeat BMET 4 weeks  [name], PA-C    09/25/2019 9:07 AM

## 2019-10-15 ENCOUNTER — Telehealth: Payer: Self-pay | Admitting: Cardiovascular Disease

## 2019-10-15 NOTE — Telephone Encounter (Signed)
Call received from dental office.  Question if Pt can have routine dental cleaning after CAROTID stent.  Advised carotid stent placed by vascular surgeon.  Advised to call vascular office for recommendations.

## 2019-10-15 NOTE — Telephone Encounter (Signed)
  1. What dental office are you calling from? [place]  2. What is your office phone number? [phone]  3. What is your fax number? [phone]  4. What type of procedure is the patient having performed? Periodontal cleaning  5. What date is procedure scheduled or is the patient there now? Now (if the patient is at the dentist's office question goes to their cardiologist if he/she is in the office.  If not, question should go to the DOD).   6. What is your question (ex. Antibiotics prior to procedure, holding medication-we need to know how long dentist wants pt to hold med)?  Is it okay for him to have cleaning since he had a stent done six weeks ago? Patient is on 2000 mg of antibiotic due to knee replacement.

## 2019-10-28 NOTE — Progress Notes (Signed)
Cardiology Office Note:    Date:  10/29/2019   ID:  [name], DOB [birth year], MRN [id number]  PCP:  Raelene Bott, MD  Cardiologist:  Sherren Mocha, MD  Electrophysiologist:  None   Referring MD: Raelene Bott, MD   Chief Complaint  Patient presents with  . Follow-up    AFib    History of Present Illness:    [name] is a 73 y.o. male with:   Atrial fibrillation  ? CHADS2-VASc=5 (age x 1, vascular dz, HTN, CVA) >> Apixaban   PAD  Carotid artery disease ? S/p L CEA 2007 ? S/p L CCA stenting in 2011 2/2 restenosis ? S/p L ICA stenting 07/2019 2/2 restenosis   Hx of CVA   Hypertension   Hyperlipidemia   Chronic kidney disease 3  COPD  [name] has been followed for vascular disease by Dr. Oneida Alar and underwent stenting to the L ICA due to restenosis in 07/2019.  He was noted to be in atrial fibrillation and was referred back to Dr. Burt Knack.  He was taken off of Ticagrelor, kept on ASA and placed on Apixaban.  An echocardiogram demonstrated normal LV function and mod LAE.  A 3 day Zio patch demonstrated an average HR of 93.  However, he had heart rates up to 178 at times.  He was last seen 09/24/2019.  He noted shortness of breath at times.  His beta-blocker was adjusted for better heart rate control.  He returns for follow-up.  Since increasing his carvedilol, he notes increasing fatigue.  He also continues to note shortness of breath with exertion.  He has not had chest discomfort, arm or jaw discomfort.  He has not had orthopnea, PND.  Lower extremity swelling is somewhat improved.  He has not had syncope.  He does continue to smoke.   Prior CV studies:   The following studies were reviewed today:  3 day Zio Monitor 09/2019 Avg HR 93 (max HR 178) 2 episodes of NSVT (longest 4 beats)   Echocardiogram 08/27/2019 EF 55-60, mild LVH, mod LAE, mild MAC, trace MR, mod TR, RVSP 27.4   Carotid US 08/15/2019 Summary: Right Carotid: Velocities in the right  ICA are consistent with a 1-39% stenosis.                Non-hemodynamically significant plaque <50% noted in the CCA. The                ECA appears <50% stenosed. Left Carotid: There is no evidence of stenosis in the left ICA. Patent left               common carotid artery stent with no evidence of restenosis. Vertebrals:  Right vertebral artery demonstrates antegrade flow. Left vertebral              artery demonstrates high resistant flow. Subclavians: Normal flow hemodynamics were seen in bilateral subclavian              arteries.   Myoview 02/04/15 Low risk stress nuclear study with small-sized, mild intensity fixed apical perfusion defect (which is actually worse at rest than stress), likely attenuation artifact.  LV Ejection Fraction: 60%.     Past Medical History:  Diagnosis Date  . CKD (chronic kidney disease), stage III   . Dyspnea on exertion   . Edema   . HTN (hypertension)   . Hyperlipidemia   . Left carotid stenosis    a. 2007 s/p L CEA;  b. 2011 s/p L common carotid stenting 2/2 restenosis;  c. 12/9620 u/s: RICA <29, LICA 79-89%, patent LCCA stent.  [name] PAD (peripheral artery disease) (HCC)    a. s/p prior RSFA, REIA, distal LSFA (known to be occluded), LEIA, and LCIA stenting;  b. 01/2015 ABI's: R 0.78, L 0.53.  [name] Stroke [facility])    Surgical Hx: The patient  has a past surgical history that includes Lumbar disc surgery; carotid endarterecotomy; stnt; external iliac; lumbar back surgery; Tonsillectomy; Tendon repair; abdominal aortagram (N/A, 02/05/2015); AORTIC ARCH ANGIOGRAPHY (N/A, 07/19/2019); CAROTID ANGIOGRAPHY (N/A, 07/19/2019); and CAROTID PTA/STENT INTERVENTION (Left, 08/02/2019).   Current Medications: Current Meds  Medication Sig  . albuterol (VENTOLIN HFA) 108 (90 Base) MCG/ACT inhaler SMARTSIG:2 Puff(s) By Mouth Every 6 Hours PRN  . amLODipine (NORVASC) 5 MG tablet Take 1 tablet (5 mg total) by mouth daily.  [name] apixaban (ELIQUIS) 5 MG TABS tablet Take 1 tablet (5  mg total) by mouth 2 (two) times daily.  [name] aspirin EC 81 MG tablet Take 1 tablet (81 mg total) by mouth daily.  . carvedilol (COREG) 12.5 MG tablet Take 1.5 tablets (18.75 mg total) by mouth 2 (two) times daily.  [name] losartan-hydrochlorothiazide (HYZAAR) 50-12.5 MG per tablet Take 1 tablet by mouth daily.  [name] omeprazole (PRILOSEC) 20 MG capsule Take 20 mg by mouth daily with breakfast.   . oxyCODONE-acetaminophen (PERCOCET) 7.5-325 MG tablet Take 0.5 tablets by mouth every 8 (eight) hours as needed for severe pain.  . rosuvastatin (CRESTOR) 10 MG tablet Take 10 mg by mouth every evening.   . TRELEGY ELLIPTA 100-62.5-25 MCG/INH AEPB Inhale 1 puff into the lungs daily.     Allergies:   Plavix [clopidogrel]   Social History   Tobacco Use  . Smoking status: Current Every Day Smoker    Packs/day: 1.00    Years: 50.00    Pack years: 50.00    Types: Cigarettes  . Smokeless tobacco: Never Used  Substance Use Topics  . Alcohol use: Yes    Alcohol/week: 7.0 standard drinks    Types: 7 Glasses of wine per week  . Drug use: No     Family Hx: The patient's family history includes Hyperlipidemia in his mother; Hypertension in his mother.  ROS:   Please see the history of present illness.    Review of Systems  Gastrointestinal: Negative for hematochezia and melena.  Genitourinary: Negative for hematuria.   All other systems reviewed and are negative.   EKGs/Labs/Other Test Reviewed:    EKG:  EKG is  ordered today.  The ekg ordered today demonstrates atrial fibrillation, HR 90, normal axis, QTC 430, no ST-T wave changes, no change from prior tracing  Recent Labs: 09/24/2019: BUN 25; Creatinine, Ser 1.81; Hemoglobin 14.0; Platelets 180; Potassium 4.7; Sodium 137   Recent Lipid Panel Lab Results  Component Value Date/Time   CHOL 194 03/13/2012 08:36 AM   TRIG 341.0 (H) 03/13/2012 08:36 AM   HDL 35.90 (L) 03/13/2012 08:36 AM   CHOLHDL 5 03/13/2012 08:36 AM   LDLDIRECT 98.5 03/13/2012  08:36 AM    Physical Exam:    VS:  BP 130/74   Pulse 65   Ht _0  (1.88 m)   Wt 223 lb 12.8 oz (101.5 kg)   SpO2 98%   BMI 28.73 kg/m     Wt Readings from Last 3 Encounters:  10/29/19 223 lb 12.8 oz (101.5 kg)  09/24/19 219 lb 1.9 oz (99.4 kg)  08/22/19 211 lb (95.7  kg)     Physical Exam  Constitutional: He is oriented to person, place, and time. He appears well-developed and well-nourished. No distress.  HENT:  Head: Normocephalic and atraumatic.  Eyes: No scleral icterus.  Neck: No JVD present. No thyromegaly present.  Cardiovascular: Normal rate and normal heart sounds. An irregularly irregular rhythm present.  No murmur heard. Pulmonary/Chest: Effort normal. He has decreased breath sounds. He has no rales.  Abdominal: Soft. There is no hepatomegaly.  Musculoskeletal:        General: Edema (tr-1+ bilat ankle edema ) present.  Lymphadenopathy:    He has no cervical adenopathy.  Neurological: He is alert and oriented to person, place, and time.  Skin: Skin is warm and dry.  Psychiatric: He has a normal mood and affect.    ASSESSMENT & PLAN:    1. Persistent atrial fibrillation ([place]) He remains in atrial fibrillation.  He seems to be symptomatic.  Unfortunately, increasing his carvedilol to better control his rate has caused increasing fatigue.  He notes adherence with anticoagulation.  He has not missed any doses of Apixaban.  We discussed risks and benefits of proceeding with cardioversion.  He may ultimately need antiarrhythmic drug therapy.  Given his COPD, dofetilide is probably the better choice.  He may also be a candidate for ablation.  I reviewed his case today with Dr. Curt Bears (attending MD) who agreed with proceeding with cardioversion.  -Continue current dose of carvedilol, apixaban  -Obtain BMET, CBC today  -Arrange preprocedure Covid test  -Arrange cardioversion next week  -Follow-up with Dr. Burt Knack or me 2 weeks after cardioversion  2. PAOD (peripheral  arterial occlusive disease) ([place]) Continue follow-up with Dr. Oneida Alar.  3. Stage 3a chronic kidney disease Obtain follow-up BMP today.  4. Essential hypertension Fair control.  Continue current therapy.  If he maintains sinus rhythm post cardioversion, consider decreasing carvedilol.   Dispo:  Return in about 2 weeks (around 11/12/2019) for Post Procedure Follow Up, w/ Dr. Burt Knack, or [name], PA-C, in person.   Medication Adjustments/Labs and Tests Ordered: Current medicines are reviewed at length with the patient today.  Concerns regarding medicines are outlined above.  Tests Ordered: Orders Placed This Encounter  Procedures  . Basic Metabolic Panel (BMET)  . CBC  . EKG 12-Lead   Medication Changes: No orders of the defined types were placed in this encounter.   Signed, [name], PA-C  10/29/2019 8:55 AM    [facility] [place], [place], [place]  [address] Phone: [phone]; Fax: [phone]

## 2019-10-29 ENCOUNTER — Encounter: Payer: Self-pay | Admitting: Physician Assistant

## 2019-10-29 ENCOUNTER — Ambulatory Visit (INDEPENDENT_AMBULATORY_CARE_PROVIDER_SITE_OTHER): Payer: Medicare Other | Admitting: Physician Assistant

## 2019-10-29 ENCOUNTER — Other Ambulatory Visit: Payer: Self-pay

## 2019-10-29 VITALS — BP 130/74 | HR 65 | Ht 74.0 in | Wt 223.8 lb

## 2019-10-29 DIAGNOSIS — I779 Disorder of arteries and arterioles, unspecified: Secondary | ICD-10-CM | POA: Diagnosis not present

## 2019-10-29 DIAGNOSIS — I10 Essential (primary) hypertension: Secondary | ICD-10-CM | POA: Diagnosis not present

## 2019-10-29 DIAGNOSIS — I4819 Other persistent atrial fibrillation: Secondary | ICD-10-CM

## 2019-10-29 DIAGNOSIS — N1831 Chronic kidney disease, stage 3a: Secondary | ICD-10-CM | POA: Diagnosis not present

## 2019-10-29 LAB — CBC
Hematocrit: 41.1 % (ref 37.5–51.0)
Hemoglobin: 14.2 g/dL (ref 13.0–17.7)
MCH: 30.6 pg (ref 26.6–33.0)
MCHC: 34.5 g/dL (ref 31.5–35.7)
MCV: 89 fL (ref 79–97)
Platelets: 168 10*3/uL (ref 150–450)
RBC: 4.64 x10E6/uL (ref 4.14–5.80)
RDW: 12.3 % (ref 11.6–15.4)
WBC: 9.8 10*3/uL (ref 3.4–10.8)

## 2019-10-29 LAB — BASIC METABOLIC PANEL
BUN/Creatinine Ratio: 14 (ref 10–24)
BUN: 28 mg/dL — ABNORMAL HIGH (ref 8–27)
CO2: 23 mmol/L (ref 20–29)
Calcium: 8.6 mg/dL (ref 8.6–10.2)
Chloride: 101 mmol/L (ref 96–106)
Creatinine, Ser: 1.96 mg/dL — ABNORMAL HIGH (ref 0.76–1.27)
GFR calc Af Amer: 38 mL/min/{1.73_m2} — ABNORMAL LOW (ref >59)
GFR calc non Af Amer: 32 mL/min/{1.73_m2} — ABNORMAL LOW (ref >59)
Glucose: 98 mg/dL (ref 65–99)
Potassium: 4.6 mmol/L (ref 3.5–5.2)
Sodium: 136 mmol/L (ref 134–144)

## 2019-10-29 NOTE — Patient Instructions (Addendum)
Medication Instructions:  Your physician recommends that you continue on your current medications as directed. Please refer to the Current Medication list given to you today.  *If you need a refill on your cardiac medications before your next appointment, please call your pharmacy*  Lab Work: TODAY: BMET, CBC   If you have labs (blood work) drawn today and your tests are completely normal, you will receive your results only by: [name] MyChart Message (if you have MyChart) OR . A paper copy in the mail If you have any lab test that is abnormal or we need to change your treatment, we will call you to review the results.  Testing/Procedures: Your physician has recommended that you have a Cardioversion (DCCV). Electrical Cardioversion uses a jolt of electricity to your heart either through paddles or wired patches attached to your chest. This is a controlled, usually prescheduled, procedure. Defibrillation is done under light anesthesia in the hospital, and you usually go home the day of the procedure. This is done to get your heart back into a normal rhythm. You are not awake for the procedure. Please see the instruction sheet given to you today.  Follow-Up: At [facility], you and your health needs are our priority.  As part of our continuing mission to provide you with exceptional heart care, we have created designated Provider Care Teams.  These Care Teams include your primary Cardiologist (physician) and Advanced Practice Providers (APPs -  Physician Assistants and Nurse Practitioners) who all work together to provide you with the care you need, when you need it.  Your next appointment:   12/21 AT 11:15 AM  The format for your next appointment:   In Person  Provider:     [name], PA-C  Other Instructions   You are scheduled for a  Cardioversion on 12/2 with Dr. Debara Pickett.  Please arrive at the [facility] (Main Entrance A) at [facility]: [address] [place], [place]  [address] at 11:30 pm.   DIET: Nothing to eat or drink after midnight except a sip of water with medications (see medication instructions below)  Medication Instructions:  Continue your anticoagulant: ELIQUIS You will need to continue your anticoagulant after your procedure until you are told by your  Provider that it is safe to stop  Labs: TODAY  You must have a responsible person to drive you home and stay in the waiting area during your procedure. Failure to do so could result in cancellation.  Bring your insurance cards.  *Special Note: Every effort is made to have your procedure done on time. Occasionally there are emergencies that occur at the hospital that may cause delays. Please be patient if a delay does occur.   Your Pre-procedure COVID-19 Testing will be done on 11/28 AT 12:25 PM at [facility] at [id number], [place], [place] [address]. Once you arrive at the testing site, stay in the right hand lane, go under the building overhang not the tent. If you are tested under the tent your results may not be back before your procedure. Please be on time for your appointment.  After your swab you will be given a mask to wear and instructed to go home and quarantine/no visitors until after your procedure. If you test positive you will be notified and your procedure will be cancelled.

## 2019-11-02 ENCOUNTER — Other Ambulatory Visit (HOSPITAL_COMMUNITY)
Admission: RE | Admit: 2019-11-02 | Discharge: 2019-11-02 | Disposition: A | Discharge status: Home / Self Care | Payer: Medicare Other | Source: Ambulatory Visit | Attending: Internal Medicine | Admitting: Internal Medicine

## 2019-11-02 DIAGNOSIS — Z20828 Contact with and (suspected) exposure to other viral communicable diseases: Secondary | ICD-10-CM | POA: Insufficient documentation

## 2019-11-02 DIAGNOSIS — Z01812 Encounter for preprocedural laboratory examination: Secondary | ICD-10-CM | POA: Insufficient documentation

## 2019-11-03 LAB — NOVEL CORONAVIRUS, NAA (HOSP ORDER, SEND-OUT TO REF LAB; TAT 18-24 HRS): SARS-CoV-2, NAA: NOT DETECTED

## 2019-11-06 ENCOUNTER — Ambulatory Visit (HOSPITAL_COMMUNITY)
Admission: RE | Admit: 2019-11-06 | Discharge: 2019-11-06 | Disposition: A | Discharge status: Home / Self Care | Payer: Medicare Other | Attending: Internal Medicine | Admitting: Internal Medicine

## 2019-11-06 ENCOUNTER — Encounter (HOSPITAL_COMMUNITY)
Admission: RE | Disposition: A | Discharge status: Home / Self Care | Payer: Self-pay | Source: Home / Self Care | Attending: Internal Medicine

## 2019-11-06 ENCOUNTER — Telehealth: Payer: Self-pay | Admitting: Physician Assistant

## 2019-11-06 ENCOUNTER — Ambulatory Visit (HOSPITAL_COMMUNITY): Payer: Medicare Other | Admitting: Certified Registered Nurse Anesthetist

## 2019-11-06 ENCOUNTER — Other Ambulatory Visit: Payer: Self-pay

## 2019-11-06 ENCOUNTER — Encounter (HOSPITAL_COMMUNITY): Payer: Self-pay | Admitting: *Deleted

## 2019-11-06 DIAGNOSIS — I4819 Other persistent atrial fibrillation: Secondary | ICD-10-CM

## 2019-11-06 DIAGNOSIS — F17210 Nicotine dependence, cigarettes, uncomplicated: Secondary | ICD-10-CM | POA: Insufficient documentation

## 2019-11-06 DIAGNOSIS — R001 Bradycardia, unspecified: Secondary | ICD-10-CM | POA: Insufficient documentation

## 2019-11-06 DIAGNOSIS — Z7901 Long term (current) use of anticoagulants: Secondary | ICD-10-CM | POA: Diagnosis not present

## 2019-11-06 DIAGNOSIS — Z79899 Other long term (current) drug therapy: Secondary | ICD-10-CM | POA: Insufficient documentation

## 2019-11-06 DIAGNOSIS — Z95828 Presence of other vascular implants and grafts: Secondary | ICD-10-CM | POA: Diagnosis not present

## 2019-11-06 DIAGNOSIS — N1831 Chronic kidney disease, stage 3a: Secondary | ICD-10-CM | POA: Diagnosis not present

## 2019-11-06 DIAGNOSIS — I129 Hypertensive chronic kidney disease with stage 1 through stage 4 chronic kidney disease, or unspecified chronic kidney disease: Secondary | ICD-10-CM | POA: Diagnosis not present

## 2019-11-06 DIAGNOSIS — Z8249 Family history of ischemic heart disease and other diseases of the circulatory system: Secondary | ICD-10-CM | POA: Diagnosis not present

## 2019-11-06 DIAGNOSIS — E785 Hyperlipidemia, unspecified: Secondary | ICD-10-CM | POA: Insufficient documentation

## 2019-11-06 DIAGNOSIS — I6522 Occlusion and stenosis of left carotid artery: Secondary | ICD-10-CM | POA: Insufficient documentation

## 2019-11-06 DIAGNOSIS — Z888 Allergy status to other drugs, medicaments and biological substances status: Secondary | ICD-10-CM | POA: Diagnosis not present

## 2019-11-06 DIAGNOSIS — J449 Chronic obstructive pulmonary disease, unspecified: Secondary | ICD-10-CM | POA: Insufficient documentation

## 2019-11-06 DIAGNOSIS — I480 Paroxysmal atrial fibrillation: Secondary | ICD-10-CM

## 2019-11-06 DIAGNOSIS — Z8673 Personal history of transient ischemic attack (TIA), and cerebral infarction without residual deficits: Secondary | ICD-10-CM | POA: Insufficient documentation

## 2019-11-06 DIAGNOSIS — I739 Peripheral vascular disease, unspecified: Secondary | ICD-10-CM | POA: Insufficient documentation

## 2019-11-06 DIAGNOSIS — Z7982 Long term (current) use of aspirin: Secondary | ICD-10-CM | POA: Insufficient documentation

## 2019-11-06 HISTORY — PX: CARDIOVERSION: SHX1299

## 2019-11-06 SURGERY — CARDIOVERSION
Anesthesia: General

## 2019-11-06 MED ORDER — SODIUM CHLORIDE 0.9 % IV SOLN
INTRAVENOUS | Status: DC
  Administered 2019-11-06: 12:00:00 via INTRAVENOUS

## 2019-11-06 MED ORDER — LIDOCAINE 2% (20 MG/ML) 5 ML SYRINGE
INTRAMUSCULAR | Status: DC | PRN
  Administered 2019-11-06: 40 mg via INTRAVENOUS

## 2019-11-06 MED ORDER — PROPOFOL 10 MG/ML IV BOLUS
INTRAVENOUS | Status: DC | PRN
  Administered 2019-11-06: 70 mg via INTRAVENOUS

## 2019-11-06 MED ORDER — HYDROCORTISONE 1 % EX CREA: 1.0000 "application " | TOPICAL_CREAM | Freq: Three times a day (TID) | CUTANEOUS | Status: DC | PRN

## 2019-11-06 NOTE — Anesthesia Procedure Notes (Signed)
Procedure Name: General with mask airway Date/Time: 11/06/2019 12:22 PM Performed by: Janene Harvey, CRNA Pre-anesthesia Checklist: Patient identified, Emergency Drugs available, Suction available and Patient being monitored Oxygen Delivery Method: Ambu bag Dental Injury: Teeth and Oropharynx as per pre-operative assessment

## 2019-11-06 NOTE — Anesthesia Preprocedure Evaluation (Signed)
Anesthesia Evaluation  Patient identified by MRN, date of birth, ID band Patient awake    Reviewed: Allergy & Precautions, NPO status , Patient's Chart, lab work & pertinent test results  Airway Mallampati: II  TM Distance: >3 FB     Dental   Pulmonary Current Smoker,    breath sounds clear to auscultation       Cardiovascular hypertension,  Rhythm:Irregular Rate:Abnormal     Neuro/Psych    GI/Hepatic   Endo/Other    Renal/GU      Musculoskeletal   Abdominal   Peds  Hematology   Anesthesia Other Findings   Reproductive/Obstetrics                             Anesthesia Physical Anesthesia Plan  ASA: III  Anesthesia Plan: General   Post-op Pain Management:    Induction: Intravenous  PONV Risk Score and Plan:   Airway Management Planned: Mask  Additional Equipment:   Intra-op Plan:   Post-operative Plan:   Informed Consent: I have reviewed the patients History and Physical, chart, labs and discussed the procedure including the risks, benefits and alternatives for the proposed anesthesia with the patient or authorized representative who has indicated his/her understanding and acceptance.       Plan Discussed with: CRNA and Anesthesiologist  Anesthesia Plan Comments:         Anesthesia Quick Evaluation

## 2019-11-06 NOTE — Telephone Encounter (Signed)
Reviewed post DCCV ECG from 11/06/2019. Note it shows sinus bradycardia with HR in the 40s. PLAN:  1. Reduce Carvedilol to 12.5 mg twice daily  [name], PA-C    11/06/2019 8:14 PM

## 2019-11-06 NOTE — H&P (Signed)
   INTERVAL PROCEDURE H&P  History and Physical Interval Note:  11/06/2019 12:17 PM  [name] has presented today for their planned procedure. The various methods of treatment have been discussed with the patient and family. After consideration of risks, benefits and other options for treatment, the patient has consented to the procedure.  The patients' outpatient history has been reviewed, patient examined, and no change in status from most recent office note within the past 30 days. I have reviewed the patients' chart and labs and will proceed as planned. Questions were answered to the patient's satisfaction.   [name] Casino, MD, [facility], [place] Director of the Advanced Lipid Disorders &  Cardiovascular Risk Reduction Clinic Diplomate of the American Board of Clinical Lipidology Attending Cardiologist  Direct Dial: [phone]  Fax: [phone]  Website:  [name] 11/06/2019, 12:17 PM

## 2019-11-06 NOTE — Transfer of Care (Signed)
Immediate Anesthesia Transfer of Care Note  Patient: [name]  Procedure(s) Performed: CARDIOVERSION (N/A )  Patient Location: Endoscopy Unit  Anesthesia Type:General  Level of Consciousness: awake  Airway & Oxygen Therapy: Patient Spontanous Breathing  Post-op Assessment: Report given to RN and Post -op Vital signs reviewed and stable  Post vital signs: Reviewed  Last Vitals:  Vitals Value Taken Time  BP    Temp    Pulse    Resp    SpO2      Last Pain:  Vitals:   11/06/19 1148  TempSrc: Temporal  PainSc: 0-No pain         Complications: No apparent anesthesia complications

## 2019-11-06 NOTE — CV Procedure (Signed)
   CARDIOVERSION NOTE  Procedure: Electrical Cardioversion Indications:  Atrial Fibrillation  Procedure Details:  Consent: Risks of procedure as well as the alternatives and risks of each were explained to the (patient/caregiver).  Consent for procedure obtained.  Time Out: Verified patient identification, verified procedure, site/side was marked, verified correct patient position, special equipment/implants available, medications/allergies/relevent history reviewed, required imaging and test results available.  Performed  Patient placed on cardiac monitor, pulse oximetry, supplemental oxygen as necessary.  Sedation given: propofol per anesthesia Pacer pads placed anterior and posterior chest.  Cardioverted 1 time(s).  Cardioverted at 150J biphasic.  Impression: Findings: Post procedure EKG shows: sinus bradycardia Complications: None Patient did tolerate procedure well.  Plan: 1. Successful DCCV with a single 150J Biphasic shock.  Time Spent Directly with the Patient:  30 minutes   Pixie Casino, MD, [facility], [place] Director of the Advanced Lipid Disorders &  Cardiovascular Risk Reduction Clinic Diplomate of the American Board of Clinical Lipidology Attending Cardiologist  Direct Dial: [phone]  Fax: [phone]  Website:  [email or web address] 11/06/2019, 12:36 PM

## 2019-11-06 NOTE — Anesthesia Postprocedure Evaluation (Signed)
Anesthesia Post Note  Patient: [name]  Procedure(s) Performed: CARDIOVERSION (N/A )     Patient location during evaluation: PACU Anesthesia Type: General Level of consciousness: awake and alert Pain management: pain level controlled Vital Signs Assessment: post-procedure vital signs reviewed and stable Respiratory status: spontaneous breathing, nonlabored ventilation, respiratory function stable and patient connected to nasal cannula oxygen Cardiovascular status: blood pressure returned to baseline and stable Postop Assessment: no apparent nausea or vomiting Anesthetic complications: no    Last Vitals:  Vitals:   11/06/19 1238 11/06/19 1249  BP: (!) 111/50 (!) 102/47  Pulse: (!) 44 (!) 40  Resp: 14 13  Temp: 36.5 C   SpO2: 93% 99%    Last Pain:  Vitals:   11/06/19 1249  TempSrc:   PainSc: 0-No pain                 Cledis Sohn COKER

## 2019-11-07 MED ORDER — CARVEDILOL 12.5 MG PO TABS: 12.5000 mg | ORAL_TABLET | Freq: Two times a day (BID) | ORAL | 3 refills | Status: DC

## 2019-11-07 NOTE — Telephone Encounter (Signed)
Pt advised [name] recommendation to decrease his Coreg to 12.5 mg twice a day and will keep his OV 11/25/19.

## 2019-11-11 ENCOUNTER — Encounter: Payer: Self-pay | Admitting: Cardiovascular Disease

## 2019-11-11 ENCOUNTER — Other Ambulatory Visit: Payer: Self-pay

## 2019-11-11 ENCOUNTER — Ambulatory Visit (INDEPENDENT_AMBULATORY_CARE_PROVIDER_SITE_OTHER): Payer: Medicare Other | Admitting: Cardiovascular Disease

## 2019-11-11 ENCOUNTER — Telehealth: Payer: Self-pay | Admitting: Cardiovascular Disease

## 2019-11-11 VITALS — BP 150/70 | HR 54 | Ht 74.0 in | Wt 229.4 lb

## 2019-11-11 DIAGNOSIS — I779 Disorder of arteries and arterioles, unspecified: Secondary | ICD-10-CM

## 2019-11-11 DIAGNOSIS — N1831 Chronic kidney disease, stage 3a: Secondary | ICD-10-CM

## 2019-11-11 DIAGNOSIS — I10 Essential (primary) hypertension: Secondary | ICD-10-CM | POA: Diagnosis not present

## 2019-11-11 DIAGNOSIS — I480 Paroxysmal atrial fibrillation: Secondary | ICD-10-CM | POA: Diagnosis not present

## 2019-11-11 DIAGNOSIS — I5031 Acute diastolic (congestive) heart failure: Secondary | ICD-10-CM

## 2019-11-11 MED ORDER — FUROSEMIDE 40 MG PO TABS: 40.0000 mg | ORAL_TABLET | Freq: Every day | ORAL | 3 refills | Status: DC

## 2019-11-11 MED ORDER — POTASSIUM CHLORIDE ER 10 MEQ PO TBCR: 10.0000 meq | EXTENDED_RELEASE_TABLET | Freq: Every day | ORAL | 3 refills | Status: DC

## 2019-11-11 NOTE — Progress Notes (Signed)
Cardiology Office Note:    Date:  11/11/2019   ID:  [name], DOB 1946/03/13, MRN [id number]  PCP:  Raelene Bott, MD  Cardiologist:  Sherren Mocha, MD  Electrophysiologist:  None   Referring MD: Raelene Bott, MD   Chief Complaint  Patient presents with  . Atrial Fibrillation    History of Present Illness:    [name] is a 73 y.o. male with a hx of peripheral arterial disease, atrial fibrillation, hypertension, chronic kidney disease, and COPD, presenting for follow-up evaluation.  The patient was diagnosed in atrial fibrillation within the past few months.  He was noted to have elevated heart rates.  His beta-blocker was increased and he came back for follow-up evaluation still in atrial fibrillation.  Outpatient monitoring showed an average heart rate of 93 bpm.  He had been started on apixaban for anticoagulation and he underwent cardioversion last week. He called in today with progressive shortness of breath and there was concern that he might be back in atrial fibrillation. He has felt more short of breath over the past 3-4 days. He is short of breath with talking or with low level walking. He clearly states that he feels worse since undergoing cardioversion. He denies chest pain or pressure. He also complains of orthopnea and PND. He also complains of leg swelling.   Past Medical History:  Diagnosis Date  . CKD (chronic kidney disease), stage III   . Dyspnea on exertion   . Edema   . HTN (hypertension)   . Hyperlipidemia   . Left carotid stenosis    a. 2007 s/p L CEA;  b. 2011 s/p L common carotid stenting 2/2 restenosis;  c. [id number] u/s: RICA [id number], LICA [id number], patent LCCA stent.  [name] PAD (peripheral artery disease) (HCC)    a. s/p prior RSFA, REIA, distal LSFA (known to be occluded), LEIA, and LCIA stenting;  b. 01/2015 ABI's: R 0.78, L 0.53.  [name] Stroke [facility])     Past Surgical History:  Procedure Laterality Date  . ABDOMINAL AORTAGRAM N/A 02/05/2015   Procedure:  ABDOMINAL Maxcine [place];  Surgeon: Conrad [place], MD;  Location: [facility] CATH LAB;  Service: Cardiovascular;  Laterality: N/A;  . AORTIC ARCH ANGIOGRAPHY N/A 07/19/2019   Procedure: AORTIC ARCH ANGIOGRAPHY;  Surgeon: Elam Dutch, MD;  Location: [facility] CV LAB;  Service: Cardiovascular;  Laterality: N/A;  . CARDIOVERSION N/A 11/06/2019   Procedure: CARDIOVERSION;  Surgeon: Pixie Casino, MD;  Location: [facility] ENDOSCOPY;  Service: Cardiovascular;  Laterality: N/A;  . CAROTID ANGIOGRAPHY N/A 07/19/2019   Procedure: CAROTID ANGIOGRAPHY;  Surgeon: Elam Dutch, MD;  Location: [facility] CV LAB;  Service: Cardiovascular;  Laterality: N/A;  . carotid endarterecotomy    . CAROTID PTA/STENT INTERVENTION Left 08/02/2019   Procedure: CAROTID PTA/STENT INTERVENTION;  Surgeon: Elam Dutch, MD;  Location: [facility] CV LAB;  Service: Cardiovascular;  Laterality: Left;  . external iliac     status post bilateral and left common iliac artery  . lumbar back surgery     x2  . LUMBAR DISC SURGERY     x 2  . stnt     lower extermity  . TENDON REPAIR     to the left arm  . TONSILLECTOMY      Current Medications: Current Meds  Medication Sig  . albuterol (VENTOLIN HFA) 108 (90 Base) MCG/ACT inhaler Inhale 2 puffs into the lungs every 6 (six) hours as needed (wheezing/shortness of breath).   [name]  amLODipine (NORVASC) 5 MG tablet Take 1 tablet (5 mg total) by mouth daily.  [name] apixaban (ELIQUIS) 5 MG TABS tablet Take 1 tablet (5 mg total) by mouth 2 (two) times daily.  [name] aspirin EC 81 MG tablet Take 1 tablet (81 mg total) by mouth daily.  [name] azelastine (ASTELIN) 0.1 % nasal spray Place 1-2 sprays into both nostrils 2 (two) times daily as needed for rhinitis. Use in each nostril as directed  . carvedilol (COREG) 12.5 MG tablet Take 1 tablet (12.5 mg total) by mouth 2 (two) times daily.  [name] omeprazole (PRILOSEC) 20 MG capsule Take 20 mg by mouth daily with breakfast.   . oxyCODONE-acetaminophen (PERCOCET)  7.5-325 MG tablet Take 0.5 tablets by mouth every 5 (five) hours as needed for severe pain.   . rosuvastatin (CRESTOR) 10 MG tablet Take 10 mg by mouth every evening.   . TRELEGY ELLIPTA 100-62.5-25 MCG/INH AEPB Inhale 1 puff into the lungs at bedtime.   . [DISCONTINUED] losartan-hydrochlorothiazide (HYZAAR) 50-12.5 MG per tablet Take 1 tablet by mouth daily.     Allergies:   Plavix [clopidogrel]   Social History   Socioeconomic History  . Marital status: Married    Spouse name: Not on file  . Number of children: Not on file  . Years of education: Not on file  . Highest education level: Not on file  Occupational History  . Not on file  Social Needs  . Financial resource strain: Not on file  . Food insecurity    Worry: Not on file    Inability: Not on file  . Transportation needs    Medical: Not on file    Non-medical: Not on file  Tobacco Use  . Smoking status: Current Every Day Smoker    Packs/day: 1.00    Years: 50.00    Pack years: 50.00    Types: Cigarettes  . Smokeless tobacco: Never Used  Substance and Sexual Activity  . Alcohol use: Yes    Alcohol/week: 7.0 standard drinks    Types: 7 Glasses of wine per week  . Drug use: No  . Sexual activity: Not on file  Lifestyle  . Physical activity    Days per week: Not on file    Minutes per session: Not on file  . Stress: Not on file  Relationships  . Social Herbalist on phone: Not on file    Gets together: Not on file    Attends religious service: Not on file    Active member of club or organization: Not on file    Attends meetings of clubs or organizations: Not on file    Relationship status: Not on file  Other Topics Concern  . Not on file  Social History Narrative  . Not on file     Family History: The patient's family history includes Hyperlipidemia in his mother; Hypertension in his mother.  ROS:   Please see the history of present illness.    All other systems reviewed and are negative.   EKGs/Labs/Other Studies Reviewed:    The following studies were reviewed today: Echo 08-27-2019: IMPRESSIONS    1. Left ventricular ejection fraction, by visual estimation, is 55 to 60%. The left ventricle has normal function. Normal left ventricular size. There is mildly increased left ventricular hypertrophy.  2. Left ventricular diastolic function could not be evaluated pattern of LV diastolic filling.  3. Global right ventricle has normal systolic function.The right ventricular size is normal. No increase  in right ventricular wall thickness.  4. Left atrial size was moderately dilated.  5. Right atrial size was normal.  6. Mild mitral annular calcification.  7. The mitral valve is normal in structure. Trace mitral valve regurgitation. No evidence of mitral stenosis.  8. The tricuspid valve is normal in structure. Tricuspid valve regurgitation moderate.  9. The aortic valve is normal in structure. Aortic valve regurgitation was not visualized by color flow Doppler. Structurally normal aortic valve, with no evidence of sclerosis or stenosis. 10. The pulmonic valve was normal in structure. Pulmonic valve regurgitation is not visualized by color flow Doppler. 11. Normal pulmonary artery systolic pressure. 12. The inferior vena cava is normal in size with greater than 50% respiratory variability, suggesting right atrial pressure of 3 mmHg.  EKG:  EKG is ordered today.  The ekg ordered today demonstrates sinus bradycardia 54 bpm, nonspecific T wave abnormality  Recent Labs: 10/29/2019: BUN 28; Creatinine, Ser 1.96; Hemoglobin 14.2; Platelets 168; Potassium 4.6; Sodium 136  Recent Lipid Panel    Component Value Date/Time   CHOL 194 03/13/2012 0836   TRIG 341.0 (H) 03/13/2012 0836   HDL 35.90 (L) 03/13/2012 0836   CHOLHDL 5 03/13/2012 0836   VLDL 68.2 (H) 03/13/2012 0836   LDLDIRECT 98.5 03/13/2012 0836    Physical Exam:    VS:  BP (!) 150/70   Pulse (!) 54   Ht 6\' 2"  (1.88 m)    Wt 229 lb 6.4 oz (104.1 kg)   SpO2 95%   BMI 29.45 kg/m     Wt Readings from Last 3 Encounters:  11/11/19 229 lb 6.4 oz (104.1 kg)  11/06/19 223 lb (101.2 kg)  10/29/19 223 lb 12.8 oz (101.5 kg)     GEN: Well nourished, well developed in no acute distress HEENT: Normal NECK: JVP moderately elevated; No carotid bruits LYMPHATICS: No lymphadenopathy CARDIAC: RRR, no murmurs, rubs, gallops RESPIRATORY:  Clear to auscultation without rales, wheezing or rhonchi  ABDOMEN: Soft, non-tender, non-distended MUSCULOSKELETAL:  1+ pretibial edema bilaterally; No deformity  SKIN: Warm and dry NEUROLOGIC:  Alert and oriented x 3 PSYCHIATRIC:  Normal affect   ASSESSMENT:    1. Essential hypertension   2. Acute diastolic heart failure (HCC)   3. Paroxysmal atrial fibrillation (HCC)   4. Stage 3a chronic kidney disease    PLAN:    In order of problems listed above:  1. Will adjust antihypertensive regimen (see below) 2. The patient has classic symptoms of heart failure with congestive symptoms of dyspnea, edema, orthopnea, and PND. His weight is up 24# over 3 months as well. I have recommended adding lasix 40 mg BID x 3 days, then 40 mg once daily. He will take KDur 10 meq daily. He will stop losartan HCT temporarily to allow for adequate diuresis in the setting of stage 3 CKD with most recent creatinine 1.9 mg/dL. Recent echo reviewed with normal LV systolic function.  3. Maintaining sinus rhythm post-cardioversion. Anticoagulated with apixaban.  4. Follow BMET when he returns to see Scott in 2 weeks.   Medication Adjustments/Labs and Tests Ordered: Current medicines are reviewed at length with the patient today.  Concerns regarding medicines are outlined above.  Orders Placed This Encounter  Procedures  . EKG 12-Lead   Meds ordered this encounter  Medications  . furosemide (LASIX) 40 MG tablet    Sig: Take 1 tablet (40 mg total) by mouth daily.    Dispense:  90 tablet    Refill:  3   .  potassium chloride (KLOR-CON) 10 MEQ tablet    Sig: Take 1 tablet (10 mEq total) by mouth daily.    Dispense:  90 tablet    Refill:  3    Patient Instructions  Medication Instructions:  1) STOP LOSARTAN HCT 2) START LASIX 40 mg daily. Take Lasix 40 mg twice daily for the first 3 days only. 3) START KDUR (potassium supplement) 10 meq daily  Labwork: You will have lab work when you return for your appointment with Nicki Reaper in a couple weeks.  Follow-Up: Please keep your follow up appointment as scheduled!    Signed, Sherren Mocha, MD  11/11/2019 5:00 PM    [place]

## 2019-11-11 NOTE — Patient Instructions (Signed)
Medication Instructions:  1) STOP LOSARTAN HCT 2) START LASIX 40 mg daily. Take Lasix 40 mg twice daily for the first 3 days only. 3) START KDUR (potassium supplement) 10 meq daily  Labwork: You will have lab work when you return for your appointment with Nicki Reaper in a couple weeks.  Follow-Up: Please keep your follow up appointment as scheduled!

## 2019-11-11 NOTE — Telephone Encounter (Signed)
I spoke with pt. He had cardioversion on 12/2. He thinks he may have felt better for 2 days but during this time he was taking it easy and not doing much.  On 12/4 he increased his activity and noted shortness of breath with exertion. No chest pain. Notes some superficial tenderness which he thinks may be related to area that was shaved at time of cardioversion. Cannot tell if heart rhythm is irregular and does not have way to check heart rate. Shortness of breath is continuing with any exertion. Feels heart pounding with this.  No fever, cough, chills, COVID exposure.  Does have chronic congestion and post nasal drip but this is not new.  Is smoking. Has follow up on 12/21 but he is concerned about ongoing shortness of breath. Will forward to Dr Burt Knack for review.

## 2019-11-11 NOTE — Telephone Encounter (Signed)
Appointment is at 4:00. Pt aware to arrive at 3:45.

## 2019-11-11 NOTE — Telephone Encounter (Signed)
Patient is calling because he is not feeling good since he had his cardioversion on 11/06/19. Patient is c/o dyspnea on exertion. Patient denies any other symptoms.

## 2019-11-11 NOTE — Telephone Encounter (Signed)
I spoke with pt and scheduled him to see Dr Burt Knack today at 3:45.  He is aware to arrive 15 minutes prior to appointment and wear a mask.

## 2019-11-11 NOTE — Telephone Encounter (Signed)
thx

## 2019-11-12 ENCOUNTER — Encounter (HOSPITAL_COMMUNITY): Payer: Self-pay

## 2019-11-12 ENCOUNTER — Ambulatory Visit (HOSPITAL_COMMUNITY): Admit: 2019-11-12 | Payer: Medicare Other | Admitting: Internal Medicine

## 2019-11-12 SURGERY — CARDIOVERSION
Anesthesia: General

## 2019-11-24 NOTE — Progress Notes (Signed)
Cardiology Office Note:    Date:  11/25/2019   ID:  [name], DOB 30-Jan-1946, MRN [id number]  PCP:  Raelene Bott, MD  Cardiologist:  Sherren Mocha, MD  Electrophysiologist:  None   Referring MD: Raelene Bott, MD   Chief Complaint  Patient presents with  . Follow-up    CHF    History of Present Illness:    [name] is a 73 y.o. male with:   Atrial fibrillation ? CHADS2-VASc=5 (age x 1, vascular dz, HTN, CVA) >>Apixaban  ? S/p DCCV 11/2019  PAD  Carotid artery disease ? S/p L CEA 2007 ? S/p L CCA stenting in 2011 2/2 restenosis ? S/p L ICA stenting 07/2019 2/2 restenosis   Chronic Diastolic CHF  Hx of CVA   Hypertension   Hyperlipidemia   Chronic kidney disease 3  COPD  [name] was noted to be in atrial fibrillation during his most recent carotid artery intervention.  He was placed on Apixaban.  His HR as uncontrolled at times on a 3 day Zio patch.  His beta-blocker was adjusted but he remained symptomatic.  He therefore underwent DCCV on 11/06/2019 with restoration of normal sinus rhythm.  He then saw Dr. Burt Knack on 12/7 with symptoms of acute diastolic CHF.  He remained in sinus rhythm.  He was started on Furosemide and potassium.  His ARB/HCTZ was held for now.   He returns for follow-up.  His weight is down 9 pounds since last visit.  He notes marginal improvement in his shortness of breath.  He has not had orthopnea, PND.  He continues to have leg swelling.  He has not had chest discomfort.  He had one episode of lightheadedness while laying down.  He has not had any syncope.    Prior CV studies:   The following studies were reviewed today:  3 day Zio Monitor 09/2019 Avg HR 93 (max HR 178) 2 episodes of NSVT (longest 4 beats)  Echocardiogram 08/27/2019 EF 55-60, mild LVH, mod LAE, mild MAC, trace MR, mod TR, RVSP 27.4  Carotid US 08/15/2019 Summary: Right Carotid: Velocities in the right ICA are consistent with a 1-39%  stenosis. Non-hemodynamically significant plaque <50% noted in the CCA. The ECA appears <50% stenosed. Left Carotid: There is no evidence of stenosis in the left ICA. Patent left common carotid artery stent with no evidence of restenosis. Vertebrals: Right vertebral artery demonstrates antegrade flow. Left vertebral artery demonstrates high resistant flow. Subclavians: Normal flow hemodynamics were seen in bilateral subclavian arteries.  Myoview 02/04/15 Low risk stress nuclear study with small-sized, mild intensity fixed apical perfusion defect (which is actually worse at rest than stress), likely attenuation artifact. LV Ejection Fraction: 60%.  Past Medical History:  Diagnosis Date  . CKD (chronic kidney disease), stage III   . Dyspnea on exertion   . Edema   . HTN (hypertension)   . Hyperlipidemia   . Left carotid stenosis    a. 2007 s/p L CEA;  b. 2011 s/p L common carotid stenting 2/2 restenosis;  c. 06/3531 u/s: RICA <99, LICA 24-26%, patent LCCA stent.  [name] PAD (peripheral artery disease) (HCC)    a. s/p prior RSFA, REIA, distal LSFA (known to be occluded), LEIA, and LCIA stenting;  b. 01/2015 ABI's: R 0.78, L 0.53.  [name] Stroke [facility])    Surgical Hx: The patient  has a past surgical history that includes Lumbar disc surgery; carotid endarterecotomy; stnt; external iliac; lumbar back surgery; Tonsillectomy; Tendon repair; abdominal  aortagram (N/A, 02/05/2015); AORTIC ARCH ANGIOGRAPHY (N/A, 07/19/2019); CAROTID ANGIOGRAPHY (N/A, 07/19/2019); CAROTID PTA/STENT INTERVENTION (Left, 08/02/2019); and Cardioversion (N/A, 11/06/2019).   Current Medications: Current Meds  Medication Sig  . albuterol (VENTOLIN HFA) 108 (90 Base) MCG/ACT inhaler Inhale 2 puffs into the lungs every 6 (six) hours as needed (wheezing/shortness of breath).   [name] amLODipine (NORVASC) 5 MG tablet Take 1 tablet (5 mg total) by mouth daily.  [name] apixaban  (ELIQUIS) 5 MG TABS tablet Take 1 tablet (5 mg total) by mouth 2 (two) times daily.  [name] aspirin EC 81 MG tablet Take 1 tablet (81 mg total) by mouth daily.  [name] azelastine (ASTELIN) 0.1 % nasal spray Place 1-2 sprays into both nostrils 2 (two) times daily as needed for rhinitis. Use in each nostril as directed  . carvedilol (COREG) 12.5 MG tablet Take 1 tablet (12.5 mg total) by mouth 2 (two) times daily.  [name] omeprazole (PRILOSEC) 20 MG capsule Take 20 mg by mouth daily with breakfast.   . oxyCODONE-acetaminophen (PERCOCET) 7.5-325 MG tablet Take 0.5 tablets by mouth every 5 (five) hours as needed for severe pain.   . potassium chloride (KLOR-CON) 10 MEQ tablet Take 1 tablet (10 mEq total) by mouth daily.  . rosuvastatin (CRESTOR) 10 MG tablet Take 10 mg by mouth every evening.   . TRELEGY ELLIPTA 100-62.5-25 MCG/INH AEPB Inhale 1 puff into the lungs at bedtime.   . [DISCONTINUED] furosemide (LASIX) 40 MG tablet Take 1 tablet (40 mg total) by mouth daily.     Allergies:   Plavix [clopidogrel]   Social History   Tobacco Use  . Smoking status: Current Every Day Smoker    Packs/day: 1.00    Years: 50.00    Pack years: 50.00    Types: Cigarettes  . Smokeless tobacco: Never Used  Substance Use Topics  . Alcohol use: Yes    Alcohol/week: 7.0 standard drinks    Types: 7 Glasses of wine per week  . Drug use: No     Family Hx: The patient's family history includes Hyperlipidemia in his mother; Hypertension in his mother.  ROS:   Please see the history of present illness.    ROS All other systems reviewed and are negative.   EKGs/Labs/Other Test Reviewed:    EKG:  EKG is  ordered today.  The ekg ordered today demonstrates sinus bradycardia, HR 54, normal axis, QTC 411, nonspecific ST-T wave changes, no change from prior tracing  Recent Labs: 10/29/2019: BUN 28; Creatinine, Ser 1.96; Hemoglobin 14.2; Platelets 168; Potassium 4.6; Sodium 136   Recent Lipid Panel Lab Results  Component  Value Date/Time   CHOL 194 03/13/2012 08:36 AM   TRIG 341.0 (H) 03/13/2012 08:36 AM   HDL 35.90 (L) 03/13/2012 08:36 AM   CHOLHDL 5 03/13/2012 08:36 AM   LDLDIRECT 98.5 03/13/2012 08:36 AM    Physical Exam:    VS:  BP (!) 146/70   Pulse (!) 54   Ht _0  (1.88 m)   Wt 220 lb (99.8 kg)   SpO2 96%   BMI 28.25 kg/m     Wt Readings from Last 3 Encounters:  11/25/19 220 lb (99.8 kg)  11/11/19 229 lb 6.4 oz (104.1 kg)  11/06/19 223 lb (101.2 kg)     Physical Exam  Constitutional: He is oriented to person, place, and time. He appears well-developed and well-nourished. No distress.  HENT:  Head: Normocephalic and atraumatic.  Eyes: No scleral icterus.  Neck: No JVD present. No thyromegaly  present.  Cardiovascular: Normal rate, regular rhythm and normal heart sounds.  No murmur heard. Pulmonary/Chest: Effort normal and breath sounds normal. He has no rales.  Abdominal: Soft. He exhibits distension. There is no hepatomegaly.  Musculoskeletal:        General: Edema (2+ bilat LE edema) present.  Lymphadenopathy:    He has no cervical adenopathy.  Neurological: He is alert and oriented to person, place, and time.  Skin: Skin is warm and dry.  Psychiatric: He has a normal mood and affect.    ASSESSMENT & PLAN:    1.  Acute diastolic CHF (congestive heart failure) (HCC) He was recently seen by Dr. Burt Knack and noted to be volume overloaded.  He was placed on Furosemide.  Since then, his weight is gone down about 9 pounds.  He has not really been that active.  He notes marginal improvement in his shortness of breath.  He appears to still have some volume to lose.  I will increase his furosemide to 60 mg daily.  Obtain a BMET, BNP today.  If his BNP is significantly elevated, I will adjust his furosemide to 40 mg twice a day.  Repeat BMET in 1 week.  I will see him back in close follow-up in 2 weeks  2. Persistent atrial fibrillation ([place]) He is s/p DCCV in 11/2019.  He remains in sinus  rhythm.  His heart rate is in the mid 50s.  He did have an episode of lightheadedness the other day while lying in bed.  I have asked him to contact us if this happens again.  In that case, I would decrease his carvedilol to 6.25 mg twice daily and obtain a follow-up monitor to rule out significant pauses.  Continue current dose of Apixaban.  3. Essential hypertension Blood pressure above target.  Increase furosemide as noted.  Consider resuming ARB at follow-up versus increasing amlodipine.  4. Mixed hyperlipidemia Continue statin Rx.     5. Stage 3a chronic kidney disease Recent creatinine stable (1.96 on 10/29/2019).  Repeat BMET will be obtained today.  6. Left-sided carotid artery disease, unspecified type ([place]) FU with Dr. Oneida Alar as planned.        Dispo:  Return in about 2 weeks (around 12/09/2019) for Close Follow Up, w/ [name], PA-C, in person.   Medication Adjustments/Labs and Tests Ordered: Current medicines are reviewed at length with the patient today.  Concerns regarding medicines are outlined above.  Tests Ordered: Orders Placed This Encounter  Procedures  . Basic metabolic panel  . Pro b natriuretic peptide  . Basic metabolic panel  . EKG 12-Lead   Medication Changes: Meds ordered this encounter  Medications  . furosemide (LASIX) 40 MG tablet    Sig: Take 1.5 tablets (60 mg total) by mouth daily.    Dispense:  135 tablet    Refill:  3    Signed, [name], PA-C  11/25/2019 7:56 PM    [facility] [place], [place], [place]  [address] Phone: [phone]; Fax: [phone]

## 2019-11-25 ENCOUNTER — Other Ambulatory Visit: Payer: Self-pay

## 2019-11-25 ENCOUNTER — Ambulatory Visit (INDEPENDENT_AMBULATORY_CARE_PROVIDER_SITE_OTHER): Payer: Medicare Other | Admitting: Physician Assistant

## 2019-11-25 ENCOUNTER — Encounter: Payer: Self-pay | Admitting: Physician Assistant

## 2019-11-25 VITALS — BP 146/70 | HR 54 | Ht 74.0 in | Wt 220.0 lb

## 2019-11-25 DIAGNOSIS — I5031 Acute diastolic (congestive) heart failure: Secondary | ICD-10-CM

## 2019-11-25 DIAGNOSIS — I779 Disorder of arteries and arterioles, unspecified: Secondary | ICD-10-CM

## 2019-11-25 DIAGNOSIS — I10 Essential (primary) hypertension: Secondary | ICD-10-CM | POA: Diagnosis not present

## 2019-11-25 DIAGNOSIS — I4819 Other persistent atrial fibrillation: Secondary | ICD-10-CM | POA: Diagnosis not present

## 2019-11-25 DIAGNOSIS — E782 Mixed hyperlipidemia: Secondary | ICD-10-CM | POA: Diagnosis not present

## 2019-11-25 DIAGNOSIS — N1831 Chronic kidney disease, stage 3a: Secondary | ICD-10-CM

## 2019-11-25 MED ORDER — FUROSEMIDE 40 MG PO TABS: 60.0000 mg | ORAL_TABLET | Freq: Every day | ORAL | 3 refills | Status: DC

## 2019-11-25 NOTE — Patient Instructions (Signed)
Medication Instructions:  Your physician has recommended you make the following change in your medication:  1.  INCREASE the Lasix to 40 mg taking 1 1/2 tablet daily  *If you need a refill on your cardiac medications before your next appointment, please call your pharmacy*  Lab Work: TODAY:  BMET & PRO BNP 12/03/2019:  COME TO THE LAB FOR BLOOD WORK ANYTIME THIS MORNING  If you have labs (blood work) drawn today and your tests are completely normal, you will receive your results only by: [name] MyChart Message (if you have MyChart) OR . A paper copy in the mail If you have any lab test that is abnormal or we need to change your treatment, we will call you to review the results.  Testing/Procedures: None ordered  Follow-Up: At [facility], you and your health needs are our priority.  As part of our continuing mission to provide you with exceptional heart care, we have created designated Provider Care Teams.  These Care Teams include your primary Cardiologist (physician) and Advanced Practice Providers (APPs -  Physician Assistants and Nurse Practitioners) who all work together to provide you with the care you need, when you need it.  Your next appointment:   You are scheduled to come back to the office to see Cooper Render, PA-C, 12/10/2019  Arrive at 1:30 for registration

## 2019-11-26 ENCOUNTER — Telehealth: Payer: Self-pay

## 2019-11-26 LAB — BASIC METABOLIC PANEL
BUN/Creatinine Ratio: 12 (ref 10–24)
BUN: 27 mg/dL (ref 8–27)
CO2: 20 mmol/L (ref 20–29)
Calcium: 8.8 mg/dL (ref 8.6–10.2)
Chloride: 100 mmol/L (ref 96–106)
Creatinine, Ser: 2.28 mg/dL — ABNORMAL HIGH (ref 0.76–1.27)
GFR calc Af Amer: 31 mL/min/{1.73_m2} — ABNORMAL LOW (ref >59)
GFR calc non Af Amer: 27 mL/min/{1.73_m2} — ABNORMAL LOW (ref >59)
Glucose: 88 mg/dL (ref 65–99)
Potassium: 4.3 mmol/L (ref 3.5–5.2)
Sodium: 138 mmol/L (ref 134–144)

## 2019-11-26 LAB — PRO B NATRIURETIC PEPTIDE: NT-Pro BNP: 2361 pg/mL — ABNORMAL HIGH (ref 0–486)

## 2019-11-26 NOTE — Telephone Encounter (Signed)
Spoke with pt, advised of lab results.  Pt v/u to increase Lasix to 40mg  BID x3 days. After 3 days start 60mg  daily. Repeat labs next week.  Lab appt on 12/29  Confirmed.

## 2019-11-26 NOTE — Telephone Encounter (Signed)
-----   Message from Liliane Shi, [place] sent at 11/26/2019  1:15 PM EST ----- BNP elevated. Creatinine slightly increased. K+ normal.  PLAN:  - Increase Lasix to 40 mg twice daily x 3 more days  - Then reduce back to 60 mg once daily   - Repeat BMET should already be scheduled for next week. [name], PA-C    11/26/2019 1:14 PM

## 2019-12-02 ENCOUNTER — Telehealth: Payer: Self-pay | Admitting: Cardiovascular Disease

## 2019-12-02 NOTE — Telephone Encounter (Signed)
New Message     Pt is calling and says he had his labs done at his PCP today 12/02/19

## 2019-12-02 NOTE — Telephone Encounter (Signed)
I will send as FYI to [name], PAC. Pt has upcoming appt 12/10/19 with [name], PAC.

## 2019-12-03 ENCOUNTER — Other Ambulatory Visit: Payer: Medicare Other

## 2019-12-03 NOTE — Telephone Encounter (Signed)
Reviewed recent labs drawn by PCP. Hgb 14.2 Creatinine 2.0 K+ 4.2 PLAN:  Renal function stable. Continue current medications/treatment plan and follow up as scheduled.  [name], PA-C    12/03/2019 5:12 PM

## 2019-12-04 NOTE — Telephone Encounter (Signed)
I called and spoke with patient, he is aware that [facility] received and reviewed lab work drawn by PCP; he is aware that per Nicki Reaper to continue current medications and treatment plan and to follow up on 12/10/19 as scheduled.

## 2019-12-10 ENCOUNTER — Other Ambulatory Visit: Payer: Self-pay

## 2019-12-10 ENCOUNTER — Encounter: Payer: Self-pay | Admitting: Physician Assistant

## 2019-12-10 ENCOUNTER — Ambulatory Visit (INDEPENDENT_AMBULATORY_CARE_PROVIDER_SITE_OTHER): Payer: Medicare Other | Admitting: Physician Assistant

## 2019-12-10 VITALS — BP 140/62 | HR 57 | Ht 74.0 in | Wt 225.0 lb

## 2019-12-10 DIAGNOSIS — N1831 Chronic kidney disease, stage 3a: Secondary | ICD-10-CM | POA: Diagnosis not present

## 2019-12-10 DIAGNOSIS — I10 Essential (primary) hypertension: Secondary | ICD-10-CM | POA: Diagnosis not present

## 2019-12-10 DIAGNOSIS — I4819 Other persistent atrial fibrillation: Secondary | ICD-10-CM

## 2019-12-10 DIAGNOSIS — I5031 Acute diastolic (congestive) heart failure: Secondary | ICD-10-CM | POA: Diagnosis not present

## 2019-12-10 NOTE — Progress Notes (Signed)
Cardiology Office Note:    Date:  12/10/2019   ID:  [name], DOB 12/11/1945, MRN [id number]  PCP:  Raelene Bott, MD  Cardiologist:  Sherren Mocha, MD   Electrophysiologist:  None   Referring MD: Raelene Bott, MD   Chief Complaint  Patient presents with  . Follow-up    CHF, AFib     History of Present Illness:    [name] is a 74 y.o. male with:   Persistent Atrial fibrillation ? CHADS2-VASc=5 (age x 1, vascular dz, HTN, CVA) >>Apixaban  ? S/p DCCV 11/2019 >> NSR  PAD  Carotid artery disease (managed by Dr. Oneida Alar) ? S/p L CEA 2007 ? S/p L CCA stenting in 2011 2/2 restenosis ? S/p L ICA stenting 07/2019 2/2 restenosis   Chronic Diastolic CHF  Hx of CVA   Hypertension   Hyperlipidemia   Chronic kidney disease 3  COPD  [name] underwent DCCV in 11/2019 with restoration of normal sinus rhythm.  He has since developed a/c diastolic CHF.  He was last seen 11/25/2019.  He was improved at that time but still volume overloaded.  I adjusted his Furosemide further.    He returns for follow up.  He saw his PCP recently for a flare up of gout.  He was placed on Prednisone.  His legs were more swollen and his Furosemide was increased to 60 mg twice daily.  He has noted improved swelling since and his breathing is better.  He has not had chest pain, orthopnea, syncope.     Prior CV studies:   The following studies were reviewed today:  3 day Zio Monitor 09/2019 Avg HR 93 (max HR 178) 2 episodes of NSVT (longest 4 beats)  Echocardiogram 08/27/2019 EF 55-60, mild LVH, mod LAE, mild MAC, trace MR, mod TR, RVSP 27.4  Carotid US 08/15/2019 Summary: Right Carotid: Velocities in the right ICA are consistent with a 1-39% stenosis. Non-hemodynamically significant plaque <50% noted in the CCA. The ECA appears <50% stenosed. Left Carotid: There is no evidence of stenosis in the left ICA. Patent left common  carotid artery stent with no evidence of restenosis. Vertebrals: Right vertebral artery demonstrates antegrade flow. Left vertebral artery demonstrates high resistant flow. Subclavians: Normal flow hemodynamics were seen in bilateral subclavian arteries.  Myoview 02/04/15 Low risk stress nuclear study with small-sized, mild intensity fixed apical perfusion defect (which is actually worse at rest than stress), likely attenuation artifact. LV Ejection Fraction: 60%.  Past Medical History:  Diagnosis Date  . CKD (chronic kidney disease), stage III   . Dyspnea on exertion   . Edema   . HTN (hypertension)   . Hyperlipidemia   . Left carotid stenosis    a. 2007 s/p L CEA;  b. 2011 s/p L common carotid stenting 2/2 restenosis;  c. 05/2830 u/s: RICA <51, LICA 76-16%, patent LCCA stent.  [name] PAD (peripheral artery disease) (HCC)    a. s/p prior RSFA, REIA, distal LSFA (known to be occluded), LEIA, and LCIA stenting;  b. 01/2015 ABI's: R 0.78, L 0.53.  [name] Stroke [facility])    Surgical Hx: The patient  has a past surgical history that includes Lumbar disc surgery; carotid endarterecotomy; stnt; external iliac; lumbar back surgery; Tonsillectomy; Tendon repair; abdominal aortagram (N/A, 02/05/2015); AORTIC ARCH ANGIOGRAPHY (N/A, 07/19/2019); CAROTID ANGIOGRAPHY (N/A, 07/19/2019); CAROTID PTA/STENT INTERVENTION (Left, 08/02/2019); and Cardioversion (N/A, 11/06/2019).   Current Medications: Current Meds  Medication Sig  . albuterol (VENTOLIN HFA) 108 (90 Base) MCG/ACT  inhaler Inhale 2 puffs into the lungs every 6 (six) hours as needed (wheezing/shortness of breath).   [name] amLODipine (NORVASC) 5 MG tablet Take 1 tablet (5 mg total) by mouth daily.  [name] apixaban (ELIQUIS) 5 MG TABS tablet Take 1 tablet (5 mg total) by mouth 2 (two) times daily.  [name] aspirin EC 81 MG tablet Take 1 tablet (81 mg total) by mouth daily.  [name] azelastine (ASTELIN) 0.1 % nasal spray Place 1-2 sprays into both nostrils 2  (two) times daily as needed for rhinitis. Use in each nostril as directed  . carvedilol (COREG) 12.5 MG tablet Take 1 tablet (12.5 mg total) by mouth 2 (two) times daily.  . furosemide (LASIX) 40 MG tablet Take 1.5 tablets (60 mg total) by mouth daily.  [name] omeprazole (PRILOSEC) 20 MG capsule Take 20 mg by mouth daily with breakfast.   . oxyCODONE-acetaminophen (PERCOCET) 7.5-325 MG tablet Take 0.5 tablets by mouth every 5 (five) hours as needed for severe pain.   . potassium chloride (KLOR-CON) 10 MEQ tablet Take 1 tablet (10 mEq total) by mouth daily.  . rosuvastatin (CRESTOR) 10 MG tablet Take 10 mg by mouth every evening.   . TRELEGY ELLIPTA 100-62.5-25 MCG/INH AEPB Inhale 1 puff into the lungs at bedtime.      Allergies:   Plavix [clopidogrel]   Social History   Tobacco Use  . Smoking status: Current Every Day Smoker    Packs/day: 1.00    Years: 50.00    Pack years: 50.00    Types: Cigarettes  . Smokeless tobacco: Never Used  Substance Use Topics  . Alcohol use: Yes    Alcohol/week: 7.0 standard drinks    Types: 7 Glasses of wine per week  . Drug use: No     Family Hx: The patient's family history includes Hyperlipidemia in his mother; Hypertension in his mother.  ROS:   Please see the history of present illness.    ROS All other systems reviewed and are negative.   EKGs/Labs/Other Test Reviewed:    EKG:  EKG is not ordered today.  The ekg ordered today demonstrates n/a  Recent Labs: 10/29/2019: Hemoglobin 14.2; Platelets 168 11/25/2019: BUN 27; Creatinine, Ser 2.28; NT-Pro BNP 2,361; Potassium 4.3; Sodium 138   Recent Lipid Panel Lab Results  Component Value Date/Time   CHOL 194 03/13/2012 08:36 AM   TRIG 341.0 (H) 03/13/2012 08:36 AM   HDL 35.90 (L) 03/13/2012 08:36 AM   CHOLHDL 5 03/13/2012 08:36 AM   LDLDIRECT 98.5 03/13/2012 08:36 AM    Physical Exam:    VS:  BP 140/62   Pulse (!) 57   Ht _0  (1.88 m)   Wt 225 lb (102.1 kg)   SpO2 98%   BMI 28.89  kg/m     Wt Readings from Last 3 Encounters:  12/10/19 225 lb (102.1 kg)  11/25/19 220 lb (99.8 kg)  11/11/19 229 lb 6.4 oz (104.1 kg)     Physical Exam  Constitutional: He is oriented to person, place, and time. He appears well-developed and well-nourished. No distress.  HENT:  Head: Normocephalic and atraumatic.  Eyes: No scleral icterus.  Neck: No JVD present. No thyromegaly present.  Cardiovascular: Normal rate, regular rhythm and normal heart sounds.  No murmur heard. Pulmonary/Chest: Effort normal and breath sounds normal. He has no rales.  Abdominal: Soft. There is no hepatomegaly.  Musculoskeletal:        General: Edema (1+  b/l leg edema) present.  Lymphadenopathy:  He has no cervical adenopathy.  Neurological: He is alert and oriented to person, place, and time.  Skin: Skin is warm and dry.  Psychiatric: He has a normal mood and affect.    ASSESSMENT & PLAN:    1. Acute diastolic CHF (congestive heart failure) (HCC) Volume status improved.  Continue Furosemide 60 mg twice daily.  Obtain BMET today.  FU 3 mos.    2. Stage 3a chronic kidney disease Recent Creatinine 2.28.  Continue Furosemide 60 mg twice daily. BMET today.   3. Persistent atrial fibrillation (HCC) Maintaining normal sinus rhythm by exam.  Continue Apixaban.    4. Essential hypertension BP borderline. Continue current regimen.  Continue to monitor.      Dispo:  Return in about 3 months (around 03/09/2020) for Routine Follow Up, w/ Dr. Burt Knack, or [name], PA-C, (virtual or in-person).   Medication Adjustments/Labs and Tests Ordered: Current medicines are reviewed at length with the patient today.  Concerns regarding medicines are outlined above.  Tests Ordered: Orders Placed This Encounter  Procedures  . Basic Metabolic Panel (BMET)   Medication Changes: No orders of the defined types were placed in this encounter.   Signed, [name], PA-C  12/10/2019 2:23 PM    [facility] [place], [place], [place]  [address] Phone: [phone]; Fax: [phone]

## 2019-12-10 NOTE — Patient Instructions (Signed)
Medication Instructions:   *If you need a refill on your cardiac medications before your next appointment, please call your pharmacy*  Lab Work: Your physician recommends that you have lab work today-BMET If you have labs (blood work) drawn today and your tests are completely normal, you will receive your results only by: [name] MyChart Message (if you have MyChart) OR . A paper copy in the mail If you have any lab test that is abnormal or we need to change your treatment, we will call you to review the results.  Testing/Procedures: None ordered today.  Follow-Up: At [facility], you and your health needs are our priority.  As part of our continuing mission to provide you with exceptional heart care, we have created designated Provider Care Teams.  These Care Teams include your primary Cardiologist (physician) and Advanced Practice Providers (APPs -  Physician Assistants and Nurse Practitioners) who all work together to provide you with the care you need, when you need it.  Your next appointment:   3 month(s)  The format for your next appointment:   In Person  Provider:   You may see Sherren Mocha, MD or one of the following Advanced Practice Providers on your designated Care Team:    [name], PA-C  [place], [place]  Daune Perch, [place]

## 2019-12-11 LAB — BASIC METABOLIC PANEL
BUN/Creatinine Ratio: 21 (ref 10–24)
BUN: 37 mg/dL — ABNORMAL HIGH (ref 8–27)
CO2: 24 mmol/L (ref 20–29)
Calcium: 8.4 mg/dL — ABNORMAL LOW (ref 8.6–10.2)
Chloride: 102 mmol/L (ref 96–106)
Creatinine, Ser: 1.8 mg/dL — ABNORMAL HIGH (ref 0.76–1.27)
GFR calc Af Amer: 42 mL/min/{1.73_m2} — ABNORMAL LOW (ref >59)
GFR calc non Af Amer: 36 mL/min/{1.73_m2} — ABNORMAL LOW (ref >59)
Glucose: 76 mg/dL (ref 65–99)
Potassium: 4.2 mmol/L (ref 3.5–5.2)
Sodium: 140 mmol/L (ref 134–144)

## 2019-12-23 ENCOUNTER — Other Ambulatory Visit: Payer: Self-pay | Admitting: Physician Assistant

## 2019-12-23 MED ORDER — FUROSEMIDE 40 MG PO TABS: 60.0000 mg | ORAL_TABLET | Freq: Two times a day (BID) | ORAL | 3 refills | Status: DC

## 2019-12-23 NOTE — Telephone Encounter (Signed)
Per LOV with [name], PA on 12/10/19, Continue Furosemide 60 mg twice daily, this medication was resent to pt's pharmacy as requested by pt's pharmacy. Confirmation received.

## 2019-12-31 ENCOUNTER — Ambulatory Visit: Payer: 59

## 2020-01-17 ENCOUNTER — Ambulatory Visit: Payer: 59

## 2020-03-19 DIAGNOSIS — R7989 Other specified abnormal findings of blood chemistry: Secondary | ICD-10-CM | POA: Insufficient documentation

## 2020-03-19 DIAGNOSIS — R778 Other specified abnormalities of plasma proteins: Secondary | ICD-10-CM | POA: Insufficient documentation

## 2020-03-19 DIAGNOSIS — Z7901 Long term (current) use of anticoagulants: Secondary | ICD-10-CM | POA: Insufficient documentation

## 2020-03-19 DIAGNOSIS — N179 Acute kidney failure, unspecified: Secondary | ICD-10-CM | POA: Insufficient documentation

## 2020-03-19 DIAGNOSIS — I480 Paroxysmal atrial fibrillation: Secondary | ICD-10-CM

## 2020-03-23 NOTE — Progress Notes (Signed)
Cardiology Office Note   Date:  03/24/2020   ID:  CAVIN LONGMAN, DOB [birth year], MRN [id number]  PCP:  Raelene Bott, MD  Cardiologist: Dr. Burt Knack, MD    Chief Complaint  Patient presents with  . Follow-up      History of Present Illness: [name] is a 74 y.o. male who presents for follow up, seen for Dr. Burt Knack.   [name] has a history of persistent atrial fibrillation on apixaban (status post DCCV 11/2021 normal sinus rhythm), PAD, carotid artery disease managed by Dr. Oneida Alar, chronic diastolic CHF, history of CVA, hypertension, hyperlipidemia, CKD stage III and COPD.  [name] underwent DCCV in 11/2019 with restoration of normal sinus rhythm.  He has since developed acute on chronic diastolic CHF with evidence of fluid volume overload for which he was placed on Lasix therapy with adjustments.   He was last seen by [name] in follow-up at which time he had recently been seen by his PCP for flare of gout and was placed on prednisone therapy.  Given this, his lower extremities were swollen therefore his Lasix was increased to 60 mg p.o. twice daily.  Patient reports that he was recently hospitalized at [facility] for community-acquired pneumonia. His Lasix was stopped at that time secondary to acute on chronic CKD with a discharge creatinine at 2.5.  Appears that his creatinine has been rising over the last several years (1.8> 2.0> 2.4> 2.6> 2.8> 2.5). He reports that he is typically followed annually with [name].  I have advised that he follow-up with him soon for diuretic assistance.  Patient reports that his discharge weight was 226lb and his weight this a.m. was 234lb.  He does not appear to be significantly fluid volume overloaded however he has been without his Lasix for quite some time. Reports that his breathing has improved. Has almost completed his antibiotic course and has fully completed his prednisone course.  Does come today with a significant  chin bruise for which he states he was changing a light bulb in his garage and fell landing on his chin and arm.  Plans to see his PCP tomorrow.  Able to move/grip Left arm and hand with adequate arm strength.  He denies chest pain, LE edema, orthopnea, palpitations, dizziness or syncope.  Past Medical History:  Diagnosis Date  . CKD (chronic kidney disease), stage III   . Dyspnea on exertion   . Edema   . HTN (hypertension)   . Hyperlipidemia   . Left carotid stenosis    a. 2007 s/p L CEA;  b. 2011 s/p L common carotid stenting 2/2 restenosis;  c. 05/4402 u/s: RICA <47, LICA 42-59%, patent LCCA stent.  [name] PAD (peripheral artery disease) (HCC)    a. s/p prior RSFA, REIA, distal LSFA (known to be occluded), LEIA, and LCIA stenting;  b. 01/2015 ABI's: R 0.78, L 0.53.  [name] Stroke [facility])     Past Surgical History:  Procedure Laterality Date  . ABDOMINAL AORTAGRAM N/A 02/05/2015   Procedure: ABDOMINAL Maxcine Ham;  Surgeon: Conrad Emerald Bay, MD;  Location: [facility] CATH LAB;  Service: Cardiovascular;  Laterality: N/A;  . AORTIC ARCH ANGIOGRAPHY N/A 07/19/2019   Procedure: AORTIC ARCH ANGIOGRAPHY;  Surgeon: Elam Dutch, MD;  Location: [place] CV LAB;  Service: Cardiovascular;  Laterality: N/A;  . CARDIOVERSION N/A 11/06/2019   Procedure: CARDIOVERSION;  Surgeon: Pixie Casino, MD;  Location: [place];  Service: Cardiovascular;  Laterality: N/A;  .  CAROTID ANGIOGRAPHY N/A 07/19/2019   Procedure: CAROTID ANGIOGRAPHY;  Surgeon: Elam Dutch, MD;  Location: [place] CV LAB;  Service: Cardiovascular;  Laterality: N/A;  . carotid endarterecotomy    . CAROTID PTA/STENT INTERVENTION Left 08/02/2019   Procedure: CAROTID PTA/STENT INTERVENTION;  Surgeon: Elam Dutch, MD;  Location: [place] CV LAB;  Service: Cardiovascular;  Laterality: Left;  . external iliac     status post bilateral and left common iliac artery  . lumbar back surgery     x2  . LUMBAR DISC SURGERY     x 2  . stnt      lower extermity  . TENDON REPAIR     to the left arm  . TONSILLECTOMY       Current Outpatient Medications  Medication Sig Dispense Refill  . albuterol (VENTOLIN HFA) 108 (90 Base) MCG/ACT inhaler Inhale 2 puffs into the lungs every 6 (six) hours as needed (wheezing/shortness of breath).     [name] amLODipine (NORVASC) 10 MG tablet Take 10 mg by mouth daily.    [name] apixaban (ELIQUIS) 5 MG TABS tablet Take 1 tablet (5 mg total) by mouth 2 (two) times daily. 180 tablet 3  . aspirin EC 81 MG tablet Take 1 tablet (81 mg total) by mouth daily. 90 tablet 3  . azelastine (ASTELIN) 0.1 % nasal spray Place 1-2 sprays into both nostrils 2 (two) times daily as needed for rhinitis. Use in each nostril as directed    . carvedilol (COREG) 12.5 MG tablet Take 1 tablet (12.5 mg total) by mouth 2 (two) times daily. 90 tablet 3  . losartan-hydrochlorothiazide (HYZAAR) 50-12.5 MG tablet Take 1 tablet by mouth daily.    [name] omeprazole (PRILOSEC) 20 MG capsule Take 20 mg by mouth daily with breakfast.     . oxyCODONE-acetaminophen (PERCOCET) 7.5-325 MG tablet Take 0.5 tablets by mouth every 5 (five) hours as needed for severe pain.     . potassium chloride (KLOR-CON) 10 MEQ tablet Take 1 tablet (10 mEq total) by mouth daily. 90 tablet 3  . rosuvastatin (CRESTOR) 10 MG tablet Take 10 mg by mouth every evening.     . TRELEGY ELLIPTA 100-62.5-25 MCG/INH AEPB Inhale 1 puff into the lungs at bedtime.      No current facility-administered medications for this visit.    Allergies:   Plavix [clopidogrel]    Social History:  The patient  reports that he has been smoking cigarettes. He has a 50.00 pack-year smoking history. He has never used smokeless tobacco. He reports current alcohol use of about 7.0 standard drinks of alcohol per week. He reports that he does not use drugs.   Family History:  The patient's family history includes Hyperlipidemia in his mother; Hypertension in his mother.    ROS:  Please see the history  of present illness.   Otherwise, review of systems are positive for none.  All other systems are reviewed and negative.    PHYSICAL EXAM: VS:  BP (!) 162/68   Pulse 70   Ht _0  (1.88 m)   Wt 234 lb 6.4 oz (106.3 kg)   SpO2 96%   BMI 30.10 kg/m  , BMI Body mass index is 30.1 kg/m.   General: Well developed, well nourished, NAD Skin: Warm, dry, intact  Lungs:Clear to ausculation bilaterally. Breathing is unlabored. Cardiovascular: RRR with S1 S2. No murmurs, rubs, gallops, or LV heave appreciated. Extremities: No edema. Radial pulses 2+ bilaterally Neuro: Alert and oriented. No focal  deficits. No facial asymmetry. MAE spontaneously. Psych: Responds to questions appropriately with normal affect.     EKG:  EKG is not ordered today.   Recent Labs: 10/29/2019: Hemoglobin 14.2; Platelets 168 11/25/2019: NT-Pro BNP 2,361 12/10/2019: BUN 37; Creatinine, Ser 1.80; Potassium 4.2; Sodium 140   Lipid Panel    Component Value Date/Time   CHOL 194 03/13/2012 0836   TRIG 341.0 (H) 03/13/2012 0836   HDL 35.90 (L) 03/13/2012 0836   CHOLHDL 5 03/13/2012 0836   VLDL 68.2 (H) 03/13/2012 0836   LDLDIRECT 98.5 03/13/2012 0836   Wt Readings from Last 3 Encounters:  03/24/20 234 lb 6.4 oz (106.3 kg)  12/10/19 225 lb (102.1 kg)  11/25/19 220 lb (99.8 kg)     Other studies Reviewed: Additional studies/ records that were reviewed today include:    3 day Zio Monitor 09/2019 Avg HR 93 (max HR 178) 2 episodes of NSVT (longest 4 beats)  Echocardiogram 08/27/2019 EF 55-60, mild LVH, mod LAE, mild MAC, trace MR, mod TR, RVSP 27.4  Carotid US 08/15/2019 Summary: Right Carotid: Velocities in the right ICA are consistent with a 1-39% stenosis. Non-hemodynamically significant plaque <50% noted in the CCA. The ECA appears <50% stenosed. Left Carotid: There is no evidence of stenosis in the left ICA. Patent left common carotid artery stent with no  evidence of restenosis. Vertebrals: Right vertebral artery demonstrates antegrade flow. Left vertebral artery demonstrates high resistant flow. Subclavians: Normal flow hemodynamics were seen in bilateral subclavian arteries.  Myoview 02/04/15 Low risk stress nuclear study with small-sized, mild intensity fixed apical perfusion defect (which is actually worse at rest than stress), likely attenuation artifact. LV Ejection Fraction: 60%.  ASSESSMENT AND PLAN:  1. Chronic diastolic CHF: -Appears euvolemic on exam -Lasix recently discontinued during hospitalization while being treated for COPD exacerbation secondary to community-acquired pneumonia -We will restart low-dose Lasix given weight gain however will need to follow creatinine closely.  Was elevated during hospital course however looking back at his more recent lab work this is been uptrending for some time -Advised that he follow with [name] soon for diuretic assistance -BMET in one week  -Discharge weight at 226lb on Friday>> weight today 234lb  2.  CKD stage III: -Last creatinine, 2.5 at hospital discharge -Lasix 20 mg twice daily (previously on Lasix 60 mg twice daily) -Lab work today  3.  Persistent atrial fibrillation: -Maintaining NSR status post DCCV  -Continue Eliquis -No signs or symptoms of acute bleeding in stool or urine  4. Essential hypertension: -Elevated today, 162/68 however reports that this was running much lower during his hospital course -Plans to recheck and follow trend at home>.close follow up -May need up titration of current medications -Continue current regimen  5.  Recent COPD exacerbation secondary to CAP: -Treated at [facility] x2 days with antibiotics and prednisone therapies -Reports he is feeling well since hospital discharge -No home O2 needed -Follow up with PCP tomorrow    Current medicines are reviewed at length with the patient today.  The patient  does not have concerns regarding medicines.  The following changes have been made:  Decrease Lasix to 50m PO BID   Labs/ tests ordered today include: BMET in one week  No orders of the defined types were placed in this encounter.   Disposition:   FU with SRichardson Dopp PA in 1 months  Signed, JKathyrn Drown NP  03/24/2020 10:21 AM    [email or web address]  [place], [place]  [address] Phone: [phone]; Fax: [phone]

## 2020-03-24 ENCOUNTER — Ambulatory Visit (INDEPENDENT_AMBULATORY_CARE_PROVIDER_SITE_OTHER): Payer: Medicare Other | Admitting: Cardiology

## 2020-03-24 ENCOUNTER — Encounter: Payer: Self-pay | Admitting: Cardiology

## 2020-03-24 ENCOUNTER — Other Ambulatory Visit: Payer: Self-pay

## 2020-03-24 ENCOUNTER — Ambulatory Visit: Payer: 59 | Admitting: Physician Assistant

## 2020-03-24 VITALS — BP 162/68 | HR 70 | Ht 74.0 in | Wt 234.4 lb

## 2020-03-24 DIAGNOSIS — N1831 Chronic kidney disease, stage 3a: Secondary | ICD-10-CM | POA: Diagnosis not present

## 2020-03-24 DIAGNOSIS — Z79899 Other long term (current) drug therapy: Secondary | ICD-10-CM

## 2020-03-24 DIAGNOSIS — I10 Essential (primary) hypertension: Secondary | ICD-10-CM

## 2020-03-24 DIAGNOSIS — I4819 Other persistent atrial fibrillation: Secondary | ICD-10-CM

## 2020-03-24 DIAGNOSIS — I5032 Chronic diastolic (congestive) heart failure: Secondary | ICD-10-CM

## 2020-03-24 MED ORDER — FUROSEMIDE 20 MG PO TABS: 20.0000 mg | ORAL_TABLET | Freq: Two times a day (BID) | ORAL | 1 refills | Status: DC

## 2020-03-24 NOTE — Patient Instructions (Addendum)
Medication Instructions:   Your physician has recommended you make the following change in your medication:   1) Restart Lasix 20 mg, 1 tablet by mouth twice a day  *If you need a refill on your cardiac medications before your next appointment, please call your pharmacy*  Lab Work:  Your physician recommends that you return for lab work in: 1 week on 03/31/20  Testing/Procedures:  None ordered today  Follow-Up: At [facility], you and your health needs are our priority.  As part of our continuing mission to provide you with exceptional heart care, we have created designated Provider Care Teams.  These Care Teams include your primary Cardiologist (physician) and Advanced Practice Providers (APPs -  Physician Assistants and Nurse Practitioners) who all work together to provide you with the care you need, when you need it.  We recommend signing up for the patient portal called "MyChart".  Sign up information is provided on this After Visit Summary.  MyChart is used to connect with patients for Virtual Visits (Telemedicine).  Patients are able to view lab/test results, encounter notes, upcoming appointments, etc.  Non-urgent messages can be sent to your provider as well.   To learn more about what you can do with MyChart, go to [email or web address].    Your next appointment:    On 05/05/20 at 10:15AM with [name], PA-C

## 2020-03-31 ENCOUNTER — Other Ambulatory Visit: Payer: Self-pay

## 2020-03-31 ENCOUNTER — Other Ambulatory Visit: Payer: Medicare Other

## 2020-03-31 DIAGNOSIS — N1831 Chronic kidney disease, stage 3a: Secondary | ICD-10-CM

## 2020-04-01 LAB — BASIC METABOLIC PANEL
BUN/Creatinine Ratio: 11 (ref 10–24)
BUN: 24 mg/dL (ref 8–27)
CO2: 21 mmol/L (ref 20–29)
Calcium: 8.4 mg/dL — ABNORMAL LOW (ref 8.6–10.2)
Chloride: 105 mmol/L (ref 96–106)
Creatinine, Ser: 2.2 mg/dL — ABNORMAL HIGH (ref 0.76–1.27)
GFR calc Af Amer: 33 mL/min/{1.73_m2} — ABNORMAL LOW (ref >59)
GFR calc non Af Amer: 28 mL/min/{1.73_m2} — ABNORMAL LOW (ref >59)
Glucose: 83 mg/dL (ref 65–99)
Potassium: 4.6 mmol/L (ref 3.5–5.2)
Sodium: 140 mmol/L (ref 134–144)

## 2020-05-05 ENCOUNTER — Ambulatory Visit: Payer: Medicare Other | Admitting: Physician Assistant

## 2020-06-09 NOTE — Progress Notes (Deleted)
Cardiology Office Note:    Date:  06/09/2020   ID:  [name], DOB 1945/12/20, MRN [id number]  PCP:  Raelene Bott, MD  Cardiologist:  Sherren Mocha, MD *** Electrophysiologist:  None   Referring MD: Raelene Bott, MD   Chief Complaint:  No chief complaint on file.    Patient Profile:    [name] is a 74 y.o. male with:   Persistent Atrial fibrillation ? CHADS2-VASc=5 (age x 1, vascular dz, HTN, CVA) >>Apixaban ? S/p DCCV 11/2019 >> NSR  PAD  Carotid artery disease (managed by Dr. Oneida Alar) ? S/p L CEA 2007 ? S/p L CCA stenting in 2011 2/2 restenosis ? S/p L ICA stenting 07/2019 2/2 restenosis  Chronic Diastolic CHF  Hx of CVA   Hypertension   Hyperlipidemia   Chronic kidney disease 3  COPD  Gout   Prior CV studies: 3 day Zio Monitor 09/2019 Avg HR 93 (max HR 178) 2 episodes of NSVT (longest 4 beats)  Echocardiogram 08/27/2019 EF 55-60, mild LVH, mod LAE, mild MAC, trace MR, mod TR, RVSP 27.4  Carotid US 08/15/2019 Summary: Right Carotid: Velocities in the right ICA are consistent with a 1-39% stenosis. Non-hemodynamically significant plaque <50% noted in the CCA. The ECA appears <50% stenosed. Left Carotid: There is no evidence of stenosis in the left ICA. Patent left common carotid artery stent with no evidence of restenosis. Vertebrals: Right vertebral artery demonstrates antegrade flow. Left vertebral artery demonstrates high resistant flow. Subclavians: Normal flow hemodynamics were seen in bilateral subclavian arteries.  Myoview 02/04/15 Low risk stress nuclear study with small-sized, mild intensity fixed apical perfusion defect (which is actually worse at rest than stress), likely attenuation artifact. LV Ejection Fraction: 60%.   History of Present Illness:    [name] was last seen in 03/24/20 by Kathyrn Drown, NP.  He had been admitted to [facility] with  a COPD exacerbation.  His Furosemide was stopped during that admission due to worsening renal function.  Sharee Pimple started him back on low dose Furosemide.  He returns for follow up.  The DICTATELATER SmartLink is not supported in this context. ***   Past Medical History:  Diagnosis Date   CKD (chronic kidney disease), stage III    Dyspnea on exertion    Edema    HTN (hypertension)    Hyperlipidemia    Left carotid stenosis    a. 2007 s/p L CEA;  b. 2011 s/p L common carotid stenting 2/2 restenosis;  c. 07/6766 u/s: RICA <20, LICA 94-70%, patent LCCA stent.   PAD (peripheral artery disease) (HCC)    a. s/p prior RSFA, REIA, distal LSFA (known to be occluded), LEIA, and LCIA stenting;  b. 01/2015 ABI's: R 0.78, L 0.53.   Stroke [facility])     Current Medications: No outpatient medications have been marked as taking for the 06/10/20 encounter (Appointment) with [name] T, PA-C.     Allergies:   Plavix [clopidogrel]   Social History   Tobacco Use   Smoking status: Current Every Day Smoker    Packs/day: 1.00    Years: 50.00    Pack years: 50.00    Types: Cigarettes   Smokeless tobacco: Never Used  Vaping Use   Vaping Use: Never used  Substance Use Topics   Alcohol use: Yes    Alcohol/week: 7.0 standard drinks    Types: 7 Glasses of wine per week   Drug use: No     Family Hx: The patient's family history  includes Hyperlipidemia in his mother; Hypertension in his mother.  ROS   EKGs/Labs/Other Test Reviewed:    EKG:  EKG is *** ordered today.  The ekg ordered today demonstrates ***  Recent Labs: 10/29/2019: Hemoglobin 14.2; Platelets 168 11/25/2019: NT-Pro BNP 2,361 03/31/2020: BUN 24; Creatinine, Ser 2.20; Potassium 4.6; Sodium 140   Recent Lipid Panel Lab Results  Component Value Date/Time   CHOL 194 03/13/2012 08:36 AM   TRIG 341.0 (H) 03/13/2012 08:36 AM   HDL 35.90 (L) 03/13/2012 08:36 AM   CHOLHDL 5 03/13/2012 08:36 AM   LDLDIRECT 98.5 03/13/2012 08:36  AM    Physical Exam:    VS:  There were no vitals taken for this visit.    Wt Readings from Last 3 Encounters:  03/24/20 234 lb 6.4 oz (106.3 kg)  12/10/19 225 lb (102.1 kg)  11/25/19 220 lb (99.8 kg)     Physical Exam ***  ASSESSMENT & PLAN:    *** 1. Acute diastolic CHF (congestive heart failure) (HCC) Volume status improved.  Continue Furosemide 60 mg twice daily.  Obtain BMET today.  FU 3 mos.    2. Stage 3a chronic kidney disease Recent Creatinine 2.28.  Continue Furosemide 60 mg twice daily. BMET today.   3. Persistent atrial fibrillation (HCC) Maintaining normal sinus rhythm by exam.  Continue Apixaban.    4. Essential hypertension BP borderline. Continue current regimen.  Continue to monitor.   Dispo:  No follow-ups on file.   Medication Adjustments/Labs and Tests Ordered: Current medicines are reviewed at length with the patient today.  Concerns regarding medicines are outlined above.  Tests Ordered: No orders of the defined types were placed in this encounter.  Medication Changes: No orders of the defined types were placed in this encounter.   Signed, [name], PA-C  06/09/2020 11:26 PM    [facility] [address], [place], [place]  [address] Phone: [phone]; Fax: [phone]

## 2020-06-10 ENCOUNTER — Ambulatory Visit: Payer: Medicare Other | Admitting: Physician Assistant

## 2020-08-03 ENCOUNTER — Other Ambulatory Visit: Payer: Self-pay | Admitting: Cardiovascular Disease

## 2020-08-03 ENCOUNTER — Other Ambulatory Visit: Payer: Self-pay | Admitting: Physician Assistant

## 2020-08-03 NOTE — Telephone Encounter (Signed)
Pt's age 74, wt 106.3 kg, SCr 2.2, CrCl 43.62, last ov w/ JM 03/24/20.

## 2020-11-01 ENCOUNTER — Other Ambulatory Visit: Payer: Self-pay | Admitting: Cardiovascular Disease

## 2020-11-10 DIAGNOSIS — M792 Neuralgia and neuritis, unspecified: Secondary | ICD-10-CM | POA: Insufficient documentation

## 2020-12-31 ENCOUNTER — Other Ambulatory Visit: Payer: Self-pay | Admitting: Physician Assistant

## 2021-02-08 DIAGNOSIS — M1A3790 Chronic gout due to renal impairment, unspecified ankle and foot, without tophus (tophi): Secondary | ICD-10-CM | POA: Insufficient documentation

## 2021-03-05 DIAGNOSIS — I639 Cerebral infarction, unspecified: Secondary | ICD-10-CM

## 2021-03-05 HISTORY — DX: Cerebral infarction, unspecified: I63.9

## 2021-03-09 ENCOUNTER — Other Ambulatory Visit: Payer: Self-pay

## 2021-03-09 ENCOUNTER — Emergency Department (HOSPITAL_COMMUNITY): Payer: Medicare Other

## 2021-03-09 ENCOUNTER — Inpatient Hospital Stay (HOSPITAL_COMMUNITY)
Admission: EM | Admit: 2021-03-09 | Discharge: 2021-03-12 | DRG: 253 | Disposition: A | Discharge status: Home / Self Care | Payer: Medicare Other | Attending: Internal Medicine | Admitting: Internal Medicine

## 2021-03-09 ENCOUNTER — Encounter (HOSPITAL_COMMUNITY): Payer: Self-pay | Admitting: Pharmacy Technician

## 2021-03-09 DIAGNOSIS — I6522 Occlusion and stenosis of left carotid artery: Secondary | ICD-10-CM | POA: Diagnosis not present

## 2021-03-09 DIAGNOSIS — Z7901 Long term (current) use of anticoagulants: Secondary | ICD-10-CM

## 2021-03-09 DIAGNOSIS — E785 Hyperlipidemia, unspecified: Secondary | ICD-10-CM | POA: Diagnosis present

## 2021-03-09 DIAGNOSIS — N1832 Chronic kidney disease, stage 3b: Secondary | ICD-10-CM | POA: Diagnosis not present

## 2021-03-09 DIAGNOSIS — G459 Transient cerebral ischemic attack, unspecified: Secondary | ICD-10-CM | POA: Diagnosis present

## 2021-03-09 DIAGNOSIS — I130 Hypertensive heart and chronic kidney disease with heart failure and stage 1 through stage 4 chronic kidney disease, or unspecified chronic kidney disease: Secondary | ICD-10-CM | POA: Diagnosis present

## 2021-03-09 DIAGNOSIS — Z8249 Family history of ischemic heart disease and other diseases of the circulatory system: Secondary | ICD-10-CM

## 2021-03-09 DIAGNOSIS — I480 Paroxysmal atrial fibrillation: Secondary | ICD-10-CM | POA: Diagnosis present

## 2021-03-09 DIAGNOSIS — Z7982 Long term (current) use of aspirin: Secondary | ICD-10-CM | POA: Diagnosis not present

## 2021-03-09 DIAGNOSIS — I5032 Chronic diastolic (congestive) heart failure: Secondary | ICD-10-CM | POA: Diagnosis present

## 2021-03-09 DIAGNOSIS — N184 Chronic kidney disease, stage 4 (severe): Secondary | ICD-10-CM | POA: Diagnosis present

## 2021-03-09 DIAGNOSIS — H5462 Unqualified visual loss, left eye, normal vision right eye: Secondary | ICD-10-CM | POA: Diagnosis present

## 2021-03-09 DIAGNOSIS — I10 Essential (primary) hypertension: Secondary | ICD-10-CM | POA: Diagnosis present

## 2021-03-09 DIAGNOSIS — I129 Hypertensive chronic kidney disease with stage 1 through stage 4 chronic kidney disease, or unspecified chronic kidney disease: Secondary | ICD-10-CM | POA: Diagnosis not present

## 2021-03-09 DIAGNOSIS — Z79899 Other long term (current) drug therapy: Secondary | ICD-10-CM

## 2021-03-09 DIAGNOSIS — Z888 Allergy status to other drugs, medicaments and biological substances status: Secondary | ICD-10-CM

## 2021-03-09 DIAGNOSIS — Y718 Miscellaneous cardiovascular devices associated with adverse incidents, not elsewhere classified: Secondary | ICD-10-CM | POA: Diagnosis present

## 2021-03-09 DIAGNOSIS — I471 Supraventricular tachycardia: Secondary | ICD-10-CM | POA: Diagnosis not present

## 2021-03-09 DIAGNOSIS — F17210 Nicotine dependence, cigarettes, uncomplicated: Secondary | ICD-10-CM | POA: Diagnosis present

## 2021-03-09 DIAGNOSIS — I4819 Other persistent atrial fibrillation: Secondary | ICD-10-CM | POA: Diagnosis present

## 2021-03-09 DIAGNOSIS — Z8673 Personal history of transient ischemic attack (TIA), and cerebral infarction without residual deficits: Secondary | ICD-10-CM | POA: Diagnosis not present

## 2021-03-09 DIAGNOSIS — N183 Chronic kidney disease, stage 3 unspecified: Secondary | ICD-10-CM | POA: Diagnosis present

## 2021-03-09 DIAGNOSIS — G453 Amaurosis fugax: Secondary | ICD-10-CM | POA: Diagnosis present

## 2021-03-09 DIAGNOSIS — Z83438 Family history of other disorder of lipoprotein metabolism and other lipidemia: Secondary | ICD-10-CM

## 2021-03-09 DIAGNOSIS — I6389 Other cerebral infarction: Secondary | ICD-10-CM | POA: Diagnosis not present

## 2021-03-09 DIAGNOSIS — I639 Cerebral infarction, unspecified: Secondary | ICD-10-CM

## 2021-03-09 DIAGNOSIS — T82868A Thrombosis of vascular prosthetic devices, implants and grafts, initial encounter: Secondary | ICD-10-CM

## 2021-03-09 DIAGNOSIS — T82858A Stenosis of vascular prosthetic devices, implants and grafts, initial encounter: Secondary | ICD-10-CM | POA: Diagnosis present

## 2021-03-09 DIAGNOSIS — I739 Peripheral vascular disease, unspecified: Secondary | ICD-10-CM | POA: Diagnosis present

## 2021-03-09 DIAGNOSIS — Z7951 Long term (current) use of inhaled steroids: Secondary | ICD-10-CM

## 2021-03-09 DIAGNOSIS — I5030 Unspecified diastolic (congestive) heart failure: Secondary | ICD-10-CM | POA: Diagnosis not present

## 2021-03-09 DIAGNOSIS — Z20822 Contact with and (suspected) exposure to covid-19: Secondary | ICD-10-CM | POA: Diagnosis present

## 2021-03-09 LAB — I-STAT CHEM 8, ED
BUN: 40 mg/dL — ABNORMAL HIGH (ref 8–23)
Calcium, Ion: 1.09 mmol/L — ABNORMAL LOW (ref 1.15–1.40)
Chloride: 104 mmol/L (ref 98–111)
Creatinine, Ser: 3 mg/dL — ABNORMAL HIGH (ref 0.61–1.24)
Glucose, Bld: 102 mg/dL — ABNORMAL HIGH (ref 70–99)
HCT: 44 % (ref 39.0–52.0)
Hemoglobin: 15 g/dL (ref 13.0–17.0)
Potassium: 4.4 mmol/L (ref 3.5–5.1)
Sodium: 134 mmol/L — ABNORMAL LOW (ref 135–145)
TCO2: 21 mmol/L — ABNORMAL LOW (ref 22–32)

## 2021-03-09 LAB — DIFFERENTIAL
Abs Immature Granulocytes: 0.06 10*3/uL (ref 0.00–0.07)
Basophils Absolute: 0.1 10*3/uL (ref 0.0–0.1)
Basophils Relative: 1 %
Eosinophils Absolute: 0.5 10*3/uL (ref 0.0–0.5)
Eosinophils Relative: 4 %
Immature Granulocytes: 1 %
Lymphocytes Relative: 22 %
Lymphs Abs: 2.8 10*3/uL (ref 0.7–4.0)
Monocytes Absolute: 1 10*3/uL (ref 0.1–1.0)
Monocytes Relative: 8 %
Neutro Abs: 8.1 10*3/uL — ABNORMAL HIGH (ref 1.7–7.7)
Neutrophils Relative %: 64 %

## 2021-03-09 LAB — RAPID URINE DRUG SCREEN, HOSP PERFORMED
Amphetamines: NOT DETECTED
Barbiturates: NOT DETECTED
Benzodiazepines: NOT DETECTED
Cocaine: NOT DETECTED
Opiates: NOT DETECTED
Tetrahydrocannabinol: NOT DETECTED

## 2021-03-09 LAB — COMPREHENSIVE METABOLIC PANEL
ALT: 16 U/L (ref 0–44)
AST: 24 U/L (ref 15–41)
Albumin: 3 g/dL — ABNORMAL LOW (ref 3.5–5.0)
Alkaline Phosphatase: 73 U/L (ref 38–126)
Anion gap: 10 (ref 5–15)
BUN: 33 mg/dL — ABNORMAL HIGH (ref 8–23)
CO2: 21 mmol/L — ABNORMAL LOW (ref 22–32)
Calcium: 8.9 mg/dL (ref 8.9–10.3)
Chloride: 102 mmol/L (ref 98–111)
Creatinine, Ser: 2.9 mg/dL — ABNORMAL HIGH (ref 0.61–1.24)
GFR, Estimated: 22 mL/min — ABNORMAL LOW (ref >60)
Glucose, Bld: 106 mg/dL — ABNORMAL HIGH (ref 70–99)
Potassium: 4.5 mmol/L (ref 3.5–5.1)
Sodium: 133 mmol/L — ABNORMAL LOW (ref 135–145)
Total Bilirubin: 0.6 mg/dL (ref 0.3–1.2)
Total Protein: 6 g/dL — ABNORMAL LOW (ref 6.5–8.1)

## 2021-03-09 LAB — CBC
HCT: 43.4 % (ref 39.0–52.0)
Hemoglobin: 14.4 g/dL (ref 13.0–17.0)
MCH: 31 pg (ref 26.0–34.0)
MCHC: 33.2 g/dL (ref 30.0–36.0)
MCV: 93.5 fL (ref 80.0–100.0)
Platelets: 204 10*3/uL (ref 150–400)
RBC: 4.64 MIL/uL (ref 4.22–5.81)
RDW: 14.6 % (ref 11.5–15.5)
WBC: 12.4 10*3/uL — ABNORMAL HIGH (ref 4.0–10.5)
nRBC: 0 % (ref 0.0–0.2)

## 2021-03-09 LAB — URINALYSIS, ROUTINE W REFLEX MICROSCOPIC
Bacteria, UA: NONE SEEN
Bilirubin Urine: NEGATIVE
Glucose, UA: 50 mg/dL — AB
Ketones, ur: NEGATIVE mg/dL
Leukocytes,Ua: NEGATIVE
Nitrite: NEGATIVE
Protein, ur: 100 mg/dL — AB
Specific Gravity, Urine: 1.018 (ref 1.005–1.030)
pH: 6 (ref 5.0–8.0)

## 2021-03-09 LAB — ETHANOL: Alcohol, Ethyl (B): <10 mg/dL (ref <10)

## 2021-03-09 LAB — MAGNESIUM: Magnesium: 1.5 mg/dL — ABNORMAL LOW (ref 1.7–2.4)

## 2021-03-09 LAB — PHOSPHORUS: Phosphorus: 3.8 mg/dL (ref 2.5–4.6)

## 2021-03-09 LAB — POTASSIUM: Potassium: 3.8 mmol/L (ref 3.5–5.1)

## 2021-03-09 LAB — CBG MONITORING, ED: Glucose-Capillary: 101 mg/dL — ABNORMAL HIGH (ref 70–99)

## 2021-03-09 LAB — RESP PANEL BY RT-PCR (FLU A&B, COVID) ARPGX2
Influenza A by PCR: NEGATIVE
Influenza B by PCR: NEGATIVE
SARS Coronavirus 2 by RT PCR: NEGATIVE

## 2021-03-09 LAB — PROTIME-INR
INR: 1.2 (ref 0.8–1.2)
Prothrombin Time: 14.9 seconds (ref 11.4–15.2)

## 2021-03-09 LAB — APTT: aPTT: 41 seconds — ABNORMAL HIGH (ref 24–36)

## 2021-03-09 MED ORDER — STROKE: EARLY STAGES OF RECOVERY BOOK
Freq: Once | Status: AC
  Filled 2021-03-09 (×2): qty 1

## 2021-03-09 MED ORDER — ACETAMINOPHEN 650 MG RE SUPP: 650.0000 mg | RECTAL | Status: DC | PRN

## 2021-03-09 MED ORDER — ROSUVASTATIN CALCIUM 5 MG PO TABS: 10.0000 mg | ORAL_TABLET | Freq: Every evening | ORAL | Status: DC

## 2021-03-09 MED ORDER — ACETAMINOPHEN 160 MG/5ML PO SOLN: 650.0000 mg | ORAL | Status: DC | PRN

## 2021-03-09 MED ORDER — HEPARIN (PORCINE) 25000 UT/250ML-% IV SOLN
1300.0000 [IU]/h | INTRAVENOUS | Status: DC
  Administered 2021-03-09: 1700 [IU]/h via INTRAVENOUS
  Administered 2021-03-10: 1300 [IU]/h via INTRAVENOUS
  Filled 2021-03-09 (×2): qty 250

## 2021-03-09 MED ORDER — ROSUVASTATIN CALCIUM 20 MG PO TABS
20.0000 mg | ORAL_TABLET | Freq: Every evening | ORAL | Status: DC
  Administered 2021-03-09 – 2021-03-11 (×2): 20 mg via ORAL
  Filled 2021-03-09 (×3): qty 1

## 2021-03-09 MED ORDER — ACETAMINOPHEN 325 MG PO TABS: 650.0000 mg | ORAL_TABLET | ORAL | Status: DC | PRN

## 2021-03-09 MED ORDER — IPRATROPIUM-ALBUTEROL 0.5-2.5 (3) MG/3ML IN SOLN: 3.0000 mL | RESPIRATORY_TRACT | Status: DC | PRN

## 2021-03-09 MED ORDER — UMECLIDINIUM BROMIDE 62.5 MCG/INH IN AEPB
1.0000 | INHALATION_SPRAY | Freq: Every day | RESPIRATORY_TRACT | Status: DC
  Administered 2021-03-11 – 2021-03-12 (×2): 1 via RESPIRATORY_TRACT
  Filled 2021-03-09 (×2): qty 7

## 2021-03-09 MED ORDER — FLUTICASONE FUROATE-VILANTEROL 100-25 MCG/INH IN AEPB
1.0000 | INHALATION_SPRAY | Freq: Every day | RESPIRATORY_TRACT | Status: DC
  Administered 2021-03-11 – 2021-03-12 (×2): 1 via RESPIRATORY_TRACT
  Filled 2021-03-09 (×2): qty 28

## 2021-03-09 MED ORDER — MAGNESIUM SULFATE 2 GM/50ML IV SOLN
2.0000 g | Freq: Once | INTRAVENOUS | Status: AC
  Administered 2021-03-09: 2 g via INTRAVENOUS
  Filled 2021-03-09: qty 50

## 2021-03-09 MED ORDER — IOHEXOL 350 MG/ML SOLN
75.0000 mL | Freq: Once | INTRAVENOUS | Status: AC | PRN
  Administered 2021-03-09: 75 mL via INTRAVENOUS

## 2021-03-09 MED ORDER — SENNOSIDES-DOCUSATE SODIUM 8.6-50 MG PO TABS
1.0000 | ORAL_TABLET | Freq: Every day | ORAL | Status: DC
  Administered 2021-03-09 – 2021-03-10 (×2): 1 via ORAL
  Filled 2021-03-09 (×3): qty 1

## 2021-03-09 NOTE — Hospital Course (Addendum)
  03/10/21 subjective   Patient reports he has back pain from the uncomfortable hospital bed. He does not recall feeling his heart race overnight but notes that he occasionally does feel his heart fluttering. States he is looking forward to surgery because he knows he needs it to be able to be discharged home. Does not want a nicotine patch at this time. Has not noticed new weakness.  4/7 subjective  Patient reports he is doing "alright" after surgery. States he feels pain in "expected places," I.e., "where the surgeons cut." Notes throat soreness and voice hoarseness after intubation during surgery. Denies any episodes of vision changes, weakness. He is pleased to make the connection that his PCP is Dr. Heber Cloverdale, attending Dr. Jodene Nam father. Patient states he will be disappointed if he isn't able to get home tomorrow.

## 2021-03-09 NOTE — Code Documentation (Signed)
Patient had a 2 min span of what he describes as "frosted windshield" vision in 1/3 of the bottom of his left visual field yesterday on 4/4. Today at his doctor's appointment (nephrologist) he had about 4-5 min of complete vision loss in his left eye starting at 1310. It eventually returned back to normal but his MD suggested he come to the ED to be checked out. His wife drove him to [facility] by POV. He was triaged and a code stroke was activated. Pt was taken for CT/CTA. Results below. He has a hx of CVAs and has a left carotid stent from several years ago. He is on Eliquis and had his last dose this am. His NIHSS is 0 at this time. He is not a candidate for TPA r/t blood thinner and symptoms resolved. MD ordered an MRI. Pt was taken for his scan. His wife was updated by phone and is on her way to the patient's room from the parking lot. MD will determine plan of care once MRI results. Hand off with ED RN for  q2x12 then q4 vitals/neuro checks.   "IMPRESSION: 1. No acute intracranial abnormality 2. ASPECTS is 10  IMPRESSION: 1. Left carotid stenting across the bifurcation. The stent is patent however there is filling defect within the distal stent which may be due to acute thrombus or atherosclerotic plaque. This appears to be causing significant stenosis. 2. Small left middle cerebral artery compared to the right likely due to hypoperfusion. No intracranial large vessel occlusion 3. Atherosclerotic aorta. Mild atherosclerotic disease right carotid bifurcation. 4. Moderate stenosis left vertebral artery which has origin from the arch. 5. These results were called by telephone at the time of interpretation on 03/09/2021 at 2:49 pm to provider [facility] , who verbally acknowledged these results"   Renika Shiflet, Rande Brunt, RN  Stroke Response Nurse

## 2021-03-09 NOTE — ED Notes (Signed)
RN paged Dr. Hal Hope per pt displaying ventricular tachycardia on telemonitor

## 2021-03-09 NOTE — ED Notes (Signed)
Pt. Tolerate stroke swallow screen well.

## 2021-03-09 NOTE — H&P (Addendum)
Date: 03/10/2021               Patient Name:  [name] MRN: [id number]  DOB: 01-Jan-1946 Age / Sex: 75 y.o., male   PCP: Raelene Bott, MD         Medical Service: Internal Medicine Teaching Service         Attending Physician: Dr. 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 Patient states yesterday morning he was home when he noticed that he had blurry vision in his left eye. States he felt like a curtain was starting at the bottom of the eye and coming up, denied eye pain. He states he could still light but everything was fuzzy. This lasted for two minutes and he did not notice any other symptoms.  This afternoon he went to his nephrologist and while vitals were being taken he noticed he lost his vision in his entire left eye without pain. Again said he could still see light, but could not make out shapes. Episode lasted for 4 minutes and vision came back. He noticed no other neurological deficits during the episode. He states nothing like this had ever happened before. He has been taking daily Eliquis and ASA and denies missing doses. Last took Eliquis this morning. His wife brought him to the Emergency room directly from nephrologist.    ED Course: Patient seen in triage and CODE stroke was activated. Neurology consulted, CT head showed no acute intercranial abnormality. CTA head and neck significant for stent across the left carotid bifurcation with filling  defect likely due to thrombus or atherosclerotic plaque causing significant narrowing of lumen. STAT MRI was preformed significant for small focus of restricted diffusion within the left cerebellar hemisphere. Chronic small vessel ischemia. Vascular surgery consulted.      Lab Orders     Resp Panel by RT-PCR (Flu A&B, Covid) Nasopharyngeal Swab     Ethanol     Protime-INR     APTT     CBC     Differential     Comprehensive metabolic panel     Urine rapid drug screen (hosp performed)     Urinalysis, Routine w reflex microscopic     APTT     Hemoglobin A1c     Lipid panel     CBC     Basic metabolic panel     Potassium     Magnesium     Phosphorus     Heparin level (unfractionated)     APTT     Magnesium     APTT     Heparin level (unfractionated)     CBC     Basic metabolic panel     I-stat chem 8, ED     CBG monitoring, ED   Meds:  Current Meds  Medication Sig  . albuterol (VENTOLIN HFA) 108 (90 Base) MCG/ACT inhaler Inhale 2 puffs into the lungs every 6 (six) hours as needed (wheezing/shortness of  breath).   [name] allopurinol (ZYLOPRIM) 300 MG tablet Take 300 mg by mouth daily.  [name] amLODipine (NORVASC) 10 MG tablet Take 10 mg by mouth daily.  [name] aspirin EC 81 MG tablet Take 1 tablet (81 mg total) by mouth daily. (Patient taking differently: Take 81 mg by mouth at bedtime.)  . azelastine (ASTELIN) 0.1 % nasal spray Place 1-2 sprays into both nostrils 2 (two) times daily as needed for rhinitis. Use in each nostril as directed  . carvedilol (COREG) 12.5 MG tablet Take 1 tablet (12.5 mg total) by mouth 2 (two) times daily.  [name] ELIQUIS 5 MG TABS tablet TAKE 1 TABLET(5 MG) BY MOUTH TWICE DAILY (Patient taking differently: Take 5 mg by mouth 2 (two) times daily.)  . furosemide (LASIX) 40 MG tablet Take 40 mg by mouth as needed for fluid (swelling).  . gabapentin (NEURONTIN) 300 MG capsule Take 300 mg by mouth 2 (two) times daily.  [name] losartan-hydrochlorothiazide (HYZAAR) 50-12.5 MG  tablet Take 1 tablet by mouth daily.  [name] omeprazole (PRILOSEC) 20 MG capsule Take 20 mg by mouth daily with breakfast.   . oxyCODONE-acetaminophen (PERCOCET) 7.5-325 MG tablet Take 0.5 tablets by mouth every 5 (five) hours as needed for severe pain.   . potassium chloride (KLOR-CON) 10 MEQ tablet TAKE 1 TABLET(10 MEQ) BY MOUTH DAILY (Patient taking differently: Take 10 mEq by mouth daily.)  . rosuvastatin (CRESTOR) 10 MG tablet Take 10 mg by mouth every evening.   . TRELEGY ELLIPTA 100-62.5-25 MCG/INH AEPB Inhale 1 puff into the lungs at bedtime.     Social:  Lives With: Wife  Support: Well supported  Level of Function: Full function  PCP: Vilma Prader, MD Substances: 1 PPD tobacco use x 50 years, 7 ETOH units per week, denies illicit drug use   Family History: Stroke in mother, HTN mother   Allergies: Allergies as of 03/09/2021 - Review Complete 03/09/2021  Allergen Reaction Noted  . Plavix [clopidogrel] Rash 01/29/2015   Past Medical History:  Diagnosis Date  . CKD (chronic kidney disease), stage III ([place])   . Dyspnea on exertion   . Edema   . HTN (hypertension)   . Hyperlipidemia   . Left carotid stenosis    a. 2007 s/p L CEA;  b. 2011 s/p L common carotid stenting 2/2 restenosis;  c. Q000111Q u/s: RICA [id number], LICA [id number], patent LCCA stent.  [name] PAD (peripheral artery disease) (HCC)    a. s/p prior RSFA, REIA, distal LSFA (known to be occluded), LEIA, and LCIA stenting;  b. 01/2015 ABI's: R 0.78, L 0.53.  [name] Stroke [facility])      Review of Systems: A complete ROS was negative except as per HPI.   OBJECTIVE:   Physical Exam: Blood pressure (!) 160/82, pulse 73, temperature 97.7 F (36.5 C), temperature source Oral, resp. rate 16, height 6' (1.829 m), weight 103 kg, SpO2 96 %. Physical Exam Constitutional:      General: He is not in acute distress.    Appearance: Normal appearance. He is obese. He is not ill-appearing, toxic-appearing or diaphoretic.  HENT:     Head:  Normocephalic and atraumatic.     Right Ear: External ear normal.     Left Ear: External ear normal.     Nose: No congestion.     Mouth/Throat:     Mouth: Mucous membranes are moist.     Pharynx: Oropharynx is clear.  Eyes:     General: No scleral icterus.  Right eye: No discharge.        Left eye: No discharge.     Extraocular Movements: Extraocular movements intact.     Conjunctiva/sclera: Conjunctivae normal.  Cardiovascular:     Rate and Rhythm: Normal rate. Rhythm irregular.  Pulmonary:     Effort: Pulmonary effort is normal. No respiratory distress.     Breath sounds: Normal breath sounds. No stridor. No wheezing, rhonchi or rales.  Abdominal:     General: Bowel sounds are normal. There is no distension.     Palpations: Abdomen is soft. There is no mass.     Tenderness: There is no abdominal tenderness. There is no guarding.     Hernia: A hernia is present.  Musculoskeletal:        General: Normal range of motion.     Cervical back: Normal range of motion and neck supple.  Skin:    General: Skin is warm and dry.     Comments: Erythema and scaling of LE bilateral   Neurological:     General: No focal deficit present.     Mental Status: He is alert and oriented to person, place, and time.     Cranial Nerves: No cranial nerve deficit.     Sensory: No sensory deficit.     Motor: No weakness.     Coordination: Coordination normal.  Psychiatric:        Mood and Affect: Mood normal.    Labs: CBC    Component Value Date/Time   WBC 10.0 03/10/2021 0255   RBC 4.20 (L) 03/10/2021 0255   HGB 13.1 03/10/2021 0255   HGB 14.2 10/29/2019 0918   HCT 38.6 (L) 03/10/2021 0255   HCT 41.1 10/29/2019 0918   PLT 202 03/10/2021 0255   PLT 168 10/29/2019 0918   MCV 91.9 03/10/2021 0255   MCV 89 10/29/2019 0918   MCH 31.2 03/10/2021 0255   MCHC 33.9 03/10/2021 0255   RDW 14.6 03/10/2021 0255   RDW 12.3 10/29/2019 0918   LYMPHSABS 2.8 03/09/2021 1415   MONOABS 1.0  03/09/2021 1415   EOSABS 0.5 03/09/2021 1415   BASOSABS 0.1 03/09/2021 1415     CMP     Component Value Date/Time   NA 134 (L) 03/10/2021 0255   NA 140 03/31/2020 1244   K 4.0 03/10/2021 0255   CL 104 03/10/2021 0255   CO2 22 03/10/2021 0255   GLUCOSE 90 03/10/2021 0255   BUN 32 (H) 03/10/2021 0255   BUN 24 03/31/2020 1244   CREATININE 2.76 (H) 03/10/2021 0255   CALCIUM 8.9 03/10/2021 0255   PROT 6.0 (L) 03/09/2021 1415   ALBUMIN 3.0 (L) 03/09/2021 1415   AST 24 03/09/2021 1415   ALT 16 03/09/2021 1415   ALKPHOS 73 03/09/2021 1415   BILITOT 0.6 03/09/2021 1415   GFRNONAA 23 (L) 03/10/2021 0255   GFRAA 33 (L) 03/31/2020 1244    Imaging: ECHOCARDIOGRAM COMPLETE  Result Date: 03/10/2021    ECHOCARDIOGRAM REPORT   Patient Name:   ARTORIUS EGELAND Date of Exam: 03/10/2021 Medical Rec #:  [id number]      Height:       72.0 in Accession #:    [id number]     Weight:       227.1 lb Date of Birth:  1946/04/13     BSA:          2.249 m Patient Age:    17 years       BP:  147/64 mmHg Patient Gender: M              HR:           54 bpm. Exam Location:  Inpatient Procedure: 2D Echo Indications:    Stroke  History:        Patient has prior history of Echocardiogram examinations, most                 recent 08/27/2019. Risk Factors:Hypertension and Dyslipidemia.  Sonographer:    Mikki Santee RDCS (AE) Referring Phys: [id number] [place]  1. Left ventricular ejection fraction, by estimation, is 55 to 60%. The left ventricle has normal function. The left ventricle has no regional wall motion abnormalities. There is mild left ventricular hypertrophy. Left ventricular diastolic parameters are consistent with Grade I diastolic dysfunction (impaired relaxation).  2. Right ventricular systolic function is normal. The right ventricular size is normal.  3. The mitral valve is grossly normal. Trivial mitral valve regurgitation.  4. The aortic valve is tricuspid. Aortic valve  regurgitation is not visualized.  5. The inferior vena cava is normal in size with <50% respiratory variability, suggesting right atrial pressure of 8 mmHg. Comparison(s): No significant change from prior study. 08/27/2019: LVEF 55-60%. Conclusion(s)/Recommendation(s): No intracardiac source of embolism detected on this transthoracic study. A transesophageal echocardiogram is recommended to exclude cardiac source of embolism if clinically indicated. FINDINGS  Left Ventricle: Left ventricular ejection fraction, by estimation, is 55 to 60%. The left ventricle has normal function. The left ventricle has no regional wall motion abnormalities. The left ventricular internal cavity size was normal in size. There is  mild left ventricular hypertrophy. Left ventricular diastolic parameters are consistent with Grade I diastolic dysfunction (impaired relaxation). Indeterminate filling pressures. Right Ventricle: The right ventricular size is normal. No increase in right ventricular wall thickness. Right ventricular systolic function is normal. Left Atrium: Left atrial size was normal in size. Right Atrium: Right atrial size was normal in size. Pericardium: There is no evidence of pericardial effusion. Mitral Valve: The mitral valve is grossly normal. Trivial mitral valve regurgitation. Tricuspid Valve: The tricuspid valve is grossly normal. Tricuspid valve regurgitation is trivial. Aortic Valve: The aortic valve is tricuspid. Aortic valve regurgitation is not visualized. Pulmonic Valve: The pulmonic valve was normal in structure. Pulmonic valve regurgitation is not visualized. Aorta: The aortic root and ascending aorta are structurally normal, with no evidence of dilitation. Venous: The inferior vena cava is normal in size with less than 50% respiratory variability, suggesting right atrial pressure of 8 mmHg. IAS/Shunts: The interatrial septum was not well visualized.  LEFT VENTRICLE PLAX 2D LVIDd:         4.30 cm  Diastology  LVIDs:         3.30 cm  LV e' medial:    5.11 cm/s LV PW:         1.10 cm  LV E/e' medial:  20.2 LV IVS:        1.10 cm  LV e' lateral:   8.81 cm/s LVOT diam:     2.50 cm  LV E/e' lateral: 11.7 LV SV:         112 LV SV Index:   50 LVOT Area:     4.91 cm  RIGHT VENTRICLE RV S prime:     13.30 cm/s TAPSE (M-mode): 1.8 cm LEFT ATRIUM             Index       RIGHT ATRIUM  Index LA diam:        4.50 cm 2.00 cm/m  RA Area:     18.30 cm LA Vol (A2C):   63.8 ml 28.37 ml/m RA Volume:   46.20 ml  20.55 ml/m LA Vol (A4C):   47.7 ml 21.21 ml/m LA Biplane Vol: 55.3 ml 24.59 ml/m  AORTIC VALVE LVOT Vmax:   101.00 cm/s LVOT Vmean:  67.800 cm/s LVOT VTI:    0.228 m  AORTA Ao Root diam: 3.00 cm MITRAL VALVE MV Area (PHT): 2.24 cm     SHUNTS MV Decel Time: 338 msec     Systemic VTI:  0.23 m MV E velocity: 103.25 cm/s  Systemic Diam: 2.50 cm MV A velocity: 122.00 cm/s MV E/A ratio:  0.85 Lyman Bishop MD Electronically signed by Lyman Bishop MD Signature Date/Time: 03/10/2021/3:25:08 PM    Final     EKG: personally reviewed my interpretation is Afib with rate of 78.    ASSESSMENT & PLAN:    Assessment & Plan by Problem: Principal Problem:   Carotid artery thrombosis, left Active Problems:   CKD (chronic kidney disease), stage III (HCC)   HTN (hypertension)   Persistent atrial fibrillation (HCC)   Cerebellar infarction ([place])   Percy Gersh Costello is a 75 y.o. 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 Left carotid endarterectomy 2007, L carotid stent placement 2007, last carotid duplex 2020 w/o deficit. Patient with two episodes amaurosis fugax of  left eye over last 48 hours lasting 2 and 4 min, respectively. CTA of head and neck concerning for thrombus vs atherosclerosis of L carotid with significant narrowing. Likely the cause of vision loss. Patient evaluated by vascular surgery and redo of left carotid endarterectomy with inner position graft scheduled for tomorrow. Speech evaluated at bedside.  Cerebellar infarction  MRI with acute/subacute infarct of left cerebellar hemisphere. No current deficit noticed on PE. Stroke work-up advised as per neurology -Appreciate vascular surgery consult and recommendations -Appreciate neurology consult and recommendations  -Surgery scheduled for tomorrow AM -NPO at midnight  -Permissive HTN <180 per neurology  -Hold home Eliquis  - F/u ECHO  -Vitals, mNIHSS q2h for 12 hours  -Appreciate PT/OT consult and recommendations  -F/u lipid panel  -Heparin 100 units/mL -rosuvastatin 20 mg qd  CKD stage IV Cr 2.90, BUN 33. Appears to be elevated from baseline ~1.8-2.2 (1 year ago) do not have recent labs, so not sure of current baseline. Possible AKI of unknown etiology, will enquire further with patient prerenal vs obstructive. Patient was seeing nephrology when second episode of vision loss occurred. Will monitor closely  -Daily renal panel  -Consider IV hydration   HTN Patients BP is elevated on admission 138-191/61-114.  -Permissive HTN <180 as per above  -Hold home amlodipine, carvedilol until s/p surgery tomorrow   Persistent Afib s/p ablation  HFpEF Patient followed by cardiology. Rate well controlled in Afib. Appears euvolemic on exam.  -Hold home Eliquis as per above    Diet: Heart Healthy VTE: Heparin IVF: None,None Code: Full  Prior to Admission Living Arrangement: Home, living with wife Anticipated Discharge Location: Home Barriers to Discharge: Surgery tomorrow   Dispo: Admit patient  to Inpatient with expected length of stay greater than 2 midnights.  SignedRolene Course [phone]  03/10/2021, 4:42 PM   Attestation for Student Documentation:  I personally was present and performed or re-performed the history, physical exam and medical decision-making activities of this service and have verified that the service and findings are accurately documented in the student's note.  Gaylan Gerold, DO

## 2021-03-09 NOTE — ED Triage Notes (Signed)
Emergency Medicine Provider Triage Evaluation Note  [name] , a 75 y.o. male  was evaluated in triage.  Pt complains of loss of vision in left eye. Yesterday morning around 7-8AM 1/3 left lower vision loss which resolved. Today at [place] office, complete left vision loss around 1:15PM which resolved quickly. LKN 1:15PM. No history of eye issues. Denies speech changes and unilateral weakness. Previous CVA. No previous neurological deficits.   Review of Systems  Positive: Vision loss Negative: Speech changes, balance issues  Physical Exam  BP (!) 161/79 (BP Location: Right Arm)   Pulse 63   Temp 98.6 F (37 C) (Oral)   Resp 15   SpO2 99%  Gen:   Awake, no distress   HEENT:  Atraumatic  Resp:  Normal effort  Cardiac:  Normal rate  Abd:   Nondistended, nontender  MSK:   Moves extremities without difficulty  Neuro:  Facial droop on initial evaluation, grip strength equal  Medical Decision Making  Medically screening exam initiated at 2:10 PM.  Appropriate orders placed.  [name] was informed that the remainder of the evaluation will be completed by another provider, this initial triage assessment does not replace that evaluation, and the importance of remaining in the ED until their evaluation is complete.  Clinical Impression  Left vision loss. History of previous CVA. Facial droop appreciated at triage. Code stroke activated at triage. Discussed with Dr. Melina Copa who evaluated patient and is agreeable to activate code stroke.    Suzy [name], [place] 03/09/21 1418

## 2021-03-09 NOTE — Progress Notes (Addendum)
ANTICOAGULATION CONSULT NOTE - Initial Consult  Pharmacy Consult for heparin dosing Indication: Cortaid stent thrombosis, per neuro  Allergies  Allergen Reactions  . Plavix [Clopidogrel] Rash    Patient Measurements: Height: 6' (182.9 cm) Weight: 103 kg (227 lb 1.2 oz) IBW/kg (Calculated) : 77.6 Heparin Dosing Weight: 98.8  Vital Signs: Temp: 97.7 F (36.5 C) (04/05 1606) Temp Source: Oral (04/05 1606) BP: 160/89 (04/05 1630) Pulse Rate: 54 (04/05 1630)  Labs: Recent Labs    03/09/21 1415 03/09/21 1427  HGB 14.4 15.0  HCT 43.4 44.0  PLT 204  --   APTT 41*  --   LABPROT 14.9  --   INR 1.2  --   CREATININE 2.90* 3.00*    Estimated Creatinine Clearance: 26 mL/min (A) (by C-G formula based on SCr of 3 mg/dL (H)).   Medical History: Past Medical History:  Diagnosis Date  . CKD (chronic kidney disease), stage III ([place])   . Dyspnea on exertion   . Edema   . HTN (hypertension)   . Hyperlipidemia   . Left carotid stenosis    a. 2007 s/p L CEA;  b. 2011 s/p L common carotid stenting 2/2 restenosis;  c. Q000111Q u/s: RICA [id number], LICA [id number], patent LCCA stent.  [name] PAD (peripheral artery disease) (HCC)    a. s/p prior RSFA, REIA, distal LSFA (known to be occluded), LEIA, and LCIA stenting;  b. 01/2015 ABI's: R 0.78, L 0.53.  [name] Stroke [facility])     Assessment: 75 y.o. male presenting with multiple transient episodes of vision loss in left eye. His PMH is significant for left carotid stenosis s/p left CEA (2007), left common carotid stenting secondary to restenosis (2011 and 2020), prior stroke without residual deficits, PAD, Afib on apixaban 5 mg BID PTA. Pharmacy has been consulted bu neurology to start IV heparin. Patient reports last dose of apixaban was at ~ 0730 this AM. CBC wnl, baseline aPTT 41. Given recent DOAC use, will trend aPTT and HL until correlating.    Goal of Therapy:  Heparin level 0.3-0.5 units/ml aPTT 66-85 seconds Anti-Xa level 0.6-1 units/ml 4hrs after  LMWH dose given Monitor platelets by anticoagulation protocol: Yes   Plan:  Start heparin infusion at 1700 units/hr (no boluses) Check aPTT and anti-Xa level in 6 hours  Trend aPTT and anti-Xa levels until correlating  Continue to monitor H&H and platelets  Thea Gist 03/09/2021,4:52 PM

## 2021-03-09 NOTE — ED Triage Notes (Signed)
Pt here from MD office with reports of intermittent loss of vision in L eye. Pt also presents with facial asymmetry. LKW 1310.

## 2021-03-09 NOTE — Consult Note (Addendum)
Neurology Consultation  Reason for Consult: Left eye visual loss Referring Physician: Dr. Aletta Edouard  CC: Multiple transient episode of vision loss in the left eye  History is obtained from: Patient  HPI: [name] is a 75 y.o. male past medical history of CKD 3, hypertension, hyperlipidemia, left carotid stenosis status post left CEA in 2007 and left common carotid stenting secondary to restenosis in 2011 followed by another stenting required in 2020, prior history of stroke without residual deficits, peripheral arterial disease, persistent atrial fibrillation on apixaban, presented to the emergency room for evaluation of left eye vision loss. He reports an episode of vision loss in the left eye that lasted a few minutes yesterday where he says he lost a third of his vision bottom up in the left eye-he describes visual losses seen through frosted glass.  He did not make much of this and went to bed.  This morning, his symptoms recurred sometime around 1:15 PM when he was at his doctor's office and lasted again a few minutes but this time around he had lost complete vision in the left eye again feeling he describes as looking through frosted glass with inability to see anything clearly.  This also lasted a few minutes prior to resolving. He has had a history of left carotid revascularization many years ago and follows with Dr. Doren Custard in vascular surgery with yearly carotid ultrasounds. Last carotid Doppler 2020 with no stenosis of the left ICA, patent left common carotid artery stent with no evidence of restenosis.  Denies chest pain shortness of breath fevers chills.  Denies any sick contacts.  LKW: 1:15 PM tpa given?: no, symptoms resolved-NIH 0 Premorbid modified Rankin scale (mRS): 0  ROS:  ROS was performed and is negative except as noted in the HPI.   Past Medical History:  Diagnosis Date  . CKD (chronic kidney disease), stage III ([place])   . Dyspnea on exertion   . Edema   .  HTN (hypertension)   . Hyperlipidemia   . Left carotid stenosis    a. 2007 s/p L CEA;  b. 2011 s/p L common carotid stenting 2/2 restenosis;  c. [id number] u/s: RICA [id number], LICA [id number], patent LCCA stent.  [name] PAD (peripheral artery disease) (H[facility])    a. s/p prior RSFA, REIA, distal LSFA (known to be occluded), LEIA, and LCIA stenting;  b. 01/2015 ABI's: R 0.78, L 0.53.  [name] Stroke [facility])     Family History  Problem Relation Age of Onset  . Hypertension Mother   . Hyperlipidemia Mother      Social History:   reports that he has been smoking cigarettes. He has a 50.00 pack-year smoking history. He has never used smokeless tobacco. He reports current alcohol use of about 7.0 standard drinks of alcohol per week. He reports that he does not use drugs.  Medications No current facility-administered medications for this encounter.  Current Outpatient Medications:  .  albuterol (VENTOLIN HFA) 108 (90 Base) MCG/ACT inhaler, Inhale 2 puffs into the lungs every 6 (six) hours as needed (wheezing/shortness of breath). , Disp: , Rfl:  .  amLODipine (NORVASC) 10 MG tablet, Take 10 mg by mouth daily., Disp: , Rfl:  .  aspirin EC 81 MG tablet, Take 1 tablet (81 mg total) by mouth daily., Disp: 90 tablet, Rfl: 3 .  azelastine (ASTELIN) 0.1 % nasal spray, Place 1-2 sprays into both nostrils 2 (two) times daily as needed for rhinitis. Use in each nostril as directed, Disp: ,  Rfl:  .  carvedilol (COREG) 12.5 MG tablet, Take 1 tablet (12.5 mg total) by mouth 2 (two) times daily., Disp: 90 tablet, Rfl: 3 .  ELIQUIS 5 MG TABS tablet, TAKE 1 TABLET(5 MG) BY MOUTH TWICE DAILY, Disp: 180 tablet, Rfl: 3 .  furosemide (LASIX) 20 MG tablet, Take 1 tablet (20 mg total) by mouth 2 (two) times daily., Disp: 180 tablet, Rfl: 1 .  losartan-hydrochlorothiazide (HYZAAR) 50-12.5 MG tablet, Take 1 tablet by mouth daily., Disp: , Rfl:  .  omeprazole (PRILOSEC) 20 MG capsule, Take 20 mg by mouth daily with breakfast. , Disp: , Rfl:  .   oxyCODONE-acetaminophen (PERCOCET) 7.5-325 MG tablet, Take 0.5 tablets by mouth every 5 (five) hours as needed for severe pain. , Disp: , Rfl:  .  potassium chloride (KLOR-CON) 10 MEQ tablet, TAKE 1 TABLET(10 MEQ) BY MOUTH DAILY, Disp: 90 tablet, Rfl: 1 .  rosuvastatin (CRESTOR) 10 MG tablet, Take 10 mg by mouth every evening. , Disp: , Rfl:  .  TRELEGY ELLIPTA 100-62.5-25 MCG/INH AEPB, Inhale 1 puff into the lungs at bedtime. , Disp: , Rfl:    Exam: Current vital signs: BP (!) 171/69   Pulse 64   Temp 98.6 F (37 C) (Oral)   Resp 12   Wt 103 kg   SpO2 98%   BMI 29.15 kg/m  Vital signs in last 24 hours: Temp:  [98.6 F (37 C)] 98.6 F (37 C) (04/05 1409) Pulse Rate:  [63-64] 64 (04/05 1444) Resp:  [12-15] 12 (04/05 1444) BP: (161-171)/(69-79) 171/69 (04/05 1443) SpO2:  [98 %-99 %] 98 % (04/05 1444) Weight:  [103 kg] 103 kg (04/05 1426)  GENERAL: Awake, alert in NAD HEENT: - Normocephalic and atraumatic, dry mm, no LN++, no Thyromegally LUNGS - Clear to auscultation bilaterally with no wheezes CV - S1S2 RRR, no m/r/g, equal pulses bilaterally. ABDOMEN - Soft, nontender, nondistended with normoactive BS Extremities warm well perfused without edema  NEURO:  Mental Status: AA&Ox3  Language: speech is clear and not dysarthric naming, repetition, fluency, and comprehension intact. Cranial Nerves: PERRL____mm/brisk. EOMI, visual fields full, no facial asymmetry, facial sensation intact, hearing intact, tongue/uvula/soft palate midline, normalsternocleidomastoid and trapezius muscle strength. No evidence of tongue atrophy or fibrillations Motor: 5/5-no drift in any of the 4 extremities Tone: is normal and bulk is normal Sensation- Intact to light touch bilaterally Coordination: FTN intact bilaterally, no ataxia in BLE. Gait- deferred  NIHSS-0   Labs I have reviewed labs in epic and the results pertinent to this consultation are:   CBC    Component Value Date/Time   WBC  12.4 (H) 03/09/2021 1415   RBC 4.64 03/09/2021 1415   HGB 15.0 03/09/2021 1427   HGB 14.2 10/29/2019 0918   HCT 44.0 03/09/2021 1427   HCT 41.1 10/29/2019 0918   PLT 204 03/09/2021 1415   PLT 168 10/29/2019 0918   MCV 93.5 03/09/2021 1415   MCV 89 10/29/2019 0918   MCH 31.0 03/09/2021 1415   MCHC 33.2 03/09/2021 1415   RDW 14.6 03/09/2021 1415   RDW 12.3 10/29/2019 0918   LYMPHSABS 2.8 03/09/2021 1415   MONOABS 1.0 03/09/2021 1415   EOSABS 0.5 03/09/2021 1415   BASOSABS 0.1 03/09/2021 1415    CMP     Component Value Date/Time   NA 134 (L) 03/09/2021 1427   NA 140 03/31/2020 1244   K 4.4 03/09/2021 1427   CL 104 03/09/2021 1427   CO2 21 03/31/2020 1244   GLUCOSE  102 (H) 03/09/2021 1427   BUN 40 (H) 03/09/2021 1427   BUN 24 03/31/2020 1244   CREATININE 3.00 (H) 03/09/2021 1427   CALCIUM 8.4 (L) 03/31/2020 1244   PROT 6.2 03/13/2012 0836   ALBUMIN 3.2 (L) 03/13/2012 0836   AST 25 03/13/2012 0836   ALT 17 03/13/2012 0836   ALKPHOS 65 03/13/2012 0836   BILITOT 0.4 03/13/2012 0836   GFRNONAA 28 (L) 03/31/2020 1244   GFRAA 33 (L) 03/31/2020 1244    Lipid Panel     Component Value Date/Time   CHOL 194 03/13/2012 0836   TRIG 341.0 (H) 03/13/2012 0836   HDL 35.90 (L) 03/13/2012 0836   CHOLHDL 5 03/13/2012 0836   VLDL 68.2 (H) 03/13/2012 0836   LDLDIRECT 98.5 03/13/2012 0836     Imaging I have reviewed the images obtained:  CT-scan of the brain-no acute changes.  Aspects 10 CTA head and neck with a filling defect in the distal stent-question acute thrombus-it is causing significant stenosis.  Small mid left cerebral artery compared to the right likely due to hypoperfusion.  Most of the left vertebral artery which has its origin from the arch.  Assessment:  75 year old with multiple cerebrovascular risk factors including left carotid stenosis status post CEA and stenting 2011 and 2020 due to stent thrombosis, presenting with complaints of intermittent vision loss  which is painless in his left eye. He reports an episode yesterday where he lost about third of his vision and then altitudinal pattern before his symptoms completely resolved in few minutes.  This afternoon also he reported an episode where he lost vision in his left eye completely-describes the episode as looking through frosted glass-which again has now completely resolved. CT head unremarkable for acute process CTA head and neck with the left common carotid stent by the bifurcation with distal part of the stent showing filling defect which might be an acute thrombus causing significant narrowing. His symptoms likely reflect fibrosis fugax. tPA not given due to NIH 0 Not an endovascular candidate due to NIH 0. That said, I will get a stat MRI to look for any evidence of stroke in the left hemisphere. After that, I will contact vascular surgery, Dr. Mee Hives team, to discuss revascularization-based on MRI findings.   Impression: Amaurosis fugax left eye Symptomatic left internal carotid artery stenosis  Recommendations: Stat MRI Further decisions based on MRI result as above. Plan discussed with Dr. Melina Copa I will follow the MRI results and further recommendations with you.  -- Amie Portland, MD Neurologist [facility] Pager: [phone]    ADDENDUM MRI brain completed and reviewed. Punctate left cerebellar infarct-not consistent with the left carotid distribution. Visual symptoms likely secondary to the symptomatic carotid. Spoke with Dr. Donnetta Hutching from vascular surgery. He will evaluate the patient shortly given multiple retinal events in the past day.  IMPRESSION -Left carotid symptomatic stenosis due to stent thrombosis possibly causing amaurosis fugax/retinal TIAs -Incidental Left cerebellar stroke.  Stroke w/u to include:  Tele  Echo  A1c  Lipid panel  PT OT  NPO till cleared by swallow eval  In the interim, we will heparinize the patient-pharmacy  consult placed.  Recommendations on revascularization after vascular surgery evaluation.  I discussed the plan with the patient.  I discussed the importance of smoking cessation in extreme detail.  He is currently not very willing to quit smoking but will consider.  Stroke team will follow with you tomorrow. Plan discussed with Dr. Melina Copa and Dr. Tawni Millers. D/W IM resident  physician on call.     CRITICAL CARE ATTESTATION Performed by: Amie Portland, MD Total critical care time: 45mnutes Critical care time was exclusive of separately billable procedures and treating other patients and/or supervising APPs/Residents/Students Critical care was necessary to treat or prevent imminent or life-threatening deterioration due to acute strokelike symptoms evaluation.  Carotid stenosis. This patient is critically ill and at significant risk for neurological worsening and/or death and care requires constant monitoring. Critical care was time spent personally by me on the following activities: development of treatment plan with patient and/or surrogate as well as nursing, discussions with consultants, evaluation of patient's response to treatment, examination of patient, obtaining history from patient or surrogate, ordering and performing treatments and interventions, ordering and review of laboratory studies, ordering and review of radiographic studies, pulse oximetry, re-evaluation of patient's condition, participation in multidisciplinary rounds and medical decision making of high complexity in the care of this patient.

## 2021-03-09 NOTE — ED Notes (Signed)
Pt o2 saturation dropped to 82% on room air while sleeping. Placed on 2L [place] - o2 sat 94%

## 2021-03-09 NOTE — ED Notes (Signed)
Pt. Food should be arriving at 19:40

## 2021-03-09 NOTE — ED Notes (Signed)
RN paged Dr. Sherry Ruffing per pt ventricular tachycardia on telemonitor

## 2021-03-09 NOTE — ED Notes (Signed)
[facility] called at 1418 to activate [facility] [place]

## 2021-03-09 NOTE — ED Provider Notes (Signed)
[facility]ERG[facility]Y DEPARTMENT Provider Note   CSN: [id number] Arrival date & time: 03/09/21  1402     History Chief Complaint  Patient presents with  . Code Stroke    [name] is a 75 y.o. male.  He is here with a complaint of visual loss on the left eye.  Initial episode happened yesterday and lasted a few minutes.  It was not complete, he calls it a third of his vision.  He again experienced visual loss 100% left eye.  Lasted a few minutes again.  Currently not having any visual symptoms difficulty with speech or weakness.  No balance issues.  No headache.  The history is provided by the patient.  Cerebrovascular Accident This is a new problem. The current episode started less than 1 hour ago. The problem has been resolved. Pertinent negatives include no chest pain, no abdominal pain, no headaches and no shortness of breath. Nothing aggravates the symptoms. Nothing relieves the symptoms. He has tried nothing for the symptoms. The treatment provided significant relief.       Past Medical History:  Diagnosis Date  . CKD (chronic kidney disease), stage III ([place])   . Dyspnea on exertion   . Edema   . HTN (hypertension)   . Hyperlipidemia   . Left carotid stenosis    a. 2007 s/p L CEA;  b. 2011 s/p L common carotid stenting 2/2 restenosis;  c. [id number] u/s: RICA [id number], LICA [id number], patent LCCA stent.  [name] PAD (peripheral artery disease) (HCC)    a. s/p prior RSFA, REIA, distal LSFA (known to be occluded), LEIA, and LCIA stenting;  b. 01/2015 ABI's: R 0.78, L 0.53.  [name] Stroke [facility])     Patient Active Problem List   Diagnosis Date Noted  . Persistent atrial fibrillation ([place])   . Left carotid artery stenosis 08/02/2019  . Dyspnea on exertion   . CKD (chronic kidney disease), stage III ([place])   . PAD (peripheral artery disease) ([place])   . Hyperlipidemia   . HTN (hypertension)   . Left carotid stenosis   . Occlusion and stenosis of carotid artery without  mention of cerebral infarction 03/06/2012  . HYPERTENSION, BENIGN 03/12/2009  . Atherosclerosis of native arteries of extremity with intermittent claudication ([place]) 03/12/2009    Past Surgical History:  Procedure Laterality Date  . ABDOMINAL AORTAGRAM N/A 02/05/2015   Procedure: ABDOMINAL Maxcine Ham;  Surgeon: Conrad Lynnwood, MD;  Location: [facility] CATH LAB;  Service: Cardiovascular;  Laterality: N/A;  . AORTIC ARCH ANGIOGRAPHY N/A 07/19/2019   Procedure: AORTIC ARCH ANGIOGRAPHY;  Surgeon: Elam Dutch, MD;  Location: [facility] CV LAB;  Service: Cardiovascular;  Laterality: N/A;  . CARDIOVERSION N/A 11/06/2019   Procedure: CARDIOVERSION;  Surgeon: Pixie Casino, MD;  Location: [facility] ENDOSCOPY;  Service: Cardiovascular;  Laterality: N/A;  . CAROTID ANGIOGRAPHY N/A 07/19/2019   Procedure: CAROTID ANGIOGRAPHY;  Surgeon: Elam Dutch, MD;  Location: [place] CV LAB;  Service: Cardiovascular;  Laterality: N/A;  . carotid endarterecotomy    . CAROTID PTA/STENT INTERVENTION Left 08/02/2019   Procedure: CAROTID PTA/STENT INTERVENTION;  Surgeon: Elam Dutch, MD;  Location: [place] CV LAB;  Service: Cardiovascular;  Laterality: Left;  . external iliac     status post bilateral and left common iliac artery  . lumbar back surgery     x2  . LUMBAR DISC SURGERY     x 2  . stnt     lower extermity  .  TENDON REPAIR     to the left arm  . TONSILLECTOMY         Family History  Problem Relation Age of Onset  . Hypertension Mother   . Hyperlipidemia Mother     Social History   Tobacco Use  . Smoking status: Current Every Day Smoker    Packs/day: 1.00    Years: 50.00    Pack years: 50.00    Types: Cigarettes  . Smokeless tobacco: Never Used  Vaping Use  . Vaping Use: Never used  Substance Use Topics  . Alcohol use: Yes    Alcohol/week: 7.0 standard drinks    Types: 7 Glasses of wine per week  . Drug use: No    Home Medications Prior to Admission medications    Medication Sig Start Date End Date Taking? Authorizing Provider  albuterol (VENTOLIN HFA) 108 (90 Base) MCG/ACT inhaler Inhale 2 puffs into the lungs every 6 (six) hours as needed (wheezing/shortness of breath).  10/05/19   [provider]  amLODipine (NORVASC) 10 MG tablet Take 10 mg by mouth daily. 01/17/20   [provider]  aspirin EC 81 MG tablet Take 1 tablet (81 mg total) by mouth daily. 08/22/19   Sherren Mocha, MD  azelastine (ASTELIN) 0.1 % nasal spray Place 1-2 sprays into both nostrils 2 (two) times daily as needed for rhinitis. Use in each nostril as directed    [provider]  carvedilol (COREG) 12.5 MG tablet Take 1 tablet (12.5 mg total) by mouth 2 (two) times daily. 11/07/19   Kathlen Mody, Scott T, PA-C  ELIQUIS 5 MG TABS tablet TAKE 1 TABLET(5 MG) BY MOUTH TWICE DAILY 08/03/20   Sherren Mocha, MD  furosemide (LASIX) 20 MG tablet Take 1 tablet (20 mg total) by mouth 2 (two) times daily. 03/24/20   Tommie Raymond, NP  losartan-hydrochlorothiazide (HYZAAR) 50-12.5 MG tablet Take 1 tablet by mouth daily. 01/01/20   [provider]  omeprazole (PRILOSEC) 20 MG capsule Take 20 mg by mouth daily with breakfast.  11/30/14   [provider]  oxyCODONE-acetaminophen (PERCOCET) 7.5-325 MG tablet Take 0.5 tablets by mouth every 5 (five) hours as needed for severe pain.     [provider]  potassium chloride (KLOR-CON) 10 MEQ tablet TAKE 1 TABLET(10 MEQ) BY MOUTH DAILY 11/03/20   Sherren Mocha, MD  rosuvastatin (CRESTOR) 10 MG tablet Take 10 mg by mouth every evening.     [provider]  TRELEGY ELLIPTA 100-62.5-25 MCG/INH AEPB Inhale 1 puff into the lungs at bedtime.  08/17/19   [provider]    Allergies    Plavix [clopidogrel]  Review of Systems   Review of Systems  Constitutional: Negative for fever.  HENT: Negative for sore throat.   Eyes: Positive for visual disturbance.  Respiratory: Negative for shortness  of breath.   Cardiovascular: Negative for chest pain.  Gastrointestinal: Negative for abdominal pain.  Genitourinary: Negative for dysuria.  Musculoskeletal: Negative for neck pain.  Skin: Negative for rash.  Neurological: Negative for speech difficulty, weakness, numbness and headaches.    Physical Exam Updated Vital Signs BP (!) 161/79 (BP Location: Right Arm)   Pulse 63   Temp 98.6 F (37 C) (Oral)   Resp 15   Wt 103 kg   SpO2 99%   BMI 29.15 kg/m   Physical Exam Vitals and nursing note reviewed.  Constitutional:      Appearance: Normal appearance. He is well-developed.  HENT:  Head: Normocephalic and atraumatic.  Eyes:     Conjunctiva/sclera: Conjunctivae normal.  Cardiovascular:     Rate and Rhythm: Normal rate and regular rhythm.     Heart sounds: No murmur heard.   Pulmonary:     Effort: Pulmonary effort is normal. No respiratory distress.     Breath sounds: Normal breath sounds.  Abdominal:     Palpations: Abdomen is soft.     Tenderness: There is no abdominal tenderness.  Musculoskeletal:        General: No deformity or signs of injury. Normal range of motion.     Cervical back: Neck supple.  Skin:    General: Skin is warm and dry.  Neurological:     General: No focal deficit present.     Mental Status: He is alert and oriented to person, place, and time.     Cranial Nerves: No cranial nerve deficit.     Sensory: No sensory deficit.     Motor: No weakness.     Gait: Gait normal.     ED Results / Procedures / Treatments   Labs (all labs ordered are listed, but only abnormal results are displayed) Labs Reviewed  APTT - Abnormal; Notable for the following components:      Result Value   aPTT 41 (*)    All other components within normal limits  CBC - Abnormal; Notable for the following components:   WBC 12.4 (*)    All other components within normal limits  DIFFERENTIAL - Abnormal; Notable for the following components:   Neutro Abs 8.1 (*)     All other components within normal limits  COMPREHENSIVE METABOLIC PANEL - Abnormal; Notable for the following components:   Sodium 133 (*)    CO2 21 (*)    Glucose, Bld 106 (*)    BUN 33 (*)    Creatinine, Ser 2.90 (*)    Total Protein 6.0 (*)    Albumin 3.0 (*)    GFR, Estimated 22 (*)    All other components within normal limits  I-STAT CHEM 8, ED - Abnormal; Notable for the following components:   Sodium 134 (*)    BUN 40 (*)    Creatinine, Ser 3.00 (*)    Glucose, Bld 102 (*)    Calcium, Ion 1.09 (*)    TCO2 21 (*)    All other components within normal limits  CBG MONITORING, ED - Abnormal; Notable for the following components:   Glucose-Capillary 101 (*)    All other components within normal limits  RESP PANEL BY RT-PCR (FLU A&B, COVID) ARPGX2  ETHANOL  PROTIME-INR  RAPID URINE DRUG SCREEN, HOSP PERFORMED  URINALYSIS, ROUTINE W REFLEX MICROSCOPIC  APTT  HEPARIN LEVEL (UNFRACTIONATED)  HEMOGLOBIN A1C  LIPID PANEL  CBC  BASIC METABOLIC PANEL    EKG EKG Interpretation  Date/Time:  Tuesday March 09 2021 14:43:51 EDT Ventricular Rate:  78 PR Interval:    QRS Duration: 126 QT Interval:  446 QTC Calculation: 450 R Axis:   43 Text Interpretation: Atrial fibrillation Nonspecific intraventricular conduction delay Anteroseptal infarct, old Confirmed by Aletta Edouard [phone]) on 03/09/2021 2:47:30 PM   Radiology CT ANGIO HEAD W OR WO CONTRAST  Result Date: 03/09/2021 CLINICAL DATA:  Acute neuro deficit. Left eye monocular blindness, resolved. History of left carotid stent 2020 EXAM: CT ANGIOGRAPHY HEAD AND NECK TECHNIQUE: Multidetector CT imaging of the head and neck was performed using the standard protocol during bolus administration of intravenous contrast. Multiplanar CT  image reconstructions and MIPs were obtained to evaluate the vascular anatomy. Carotid stenosis measurements (when applicable) are obtained utilizing NASCET criteria, using the distal internal  carotid diameter as the denominator. CONTRAST:  77m OMNIPAQUE IOHEXOL 350 MG/ML SOLN COMPARISON:  CT head 03/09/2021 FINDINGS: CTA NECK FINDINGS Aortic arch: Atherosclerotic calcification aortic arch without aneurysm. Left vertebral artery origin from the arch with a moderate stenosis at the origin. Right carotid system: Mild atherosclerotic disease in the right common carotid artery and right carotid bifurcation without significant stenosis. Left carotid system: Stent across the left carotid bifurcation. There is a filling defect within the lumen of the stent distally likely due to thrombus or atherosclerotic plaque. This is causing significant narrowing of the lumen however there is opacification of the internal carotid artery above the stent. There is a moderate stenosis of the origin of the left external carotid artery. Vertebral arteries: Mild atherosclerotic disease proximal right vertebral artery without stenosis. Right vertebral artery patent to the basilar Left vertebral origin from the arch with a moderate stenosis at the origin. Left vertebral artery is then patent to the basilar without additional stenosis. Skeleton: Cervical spondylosis.  No acute skeletal abnormality. Other neck: Negative for mass or adenopathy Upper chest: Lung apices clear bilaterally. Review of the MIP images confirms the above findings CTA HEAD FINDINGS Anterior circulation: Mild atherosclerotic calcification in the cavernous carotid bilaterally without stenosis. Anterior cerebral arteries patent bilaterally. Right middle cerebral artery widely patent Decreased caliber of the left middle cerebral artery compared to the right likely due to hypoperfusion. No focal stenosis or large vessel occlusion. Posterior circulation: Both vertebral arteries patent to the basilar. PICA patent bilaterally. Basilar widely patent. AICA, superior cerebellar, and posterior cerebral arteries patent without stenosis or large vessel occlusion. Venous  sinuses: Normal venous enhancement. Right transverse sinus dominant. Small left transverse sinus. Anatomic variants: None Review of the MIP images confirms the above findings IMPRESSION: 1. Left carotid stenting across the bifurcation. The stent is patent however there is filling defect within the distal stent which may be due to acute thrombus or atherosclerotic plaque. This appears to be causing significant stenosis. 2. Small left middle cerebral artery compared to the right likely due to hypoperfusion. No intracranial large vessel occlusion 3. Atherosclerotic aorta. Mild atherosclerotic disease right carotid bifurcation. 4. Moderate stenosis left vertebral artery which has origin from the arch. 5. These results were called by telephone at the time of interpretation on 03/09/2021 at 2:49 pm to [facility], who verbally acknowledged these results. Electronically Signed   By: CFranchot GalloM.D.   On: 03/09/2021 14:51   CT ANGIO NECK W OR WO CONTRAST  Result Date: 03/09/2021 CLINICAL DATA:  Acute neuro deficit. Left eye monocular blindness, resolved. History of left carotid stent 2020 EXAM: CT ANGIOGRAPHY HEAD AND NECK TECHNIQUE: Multidetector CT imaging of the head and neck was performed using the standard protocol during bolus administration of intravenous contrast. Multiplanar CT image reconstructions and MIPs were obtained to evaluate the vascular anatomy. Carotid stenosis measurements (when applicable) are obtained utilizing NASCET criteria, using the distal internal carotid diameter as the denominator. CONTRAST:  77mOMNIPAQUE IOHEXOL 350 MG/ML SOLN COMPARISON:  CT head 03/09/2021 FINDINGS: CTA NECK FINDINGS Aortic arch: Atherosclerotic calcification aortic arch without aneurysm. Left vertebral artery origin from the arch with a moderate stenosis at the origin. Right carotid system: Mild atherosclerotic disease in the right common carotid artery and right carotid bifurcation without significant  stenosis. Left carotid system: Stent across  the left carotid bifurcation. There is a filling defect within the lumen of the stent distally likely due to thrombus or atherosclerotic plaque. This is causing significant narrowing of the lumen however there is opacification of the internal carotid artery above the stent. There is a moderate stenosis of the origin of the left external carotid artery. Vertebral arteries: Mild atherosclerotic disease proximal right vertebral artery without stenosis. Right vertebral artery patent to the basilar Left vertebral origin from the arch with a moderate stenosis at the origin. Left vertebral artery is then patent to the basilar without additional stenosis. Skeleton: Cervical spondylosis.  No acute skeletal abnormality. Other neck: Negative for mass or adenopathy Upper chest: Lung apices clear bilaterally. Review of the MIP images confirms the above findings CTA HEAD FINDINGS Anterior circulation: Mild atherosclerotic calcification in the cavernous carotid bilaterally without stenosis. Anterior cerebral arteries patent bilaterally. Right middle cerebral artery widely patent Decreased caliber of the left middle cerebral artery compared to the right likely due to hypoperfusion. No focal stenosis or large vessel occlusion. Posterior circulation: Both vertebral arteries patent to the basilar. PICA patent bilaterally. Basilar widely patent. AICA, superior cerebellar, and posterior cerebral arteries patent without stenosis or large vessel occlusion. Venous sinuses: Normal venous enhancement. Right transverse sinus dominant. Small left transverse sinus. Anatomic variants: None Review of the MIP images confirms the above findings IMPRESSION: 1. Left carotid stenting across the bifurcation. The stent is patent however there is filling defect within the distal stent which may be due to acute thrombus or atherosclerotic plaque. This appears to be causing significant stenosis. 2. Small left  middle cerebral artery compared to the right likely due to hypoperfusion. No intracranial large vessel occlusion 3. Atherosclerotic aorta. Mild atherosclerotic disease right carotid bifurcation. 4. Moderate stenosis left vertebral artery which has origin from the arch. 5. These results were called by telephone at the time of interpretation on 03/09/2021 at 2:49 pm to provider [facility] , who verbally acknowledged these results. Electronically Signed   By: Franchot Gallo M.D.   On: 03/09/2021 14:51   MR BRAIN WO CONTRAST  Result Date: 03/09/2021 CLINICAL DATA:  Neuro deficit, acute, stroke suspected. EXAM: MRI HEAD WITHOUT CONTRAST TECHNIQUE: Multiplanar, multiecho pulse sequences of the brain and surrounding structures were obtained without intravenous contrast. COMPARISON:  Head CT March 09, 2021. FINDINGS: Brain: Small focus of restricted diffusion within the left cerebellar hemisphere consistent with acute/subacute infarct. Remote lacunar infarct in the right corona radiata. Scattered foci of T2 hyperintensity within the white matter of the cerebral hemispheres, nonspecific, most likely related to chronic small vessel ischemia. No hemorrhage, hydrocephalus, extra-axial collection or mass lesion. Vascular: Normal flow voids. Skull and upper cervical spine: Normal marrow signal. Sinuses/Orbits: Negative. Other: Left mastoid effusion. IMPRESSION: 1. Small acute/subacute infarct within the left cerebellar hemisphere. 2. Remote lacunar infarct in the right corona radiata. 3. Mild chronic small vessel ischemia. 4. Left mastoid effusion. These results were called by secure text paging at the time of interpretation on 03/09/2021 at 3:55 pm to provider [place] . Electronically Signed   By: Pedro Earls M.D.   On: 03/09/2021 15:57   CT HEAD CODE STROKE WO CONTRAST  Result Date: 03/09/2021 CLINICAL DATA:  Code stroke. Acute neuro deficit. Vision problem left eye. EXAM: CT HEAD WITHOUT CONTRAST  TECHNIQUE: Contiguous axial images were obtained from the base of the skull through the vertex without intravenous contrast. COMPARISON:  None. FINDINGS: Brain: No evidence of acute infarction, hemorrhage, hydrocephalus, extra-axial  collection or mass lesion/mass effect. Vascular: Negative for hyperdense vessel Skull: Negative Sinuses/Orbits: Negative Other: None ASPECTS ([place] Stroke Program Early CT Score) - Ganglionic level infarction (caudate, lentiform nuclei, internal capsule, insula, M1-M3 cortex): 7 - Supraganglionic infarction (M4-M6 cortex): 3 Total score (0-10 with 10 being normal): 10 IMPRESSION: 1. No acute intracranial abnormality 2. ASPECTS is 10 3. Code stroke imaging results were communicated on 03/09/2021 at 2:36 pm to provider Rory Percy via text page Electronically Signed   By: Franchot Gallo M.D.   On: 03/09/2021 14:37    Procedures .Critical Care Performed by: Hayden Rasmussen, MD Authorized by: Hayden Rasmussen, MD   Critical care provider statement:    Critical care time (minutes):  45   Critical care time was exclusive of:  Separately billable procedures and treating other patients   Critical care was necessary to treat or prevent imminent or life-threatening deterioration of the following conditions:  CNS failure or compromise   Critical care was time spent personally by me on the following activities:  Discussions with consultants, evaluation of patient's response to treatment, examination of patient, ordering and performing treatments and interventions, ordering and review of laboratory studies, ordering and review of radiographic studies, pulse oximetry, re-evaluation of patient's condition, obtaining history from patient or surrogate, review of old charts and development of treatment plan with patient or surrogate     Medications Ordered in ED Medications  heparin ADULT infusion 100 units/mL (25000 units/263m) (1,700 Units/hr Intravenous New Bag/Given 03/09/21 1741)    stroke: mapping our early stages of recovery book (has no administration in time range)  acetaminophen (TYLENOL) tablet 650 mg (has no administration in time range)    Or  acetaminophen (TYLENOL) 160 MG/5ML solution 650 mg (has no administration in time range)    Or  acetaminophen (TYLENOL) suppository 650 mg (has no administration in time range)  senna-docusate (Senokot-S) tablet 1 tablet (has no administration in time range)  rosuvastatin (CRESTOR) tablet 20 mg (has no administration in time range)  ipratropium-albuterol (DUONEB) 0.5-2.5 (3) MG/3ML nebulizer solution 3 mL (has no administration in time range)  umeclidinium bromide (INCRUSE ELLIPTA) 62.5 MCG/INH 1 puff (1 puff Inhalation Not Given 03/09/21 1830)  fluticasone furoate-vilanterol (BREO ELLIPTA) 100-25 MCG/INH 1 puff (1 puff Inhalation Not Given 03/09/21 1830)  iohexol (OMNIPAQUE) 350 MG/ML injection 75 mL (75 mLs Intravenous Contrast Given 03/09/21 1437)    ED Course  I have reviewed the triage vital signs and the nursing notes.  Pertinent labs & imaging results that were available during my care of the patient were reviewed by me and considered in my medical decision making (see chart for details).  Clinical Course as of 03/09/21 1911  Tue Mar 09, 2021  1447 Current recommendations from neurology Dr. APennelope Brackenneeds an MRI.  They do see a filling defect in the left carotid where he been stented in the past.  May need vascular input regarding heparinization depending on results of MRI. [MB]  1448 Received a phone call from Dr. ARory Percy  MRI does show an acute stroke although not in the distribution he was expecting.  He is recommending medical admission and he is also get a talk to vascular regarding heparinization.  Unassigned medical admission has been paged.  Patient updated on plan. [MB]  1608 Discussed with internal medicine teaching service who will evaluate the patient for admission. [MB]    Clinical Course User Index [MB]  BHayden Rasmussen MD   MDM Rules/Calculators/A&P  This patient complains of brief loss of vision in left eye x2; this involves an extensive number of treatment Options and is a complaint that carries with it a high risk of complications and Morbidity. The differential includes amaurosis fugax, stroke, bleed, retinal detachments  I ordered, reviewed and interpreted labs, which included CBC with mildly elevated white count, stable hemoglobin, chemistries fairly unremarkable other than chronically elevated creatinine  I ordered imaging studies which included CT head and I independently    visualized and interpreted imaging which showed no acute infarcts Previous records obtained and reviewed in epic, no recent admissions I consulted neuro hospitalist Dr. Rory Percy And discussed lab and imaging findings  Critical Interventions: Work-up and management of patient's acute stroke likely embolic  After the interventions stated above, I reevaluated the patient and found patient to be currently asymptomatic.  He understands the need for admission for continued work-up.   Final Clinical Impression(s) / ED Diagnoses Final diagnoses:  Cerebrovascular accident (CVA), unspecified mechanism [facility])    Rx / [place] Orders ED Discharge Orders    None       Hayden Rasmussen, MD 03/09/21 1914

## 2021-03-09 NOTE — ED Notes (Signed)
Help get patient undressed into a gown on the monitor did ekg shown to Dr Melina Copa patient is resting with call bell in reach and nurse at bedside

## 2021-03-09 NOTE — Consult Note (Addendum)
Hospital Consult    Reason for Consult:  Carotid stenosis/TIA Requesting Physician:  Dr. Rory Percy MRN #:  [id number]  History of Present Illness: This is a 75 y.o. male was being seen today at Dr. Jason Nest office with history of CKD when he reported loss of sight in left eye for 4 minutes. He proceeded to the ED where he is interviewed and examined. His wife is at bedside. He states he lost approximately one-third of his left visual field yesterday morning lasting for 2 minutes. He denies pain, slurred speech confusion, extremity numbness or weakness. He denies CP or headache. He is currently at his neurologic baseline.  He underwent left CEA in 2007 for asymptomatic left ICS stenosis by Dr. Oneida Alar.  He then underwent left TCAR due to left ICA restenosis in 2011 and again in 2020.  History of CKD, atrial fibrillation on Eliquis Smokes approximately one PPD.  Past Medical History:  Diagnosis Date  . CKD (chronic kidney disease), stage III ([place])   . Dyspnea on exertion   . Edema   . HTN (hypertension)   . Hyperlipidemia   . Left carotid stenosis    a. 2007 s/p L CEA;  b. 2011 s/p L common carotid stenting 2/2 restenosis;  c. Q000111Q u/s: RICA [id number], LICA [id number], patent LCCA stent.  [name] PAD (peripheral artery disease) (HCC)    a. s/p prior RSFA, REIA, distal LSFA (known to be occluded), LEIA, and LCIA stenting;  b. 01/2015 ABI's: R 0.78, L 0.53.  [name] Stroke [facility])     Past Surgical History:  Procedure Laterality Date  . ABDOMINAL AORTAGRAM N/A 02/05/2015   Procedure: ABDOMINAL Maxcine Ham;  Surgeon: Conrad [place], MD;  Location: [facility] CATH LAB;  Service: Cardiovascular;  Laterality: N/A;  . AORTIC ARCH ANGIOGRAPHY N/A 07/19/2019   Procedure: AORTIC ARCH ANGIOGRAPHY;  Surgeon: Elam Dutch, MD;  Location: [place] CV LAB;  Service: Cardiovascular;  Laterality: N/A;  . CARDIOVERSION N/A 11/06/2019   Procedure: CARDIOVERSION;  Surgeon: Pixie Casino, MD;  Location: [facility] ENDOSCOPY;  Service:  Cardiovascular;  Laterality: N/A;  . CAROTID ANGIOGRAPHY N/A 07/19/2019   Procedure: CAROTID ANGIOGRAPHY;  Surgeon: Elam Dutch, MD;  Location: [place] CV LAB;  Service: Cardiovascular;  Laterality: N/A;  . carotid endarterecotomy    . CAROTID PTA/STENT INTERVENTION Left 08/02/2019   Procedure: CAROTID PTA/STENT INTERVENTION;  Surgeon: Elam Dutch, MD;  Location: [place] CV LAB;  Service: Cardiovascular;  Laterality: Left;  . external iliac     status post bilateral and left common iliac artery  . lumbar back surgery     x2  . LUMBAR DISC SURGERY     x 2  . stnt     lower extermity  . TENDON REPAIR     to the left arm  . TONSILLECTOMY      Allergies  Allergen Reactions  . Plavix [Clopidogrel] Rash    Prior to Admission medications   Medication Sig Start Date End Date Taking? Authorizing Provider  albuterol (VENTOLIN HFA) 108 (90 Base) MCG/ACT inhaler Inhale 2 puffs into the lungs every 6 (six) hours as needed (wheezing/shortness of breath).  10/05/19  Yes [provider]  allopurinol (ZYLOPRIM) 300 MG tablet Take 300 mg by mouth daily. 01/26/21  Yes [provider]  amLODipine (NORVASC) 10 MG tablet Take 10 mg by mouth daily. 01/17/20  Yes [provider]  aspirin EC 81 MG tablet Take 1 tablet (81 mg total) by mouth daily. Patient taking  differently: Take 81 mg by mouth at bedtime. 08/22/19  Yes Sherren Mocha, MD  azelastine (ASTELIN) 0.1 % nasal spray Place 1-2 sprays into both nostrils 2 (two) times daily as needed for rhinitis. Use in each nostril as directed   Yes [provider]  carvedilol (COREG) 12.5 MG tablet Take 1 tablet (12.5 mg total) by mouth 2 (two) times daily. 11/07/19  Yes Weaver, Scott T, PA-C  ELIQUIS 5 MG TABS tablet TAKE 1 TABLET(5 MG) BY MOUTH TWICE DAILY Patient taking differently: Take 5 mg by mouth 2 (two) times daily. 08/03/20  Yes Sherren Mocha, MD  furosemide (LASIX) 40 MG tablet Take 40 mg by mouth  as needed for fluid (swelling). 10/06/20  Yes [provider]  gabapentin (NEURONTIN) 300 MG capsule Take 300 mg by mouth 2 (two) times daily. 02/26/21  Yes [provider]  losartan-hydrochlorothiazide (HYZAAR) 50-12.5 MG tablet Take 1 tablet by mouth daily. 01/01/20  Yes [provider]  omeprazole (PRILOSEC) 20 MG capsule Take 20 mg by mouth daily with breakfast.  11/30/14  Yes [provider]  oxyCODONE-acetaminophen (PERCOCET) 7.5-325 MG tablet Take 0.5 tablets by mouth every 5 (five) hours as needed for severe pain.    Yes [provider]  potassium chloride (KLOR-CON) 10 MEQ tablet TAKE 1 TABLET(10 MEQ) BY MOUTH DAILY Patient taking differently: Take 10 mEq by mouth daily. 11/03/20  Yes Sherren Mocha, MD  rosuvastatin (CRESTOR) 10 MG tablet Take 10 mg by mouth every evening.    Yes [provider]  TRELEGY ELLIPTA 100-62.5-25 MCG/INH AEPB Inhale 1 puff into the lungs at bedtime.  08/17/19  Yes [provider]    Social History   Socioeconomic History  . Marital status: Married    Spouse name: Not on file  . Number of children: Not on file  . Years of education: Not on file  . Highest education level: Not on file  Occupational History  . Not on file  Tobacco Use  . Smoking status: Current Every Day Smoker    Packs/day: 1.00    Years: 50.00    Pack years: 50.00    Types: Cigarettes  . Smokeless tobacco: Never Used  Vaping Use  . Vaping Use: Never used  Substance and Sexual Activity  . Alcohol use: Yes    Alcohol/week: 7.0 standard drinks    Types: 7 Glasses of wine per week  . Drug use: No  . Sexual activity: Not on file  Other Topics Concern  . Not on file  Social History Narrative  . Not on file   Social Determinants of Health   Financial Resource Strain: Not on file  Food Insecurity: Not on file  Transportation Needs: Not on file  Physical Activity: Not on file  Stress: Not on file  Social  Connections: Not on file  Intimate Partner Violence: Not on file     Family History  Problem Relation Age of Onset  . Hypertension Mother   . Hyperlipidemia Mother     ROS: Otherwise negative unless mentioned in HPI  Physical Examination  Vitals:   03/09/21 1500 03/09/21 1606  BP: (!) 150/74 (!) 158/83  Pulse: 62 (!) 56  Resp: (!) 9 12  Temp:  97.7 F (36.5 C)  SpO2: 98% 98%   Body mass index is 29.15 kg/m.  General:  WDWN in NAD Gait: Not observed HENT: WNL, normocephalic Pulmonary: normal non-labored breathing, without Rales, rhonchi,  wheezing Cardiac: regular, without  Murmurs, rubs or gallops;  with carotid bruits Abdomen:  soft, NT/ND, no masses Skin: without rashes Vascular Exam/Pulses: 2+ bilateral radial, brachial, femoral and DP pulses Extremities: without ischemic changes, without Gangrene , without cellulitis; without open wounds;  Musculoskeletal: no muscle wasting or atrophy. 5/5 bilateral grip strength, biceps strength and plantar flexion  Neurologic: A&O X 3;  No focal weakness or paresthesias are detected; speech is fluent/normal. Tongue is midline. Face is symmetrical. Psychiatric:  The pt has Normal affect. Lymph:  Unremarkable  CBC    Component Value Date/Time   WBC 12.4 (H) 03/09/2021 1415   RBC 4.64 03/09/2021 1415   HGB 15.0 03/09/2021 1427   HGB 14.2 10/29/2019 0918   HCT 44.0 03/09/2021 1427   HCT 41.1 10/29/2019 0918   PLT 204 03/09/2021 1415   PLT 168 10/29/2019 0918   MCV 93.5 03/09/2021 1415   MCV 89 10/29/2019 0918   MCH 31.0 03/09/2021 1415   MCHC 33.2 03/09/2021 1415   RDW 14.6 03/09/2021 1415   RDW 12.3 10/29/2019 0918   LYMPHSABS 2.8 03/09/2021 1415   MONOABS 1.0 03/09/2021 1415   EOSABS 0.5 03/09/2021 1415   BASOSABS 0.1 03/09/2021 1415    BMET    Component Value Date/Time   NA 134 (L) 03/09/2021 1427   NA 140 03/31/2020 1244   K 4.4 03/09/2021 1427   CL 104 03/09/2021 1427   CO2 21 (L) 03/09/2021 1415   GLUCOSE  102 (H) 03/09/2021 1427   BUN 40 (H) 03/09/2021 1427   BUN 24 03/31/2020 1244   CREATININE 3.00 (H) 03/09/2021 1427   CALCIUM 8.9 03/09/2021 1415   GFRNONAA 22 (L) 03/09/2021 1415   GFRAA 33 (L) 03/31/2020 1244    COAGS: Lab Results  Component Value Date   INR 1.2 03/09/2021   INR 1.01 05/31/2012   INR 0.90 09/09/2010   MRI neck: IMPRESSION: 1. Left carotid stenting across the bifurcation. The stent is patent however there is filling defect within the distal stent which may be due to acute thrombus or atherosclerotic plaque. This appears to be causing significant stenosis. 2. Small left middle cerebral artery compared to the right likely due to hypoperfusion. No intracranial large vessel occlusion 3. Atherosclerotic aorta. Mild atherosclerotic disease right carotid bifurcation. 4. Moderate stenosis left vertebral artery which has origin from the arch. 5. These results were called by telephone at the time of interpretation on 03/09/2021 at 2:49 pm to provider [facility] , who verbally acknowledged these results.   Electronically Signed   By: Franchot Gallo M.D.   On: 03/09/2021 14:51   Statin:  Yes.   Beta Blocker:  Yes.   Aspirin:  Yes.   ACEI:  No. ARB:  Yes.   CCB use:  Yes Other antiplatelets/anticoagulants:  Yes.   apixiban   ASSESSMENT/PLAN: This is a 75 y.o. male who presents with two episodes of left amaurosis fugax in the last 24 hours. CTA neck reveals filling defect within the left ICA stent. He is being placed on heparin infusion. Dr. Donnetta Hutching, vascular surgeon on call, has evaluated the patient and provided the patient and his wife surgical options.   -Barbie Banner PA-C [facility] [phone] 03/09/2021  4:39 PM     I have examined the patient, reviewed and agree with above.  Very complex patient.  History and physical outlined above.  Had initial left carotid endarterectomy in 2007.  In 2011 was found to high-grade restenosis  at the proximal patch and underwent transfemoral approach for  carotid stenting.  Had recurrent asymptomatic stenosis and underwent restenting in 2020.  Presented with several distinct episodes of amaurosis fugax.  CT angiogram revealed subtotal occlusion of the left carotid stent with what appears to be thrombus.  The MRI of the brain showed a small acute/subacute infarct within the left cerebellar hemisphere of questionable relationship.  I discussed his case with Dr.Arora.  Feel that his symptoms almost undoubtedly related to the lesion in his carotid.  Concern regarding the crescendo events.  Recommend surgery tomorrow.  He is on Eliquis which is being held.  Will be on heparin drip tonight which will be held at time of surgery.  Discussed options with the patient and his wife present.  Most likely will proceed with resection of his carotid artery and interposition with saphenous vein.  Fortunately he does have a low carotid bifurcation with normal internal carotid which is accessible at the angle of his jaw.  Reviewed the case with Dr. Carlis Abbott as well.  The patient understands that one of our our surgeons will be doing the surgery tomorrow depending on OR availability.  Did discuss risk to include the risk of stroke.  I explained that his risk would be higher than standard 1 to 2% with elective endarterectomy but feel that his risk should be less than 5%.  Curt Jews, MD 03/09/2021 6:28 PM

## 2021-03-10 ENCOUNTER — Encounter (HOSPITAL_COMMUNITY)
Admission: EM | Disposition: A | Discharge status: Home / Self Care | Payer: Self-pay | Source: Home / Self Care | Attending: Internal Medicine

## 2021-03-10 ENCOUNTER — Inpatient Hospital Stay (HOSPITAL_COMMUNITY): Payer: Medicare Other | Admitting: Anesthesiology

## 2021-03-10 ENCOUNTER — Encounter (HOSPITAL_COMMUNITY): Payer: Self-pay | Admitting: Internal Medicine

## 2021-03-10 ENCOUNTER — Inpatient Hospital Stay (HOSPITAL_COMMUNITY): Payer: Medicare Other

## 2021-03-10 DIAGNOSIS — N184 Chronic kidney disease, stage 4 (severe): Secondary | ICD-10-CM

## 2021-03-10 DIAGNOSIS — I6389 Other cerebral infarction: Secondary | ICD-10-CM | POA: Diagnosis not present

## 2021-03-10 DIAGNOSIS — I10 Essential (primary) hypertension: Secondary | ICD-10-CM

## 2021-03-10 DIAGNOSIS — I5030 Unspecified diastolic (congestive) heart failure: Secondary | ICD-10-CM

## 2021-03-10 DIAGNOSIS — I129 Hypertensive chronic kidney disease with stage 1 through stage 4 chronic kidney disease, or unspecified chronic kidney disease: Secondary | ICD-10-CM

## 2021-03-10 DIAGNOSIS — N1832 Chronic kidney disease, stage 3b: Secondary | ICD-10-CM

## 2021-03-10 DIAGNOSIS — I6522 Occlusion and stenosis of left carotid artery: Secondary | ICD-10-CM

## 2021-03-10 DIAGNOSIS — G453 Amaurosis fugax: Secondary | ICD-10-CM

## 2021-03-10 DIAGNOSIS — I4819 Other persistent atrial fibrillation: Secondary | ICD-10-CM

## 2021-03-10 DIAGNOSIS — Z7901 Long term (current) use of anticoagulants: Secondary | ICD-10-CM

## 2021-03-10 HISTORY — PX: VEIN HARVEST: SHX6363

## 2021-03-10 HISTORY — PX: LIGATION OF ARTERIOVENOUS  FISTULA: SHX5948

## 2021-03-10 HISTORY — PX: ENDARTERECTOMY: SHX5162

## 2021-03-10 LAB — CBC
HCT: 38.6 % — ABNORMAL LOW (ref 39.0–52.0)
Hemoglobin: 13.1 g/dL (ref 13.0–17.0)
MCH: 31.2 pg (ref 26.0–34.0)
MCHC: 33.9 g/dL (ref 30.0–36.0)
MCV: 91.9 fL (ref 80.0–100.0)
Platelets: 202 10*3/uL (ref 150–400)
RBC: 4.2 MIL/uL — ABNORMAL LOW (ref 4.22–5.81)
RDW: 14.6 % (ref 11.5–15.5)
WBC: 10 10*3/uL (ref 4.0–10.5)
nRBC: 0 % (ref 0.0–0.2)

## 2021-03-10 LAB — ECHOCARDIOGRAM COMPLETE
Area-P 1/2: 2.24 cm2
Height: 72 in
S' Lateral: 3.3 cm
Weight: 3633.18 oz

## 2021-03-10 LAB — LIPID PANEL
Cholesterol: 145 mg/dL (ref 0–200)
HDL: 33 mg/dL — ABNORMAL LOW (ref >40)
LDL Cholesterol: 87 mg/dL (ref 0–99)
Total CHOL/HDL Ratio: 4.4 RATIO
Triglycerides: 126 mg/dL (ref <150)
VLDL: 25 mg/dL (ref 0–40)

## 2021-03-10 LAB — BASIC METABOLIC PANEL
Anion gap: 8 (ref 5–15)
BUN: 32 mg/dL — ABNORMAL HIGH (ref 8–23)
CO2: 22 mmol/L (ref 22–32)
Calcium: 8.9 mg/dL (ref 8.9–10.3)
Chloride: 104 mmol/L (ref 98–111)
Creatinine, Ser: 2.76 mg/dL — ABNORMAL HIGH (ref 0.61–1.24)
GFR, Estimated: 23 mL/min — ABNORMAL LOW (ref >60)
Glucose, Bld: 90 mg/dL (ref 70–99)
Potassium: 4 mmol/L (ref 3.5–5.1)
Sodium: 134 mmol/L — ABNORMAL LOW (ref 135–145)

## 2021-03-10 LAB — HEMOGLOBIN A1C
Hgb A1c MFr Bld: 6.1 % — ABNORMAL HIGH (ref 4.8–5.6)
Mean Plasma Glucose: 128.37 mg/dL

## 2021-03-10 LAB — HEPARIN LEVEL (UNFRACTIONATED): Heparin Unfractionated: 1.8 IU/mL — ABNORMAL HIGH (ref 0.30–0.70)

## 2021-03-10 LAB — MAGNESIUM: Magnesium: 2.2 mg/dL (ref 1.7–2.4)

## 2021-03-10 LAB — POCT ACTIVATED CLOTTING TIME: Activated Clotting Time: 303 seconds

## 2021-03-10 LAB — APTT
aPTT: 131 seconds — ABNORMAL HIGH (ref 24–36)
aPTT: 74 seconds — ABNORMAL HIGH (ref 24–36)

## 2021-03-10 SURGERY — ENDARTERECTOMY, CAROTID
Anesthesia: General | Site: Neck | Laterality: Left

## 2021-03-10 MED ORDER — SODIUM CHLORIDE 0.9 % IV SOLN: 500.0000 mL | Freq: Once | INTRAVENOUS | Status: DC | PRN

## 2021-03-10 MED ORDER — ONDANSETRON HCL 4 MG/2ML IJ SOLN: 4.0000 mg | Freq: Four times a day (QID) | INTRAMUSCULAR | Status: DC | PRN

## 2021-03-10 MED ORDER — PROPOFOL 10 MG/ML IV BOLUS
INTRAVENOUS | Status: AC
  Filled 2021-03-10: qty 20

## 2021-03-10 MED ORDER — LIDOCAINE HCL (PF) 1 % IJ SOLN
INTRAMUSCULAR | Status: DC | PRN
  Administered 2021-03-10: 30 mL

## 2021-03-10 MED ORDER — FENTANYL CITRATE (PF) 100 MCG/2ML IJ SOLN
INTRAMUSCULAR | Status: DC | PRN
  Administered 2021-03-10: 50 ug via INTRAVENOUS

## 2021-03-10 MED ORDER — HEMOSTATIC AGENTS (NO CHARGE) OPTIME
TOPICAL | Status: DC | PRN
  Administered 2021-03-10 (×2): 1 via TOPICAL

## 2021-03-10 MED ORDER — SODIUM CHLORIDE 0.9 % IV SOLN: INTRAVENOUS | Status: AC

## 2021-03-10 MED ORDER — SODIUM CHLORIDE 0.9 % IV SOLN: INTRAVENOUS | Status: DC | PRN

## 2021-03-10 MED ORDER — PHENYLEPHRINE HCL-NACL 10-0.9 MG/250ML-% IV SOLN
INTRAVENOUS | Status: DC | PRN
  Administered 2021-03-10: 25 ug/min via INTRAVENOUS

## 2021-03-10 MED ORDER — PROPOFOL 10 MG/ML IV BOLUS
INTRAVENOUS | Status: DC | PRN
  Administered 2021-03-10: 20 mg via INTRAVENOUS
  Administered 2021-03-10: 160 mg via INTRAVENOUS

## 2021-03-10 MED ORDER — CARVEDILOL 12.5 MG PO TABS
12.5000 mg | ORAL_TABLET | Freq: Two times a day (BID) | ORAL | Status: DC
  Administered 2021-03-10 – 2021-03-12 (×4): 12.5 mg via ORAL
  Filled 2021-03-10 (×4): qty 1

## 2021-03-10 MED ORDER — 0.9 % SODIUM CHLORIDE (POUR BTL) OPTIME
TOPICAL | Status: DC | PRN
  Administered 2021-03-10: 2000 mL

## 2021-03-10 MED ORDER — PROTAMINE SULFATE 10 MG/ML IV SOLN
INTRAVENOUS | Status: DC | PRN
  Administered 2021-03-10 (×2): 10 mg via INTRAVENOUS
  Administered 2021-03-10: 20 mg via INTRAVENOUS
  Administered 2021-03-10: 10 mg via INTRAVENOUS

## 2021-03-10 MED ORDER — HYDROMORPHONE HCL 1 MG/ML IJ SOLN
0.2500 mg | INTRAMUSCULAR | Status: DC | PRN
  Administered 2021-03-10: 0.5 mg via INTRAVENOUS

## 2021-03-10 MED ORDER — DEXMEDETOMIDINE (PRECEDEX) IN NS 20 MCG/5ML (4 MCG/ML) IV SYRINGE
PREFILLED_SYRINGE | INTRAVENOUS | Status: DC | PRN
Start: 2021-03-10 — End: 2021-03-10
  Administered 2021-03-10: 4 ug via INTRAVENOUS
  Administered 2021-03-10: 8 ug via INTRAVENOUS
  Administered 2021-03-10 (×2): 4 ug via INTRAVENOUS

## 2021-03-10 MED ORDER — EPHEDRINE SULFATE 50 MG/ML IJ SOLN
INTRAMUSCULAR | Status: DC | PRN
  Administered 2021-03-10: 5 mg via INTRAVENOUS

## 2021-03-10 MED ORDER — HYDRALAZINE HCL 20 MG/ML IJ SOLN
5.0000 mg | INTRAMUSCULAR | Status: DC | PRN
  Administered 2021-03-11: 5 mg via INTRAVENOUS
  Filled 2021-03-10: qty 1

## 2021-03-10 MED ORDER — FENTANYL CITRATE (PF) 250 MCG/5ML IJ SOLN
INTRAMUSCULAR | Status: AC
  Filled 2021-03-10: qty 5

## 2021-03-10 MED ORDER — PHENOL 1.4 % MT LIQD
1.0000 | OROMUCOSAL | Status: DC | PRN
  Administered 2021-03-11: 1 via OROMUCOSAL
  Filled 2021-03-10: qty 177

## 2021-03-10 MED ORDER — LOSARTAN POTASSIUM-HCTZ 50-12.5 MG PO TABS: 1.0000 | ORAL_TABLET | Freq: Every day | ORAL | Status: DC

## 2021-03-10 MED ORDER — MORPHINE SULFATE (PF) 2 MG/ML IV SOLN
2.0000 mg | INTRAVENOUS | Status: DC | PRN
  Administered 2021-03-10 – 2021-03-11 (×3): 2 mg via INTRAVENOUS
  Filled 2021-03-10 (×3): qty 1

## 2021-03-10 MED ORDER — LABETALOL HCL 5 MG/ML IV SOLN
10.0000 mg | INTRAVENOUS | Status: DC | PRN
  Administered 2021-03-11: 10 mg via INTRAVENOUS
  Filled 2021-03-10: qty 4

## 2021-03-10 MED ORDER — ONDANSETRON HCL 4 MG/2ML IJ SOLN
INTRAMUSCULAR | Status: DC | PRN
  Administered 2021-03-10: 4 mg via INTRAVENOUS

## 2021-03-10 MED ORDER — CEFAZOLIN SODIUM-DEXTROSE 2-4 GM/100ML-% IV SOLN
2.0000 g | Freq: Three times a day (TID) | INTRAVENOUS | Status: AC
  Administered 2021-03-10 – 2021-03-11 (×2): 2 g via INTRAVENOUS
  Filled 2021-03-10 (×2): qty 100

## 2021-03-10 MED ORDER — GUAIFENESIN-DM 100-10 MG/5ML PO SYRP: 15.0000 mL | ORAL_SOLUTION | ORAL | Status: DC | PRN

## 2021-03-10 MED ORDER — HEPARIN SODIUM (PORCINE) 1000 UNIT/ML IJ SOLN
INTRAMUSCULAR | Status: DC | PRN
  Administered 2021-03-10: 10000 [IU] via INTRAVENOUS

## 2021-03-10 MED ORDER — ASPIRIN EC 81 MG PO TBEC
81.0000 mg | DELAYED_RELEASE_TABLET | Freq: Every day | ORAL | Status: DC
  Administered 2021-03-10 – 2021-03-11 (×2): 81 mg via ORAL
  Filled 2021-03-10 (×2): qty 1

## 2021-03-10 MED ORDER — DEXAMETHASONE SODIUM PHOSPHATE 4 MG/ML IJ SOLN
INTRAMUSCULAR | Status: DC | PRN
  Administered 2021-03-10: 4 mg via INTRAVENOUS

## 2021-03-10 MED ORDER — AMLODIPINE BESYLATE 10 MG PO TABS
10.0000 mg | ORAL_TABLET | Freq: Every day | ORAL | Status: DC
  Administered 2021-03-11 – 2021-03-12 (×2): 10 mg via ORAL
  Filled 2021-03-10 (×2): qty 1

## 2021-03-10 MED ORDER — LIDOCAINE HCL (CARDIAC) PF 100 MG/5ML IV SOSY
PREFILLED_SYRINGE | INTRAVENOUS | Status: DC | PRN
  Administered 2021-03-10: 40 mg via INTRAVENOUS

## 2021-03-10 MED ORDER — LABETALOL HCL 5 MG/ML IV SOLN
INTRAVENOUS | Status: DC | PRN
  Administered 2021-03-10: 5 mg via INTRAVENOUS

## 2021-03-10 MED ORDER — GLYCOPYRROLATE 0.2 MG/ML IJ SOLN
INTRAMUSCULAR | Status: DC | PRN
  Administered 2021-03-10: .2 mg via INTRAVENOUS

## 2021-03-10 MED ORDER — HYDROCHLOROTHIAZIDE 12.5 MG PO CAPS
12.5000 mg | ORAL_CAPSULE | Freq: Every day | ORAL | Status: DC
  Administered 2021-03-11 – 2021-03-12 (×2): 12.5 mg via ORAL
  Filled 2021-03-10 (×2): qty 1

## 2021-03-10 MED ORDER — ONDANSETRON HCL 4 MG/2ML IJ SOLN: 4.0000 mg | Freq: Once | INTRAMUSCULAR | Status: DC | PRN

## 2021-03-10 MED ORDER — LOSARTAN POTASSIUM 50 MG PO TABS
50.0000 mg | ORAL_TABLET | Freq: Every day | ORAL | Status: DC
  Administered 2021-03-11 – 2021-03-12 (×2): 50 mg via ORAL
  Filled 2021-03-10 (×2): qty 1

## 2021-03-10 MED ORDER — ROCURONIUM BROMIDE 100 MG/10ML IV SOLN
INTRAVENOUS | Status: DC | PRN
  Administered 2021-03-10: 30 mg via INTRAVENOUS
  Administered 2021-03-10: 70 mg via INTRAVENOUS
  Administered 2021-03-10: 30 mg via INTRAVENOUS

## 2021-03-10 MED ORDER — HYDROMORPHONE HCL 1 MG/ML IJ SOLN
INTRAMUSCULAR | Status: AC
  Filled 2021-03-10: qty 1

## 2021-03-10 MED ORDER — LIDOCAINE HCL (PF) 1 % IJ SOLN
INTRAMUSCULAR | Status: AC
  Filled 2021-03-10: qty 30

## 2021-03-10 MED ORDER — HYDROMORPHONE HCL 1 MG/ML IJ SOLN
INTRAMUSCULAR | Status: DC | PRN
  Administered 2021-03-10: .5 mg via INTRAVENOUS

## 2021-03-10 MED ORDER — PANTOPRAZOLE SODIUM 40 MG PO TBEC
40.0000 mg | DELAYED_RELEASE_TABLET | Freq: Every day | ORAL | Status: DC
  Administered 2021-03-11 – 2021-03-12 (×2): 40 mg via ORAL
  Filled 2021-03-10 (×2): qty 1

## 2021-03-10 MED ORDER — SUGAMMADEX SODIUM 200 MG/2ML IV SOLN
INTRAVENOUS | Status: DC | PRN
  Administered 2021-03-10: 225 mg via INTRAVENOUS

## 2021-03-10 MED ORDER — OXYCODONE-ACETAMINOPHEN 5-325 MG PO TABS
1.0000 | ORAL_TABLET | ORAL | Status: DC | PRN
  Administered 2021-03-11 – 2021-03-12 (×8): 2 via ORAL
  Filled 2021-03-10 (×8): qty 2

## 2021-03-10 MED ORDER — CEFAZOLIN SODIUM-DEXTROSE 2-3 GM-%(50ML) IV SOLR
INTRAVENOUS | Status: DC | PRN
  Administered 2021-03-10: 2 g via INTRAVENOUS

## 2021-03-10 MED ORDER — SODIUM CHLORIDE 0.9 % IV SOLN
INTRAVENOUS | Status: AC
  Filled 2021-03-10: qty 1.2

## 2021-03-10 MED ORDER — METOPROLOL TARTRATE 5 MG/5ML IV SOLN: 2.0000 mg | INTRAVENOUS | Status: DC | PRN

## 2021-03-10 MED ORDER — REMIFENTANIL HCL 1 MG IV SOLR
0.0500 ug/kg/min | INTRAVENOUS | Status: DC
  Administered 2021-03-10: .1 ug/kg/min via INTRAVENOUS
  Filled 2021-03-10 (×4): qty 5000

## 2021-03-10 MED ORDER — HYDROMORPHONE HCL 1 MG/ML IJ SOLN
INTRAMUSCULAR | Status: AC
  Filled 2021-03-10: qty 0.5

## 2021-03-10 SURGICAL SUPPLY — 59 items
ADH SKN CLS APL DERMABOND .7 (GAUZE/BANDAGES/DRESSINGS) ×6
ADPR TBG 2 MALE LL ART (MISCELLANEOUS)
CANISTER SUCT 3000ML PPV (MISCELLANEOUS) ×4 IMPLANT
CANNULA VESSEL 3MM 2 BLNT TIP (CANNULA) ×2 IMPLANT
CATH ROBINSON RED A/P 18FR (CATHETERS) ×4 IMPLANT
CLIP VESOCCLUDE MED 24/CT (CLIP) ×4 IMPLANT
CLIP VESOCCLUDE SM WIDE 24/CT (CLIP) ×4 IMPLANT
COVER PROBE W GEL 5X96 (DRAPES) IMPLANT
COVER TRANSDUCER ULTRASND GEL (DISPOSABLE) ×3 IMPLANT
COVER WAND RF STERILE (DRAPES) ×3 IMPLANT
DERMABOND ADVANCED (GAUZE/BANDAGES/DRESSINGS) ×2
DERMABOND ADVANCED .7 DNX12 (GAUZE/BANDAGES/DRESSINGS) ×3 IMPLANT
DRAIN CHANNEL 15F RND FF W/TCR (WOUND CARE) IMPLANT
ELECT CAUTERY BLADE 6.4 (BLADE) ×1 IMPLANT
ELECT REM PT RETURN 9FT ADLT (ELECTROSURGICAL) ×8 IMPLANT
ELECTRODE REM PT RTRN 9FT ADLT (ELECTROSURGICAL) ×3 IMPLANT
EVACUATOR SILICONE 100CC (DRAIN) IMPLANT
GAUZE 4X4 16PLY RFD (DISPOSABLE) ×1 IMPLANT
GLOVE BIO SURGEON STRL SZ7.5 (GLOVE) ×5 IMPLANT
GLOVE SRG 8 PF TXTR STRL LF DI (GLOVE) ×3 IMPLANT
GLOVE SURG UNDER POLY LF SZ8 (GLOVE) ×4
GOWN STRL REUS W/ TWL LRG LVL3 (GOWN DISPOSABLE) ×6 IMPLANT
GOWN STRL REUS W/ TWL XL LVL3 (GOWN DISPOSABLE) ×6 IMPLANT
GOWN STRL REUS W/TWL LRG LVL3 (GOWN DISPOSABLE) ×12
GOWN STRL REUS W/TWL XL LVL3 (GOWN DISPOSABLE) ×8
HEMOSTAT SNOW SURGICEL 2X4 (HEMOSTASIS) ×2 IMPLANT
IV ADAPTER SYR DOUBLE MALE LL (MISCELLANEOUS) IMPLANT
KIT BASIN OR (CUSTOM PROCEDURE TRAY) ×4 IMPLANT
KIT SHUNT ARGYLE CAROTID ART 6 (VASCULAR PRODUCTS) IMPLANT
KIT TURNOVER KIT B (KITS) ×4 IMPLANT
LOOP VESSEL MINI RED (MISCELLANEOUS) IMPLANT
NDL HYPO 25GX1X1/2 BEV (NEEDLE) IMPLANT
NDL SPNL 20GX3.5 QUINCKE YW (NEEDLE) IMPLANT
NEEDLE HYPO 25GX1X1/2 BEV (NEEDLE) IMPLANT
NEEDLE SPNL 20GX3.5 QUINCKE YW (NEEDLE) IMPLANT
NS IRRIG 1000ML POUR BTL (IV SOLUTION) ×11 IMPLANT
PACK CAROTID (CUSTOM PROCEDURE TRAY) ×4 IMPLANT
PAD ARMBOARD 7.5X6 YLW CONV (MISCELLANEOUS) ×8 IMPLANT
PENCIL BUTTON HOLSTER BLD 10FT (ELECTRODE) ×1 IMPLANT
POSITIONER HEAD DONUT 9IN (MISCELLANEOUS) ×4 IMPLANT
SHUNT CAROTID BYPASS 10 (VASCULAR PRODUCTS) IMPLANT
SHUNT CAROTID BYPASS 12FRX15.5 (VASCULAR PRODUCTS) IMPLANT
SPONGE SURGIFOAM ABS GEL 100 (HEMOSTASIS) IMPLANT
STOPCOCK 4 WAY LG BORE MALE ST (IV SETS) IMPLANT
SUT ETHILON 3 0 PS 1 (SUTURE) IMPLANT
SUT MNCRL AB 4-0 PS2 18 (SUTURE) ×5 IMPLANT
SUT PROLENE 5 0 C 1 24 (SUTURE) ×9 IMPLANT
SUT PROLENE 6 0 BV (SUTURE) ×7 IMPLANT
SUT PROLENE 6 0 CC (SUTURE) ×1 IMPLANT
SUT SILK 3 0 (SUTURE)
SUT SILK 3-0 18XBRD TIE 12 (SUTURE) IMPLANT
SUT VIC AB 2-0 CT1 27 (SUTURE) ×8
SUT VIC AB 2-0 CT1 TAPERPNT 27 (SUTURE) IMPLANT
SUT VIC AB 3-0 SH 27 (SUTURE) ×16
SUT VIC AB 3-0 SH 27X BRD (SUTURE) ×3 IMPLANT
SYR CONTROL 10ML LL (SYRINGE) ×1 IMPLANT
TOWEL GREEN STERILE (TOWEL DISPOSABLE) ×6 IMPLANT
TUBING ART PRESS 48 MALE/FEM (TUBING) IMPLANT
WATER STERILE IRR 1000ML POUR (IV SOLUTION) ×4 IMPLANT

## 2021-03-10 NOTE — H&P (Signed)
History and Physical Interval Note:  03/10/2021 3:39 PM  Melina Copa Doucet  has presented today for surgery, with the diagnosis of Carotid Stenosis.  The various methods of treatment have been discussed with the patient and family. After consideration of risks, benefits and other options for treatment, the patient has consented to  Procedure(s): REDO LEFT CAROTID ENDARTERECTOMY WITH INNER POSITION GRAFT (Left) as a surgical intervention.  The patient's history has been reviewed, patient examined, no change in status, stable for surgery.  I have reviewed the patient's chart and labs.  Questions were answered to the patient's satisfaction.    Complex problem given likely TIA events from recurrent left carotid stent stenosis.  He has had a previous carotid endarterectomy and then 2 additional transfemoral left carotid stents.  CTA shows likely subtotal occlusion.  I discussed plans for redo neck surgery and resecting his carotid stents and performing a carotid bypass with interposition graft.  Likely higher stroke risk as previously noted by Dr. Donnetta Hutching.  Other risk and benefits discussed.  [place]    Reason for Consult:  Carotid stenosis/TIA Requesting Physician:  Dr. Rory Percy MRN #:  [id number]  History of Present Illness: This is a 75 y.o. male was being seen today at Dr. Jason Nest office with history of CKD when he reported loss of sight in left eye for 4 minutes. He proceeded to the ED where he is interviewed and examined. His wife is at bedside. He states he lost approximately one-third of his left visual field yesterday morning lasting for 2 minutes. He denies pain, slurred speech confusion, extremity numbness or weakness. He denies CP or headache. He is currently at his neurologic baseline.  He underwent left CEA in 2007 for asymptomatic left ICS stenosis by Dr. Oneida Alar.  He then underwent left TCAR due to left ICA restenosis in 2011 and again in 2020.  History of  CKD, atrial fibrillation on Eliquis Smokes approximately one PPD.      Past Medical History:  Diagnosis Date  . CKD (chronic kidney disease), stage III ([place])   . Dyspnea on exertion   . Edema   . HTN (hypertension)   . Hyperlipidemia   . Left carotid stenosis    a. 2007 s/p L CEA;  b. 2011 s/p L common carotid stenting 2/2 restenosis;  c. Q000111Q u/s: RICA [id number], LICA [id number], patent LCCA stent.  [name] PAD (peripheral artery disease) (HCC)    a. s/p prior RSFA, REIA, distal LSFA (known to be occluded), LEIA, and LCIA stenting;  b. 01/2015 ABI's: R 0.78, L 0.53.  [name] Stroke [facility])          Past Surgical History:  Procedure Laterality Date  . ABDOMINAL AORTAGRAM N/A 02/05/2015   Procedure: ABDOMINAL Maxcine Ham;  Surgeon: Conrad Polk, MD;  Location: [facility] CATH LAB;  Service: Cardiovascular;  Laterality: N/A;  . AORTIC ARCH ANGIOGRAPHY N/A 07/19/2019   Procedure: AORTIC ARCH ANGIOGRAPHY;  Surgeon: Elam Dutch, MD;  Location: [place] CV LAB;  Service: Cardiovascular;  Laterality: N/A;  . CARDIOVERSION N/A 11/06/2019   Procedure: CARDIOVERSION;  Surgeon: Pixie Casino, MD;  Location: [facility] ENDOSCOPY;  Service: Cardiovascular;  Laterality: N/A;  . CAROTID ANGIOGRAPHY N/A 07/19/2019   Procedure: CAROTID ANGIOGRAPHY;  Surgeon: Elam Dutch, MD;  Location: [place] CV LAB;  Service: Cardiovascular;  Laterality: N/A;  . carotid endarterecotomy    . CAROTID PTA/STENT INTERVENTION Left 08/02/2019   Procedure: CAROTID PTA/STENT INTERVENTION;  Surgeon: Ruta Hinds  E, MD;  Location: [place] CV LAB;  Service: Cardiovascular;  Laterality: Left;  . external iliac     status post bilateral and left common iliac artery  . lumbar back surgery     x2  . LUMBAR DISC SURGERY     x 2  . stnt     lower extermity  . TENDON REPAIR     to the left arm  . TONSILLECTOMY          Allergies  Allergen Reactions  . Plavix [Clopidogrel] Rash            Prior to Admission medications   Medication Sig Start Date End Date Taking? Authorizing Provider  albuterol (VENTOLIN HFA) 108 (90 Base) MCG/ACT inhaler Inhale 2 puffs into the lungs every 6 (six) hours as needed (wheezing/shortness of breath).  10/05/19  Yes [provider]  allopurinol (ZYLOPRIM) 300 MG tablet Take 300 mg by mouth daily. 01/26/21  Yes [provider]  amLODipine (NORVASC) 10 MG tablet Take 10 mg by mouth daily. 01/17/20  Yes [provider]  aspirin EC 81 MG tablet Take 1 tablet (81 mg total) by mouth daily. Patient taking differently: Take 81 mg by mouth at bedtime. 08/22/19  Yes Sherren Mocha, MD  azelastine (ASTELIN) 0.1 % nasal spray Place 1-2 sprays into both nostrils 2 (two) times daily as needed for rhinitis. Use in each nostril as directed   Yes [provider]  carvedilol (COREG) 12.5 MG tablet Take 1 tablet (12.5 mg total) by mouth 2 (two) times daily. 11/07/19  Yes Weaver, Scott T, PA-C  ELIQUIS 5 MG TABS tablet TAKE 1 TABLET(5 MG) BY MOUTH TWICE DAILY Patient taking differently: Take 5 mg by mouth 2 (two) times daily. 08/03/20  Yes Sherren Mocha, MD  furosemide (LASIX) 40 MG tablet Take 40 mg by mouth as needed for fluid (swelling). 10/06/20  Yes [provider]  gabapentin (NEURONTIN) 300 MG capsule Take 300 mg by mouth 2 (two) times daily. 02/26/21  Yes [provider]  losartan-hydrochlorothiazide (HYZAAR) 50-12.5 MG tablet Take 1 tablet by mouth daily. 01/01/20  Yes [provider]  omeprazole (PRILOSEC) 20 MG capsule Take 20 mg by mouth daily with breakfast.  11/30/14  Yes [provider]  oxyCODONE-acetaminophen (PERCOCET) 7.5-325 MG tablet Take 0.5 tablets by mouth every 5 (five) hours as needed for severe pain.    Yes [provider]  potassium chloride (KLOR-CON) 10 MEQ tablet TAKE 1 TABLET(10 MEQ) BY MOUTH DAILY Patient taking differently: Take 10 mEq by mouth daily.  11/03/20  Yes Sherren Mocha, MD  rosuvastatin (CRESTOR) 10 MG tablet Take 10 mg by mouth every evening.    Yes [provider]  TRELEGY ELLIPTA 100-62.5-25 MCG/INH AEPB Inhale 1 puff into the lungs at bedtime.  08/17/19  Yes [provider]    Social History        Socioeconomic History  . Marital status: Married    Spouse name: Not on file  . Number of children: Not on file  . Years of education: Not on file  . Highest education level: Not on file  Occupational History  . Not on file  Tobacco Use  . Smoking status: Current Every Day Smoker    Packs/day: 1.00    Years: 50.00    Pack years: 50.00    Types: Cigarettes  . Smokeless tobacco: Never Used  Vaping Use  . Vaping Use: Never used  Substance and Sexual Activity  .  Alcohol use: Yes    Alcohol/week: 7.0 standard drinks    Types: 7 Glasses of wine per week  . Drug use: No  . Sexual activity: Not on file  Other Topics Concern  . Not on file  Social History Narrative  . Not on file   Social Determinants of Health   Financial Resource Strain: Not on file  Food Insecurity: Not on file  Transportation Needs: Not on file  Physical Activity: Not on file  Stress: Not on file  Social Connections: Not on file  Intimate Partner Violence: Not on file          Family History  Problem Relation Age of Onset  . Hypertension Mother   . Hyperlipidemia Mother     ROS: Otherwise negative unless mentioned in HPI  Physical Examination      Vitals:   03/09/21 1500 03/09/21 1606  BP: (!) 150/74 (!) 158/83  Pulse: 62 (!) 56  Resp: (!) 9 12  Temp:  97.7 F (36.5 C)  SpO2: 98% 98%   Body mass index is 29.15 kg/m.  General:  WDWN in NAD Gait: Not observed HENT: WNL, normocephalic Pulmonary: normal non-labored breathing, without Rales, rhonchi,  wheezing Cardiac: regular, without  Murmurs, rubs or gallops; with carotid bruits Abdomen:  soft, NT/ND, no  masses Skin: without rashes Vascular Exam/Pulses: 2+ bilateral radial, brachial, femoral and DP pulses Extremities: without ischemic changes, without Gangrene , without cellulitis; without open wounds;  Musculoskeletal: no muscle wasting or atrophy. 5/5 bilateral grip strength, biceps strength and plantar flexion    Neurologic: A&O X 3;  No focal weakness or paresthesias are detected; speech is fluent/normal. Tongue is midline. Face is symmetrical. Psychiatric:  The pt has Normal affect. Lymph:  Unremarkable  CBC Labs (Brief)          Component Value Date/Time   WBC 12.4 (H) 03/09/2021 1415   RBC 4.64 03/09/2021 1415   HGB 15.0 03/09/2021 1427   HGB 14.2 10/29/2019 0918   HCT 44.0 03/09/2021 1427   HCT 41.1 10/29/2019 0918   PLT 204 03/09/2021 1415   PLT 168 10/29/2019 0918   MCV 93.5 03/09/2021 1415   MCV 89 10/29/2019 0918   MCH 31.0 03/09/2021 1415   MCHC 33.2 03/09/2021 1415   RDW 14.6 03/09/2021 1415   RDW 12.3 10/29/2019 0918   LYMPHSABS 2.8 03/09/2021 1415   MONOABS 1.0 03/09/2021 1415   EOSABS 0.5 03/09/2021 1415   BASOSABS 0.1 03/09/2021 1415      BMET Labs (Brief)          Component Value Date/Time   NA 134 (L) 03/09/2021 1427   NA 140 03/31/2020 1244   K 4.4 03/09/2021 1427   CL 104 03/09/2021 1427   CO2 21 (L) 03/09/2021 1415   GLUCOSE 102 (H) 03/09/2021 1427   BUN 40 (H) 03/09/2021 1427   BUN 24 03/31/2020 1244   CREATININE 3.00 (H) 03/09/2021 1427   CALCIUM 8.9 03/09/2021 1415   GFRNONAA 22 (L) 03/09/2021 1415   GFRAA 33 (L) 03/31/2020 1244      COAGS: Recent Labs       Lab Results  Component Value Date   INR 1.2 03/09/2021   INR 1.01 05/31/2012   INR 0.90 09/09/2010     MRI neck: IMPRESSION: 1. Left carotid stenting across the bifurcation. The stent is patent however there is filling defect within the distal stent which may be due to acute thrombus or atherosclerotic plaque. This appears to  be causing significant stenosis. 2. Small left middle cerebral artery compared to the right likely due to hypoperfusion. No intracranial large vessel occlusion 3. Atherosclerotic aorta. Mild atherosclerotic disease right carotid bifurcation. 4. Moderate stenosis left vertebral artery which has origin from the arch. 5. These results were called by telephone at the time of interpretation on 03/09/2021 at 2:49 pm to provider [facility] , who verbally acknowledged these results.   Electronically Signed By: Franchot Gallo M.D. On: 03/09/2021 14:51   Statin:  Yes.   Beta Blocker:  Yes.   Aspirin:  Yes.   ACEI:  No. ARB:  Yes.   CCB use:  Yes Other antiplatelets/anticoagulants:  Yes.   apixiban   ASSESSMENT/PLAN: This is a 75 y.o. male who presents with two episodes of left amaurosis fugax in the last 24 hours. CTA neck reveals filling defect within the left ICA stent. He is being placed on heparin infusion. Dr. Donnetta Hutching, vascular surgeon on call, has evaluated the patient and provided the patient and his wife surgical options.   -Barbie Banner PA-C [facility] [phone] 03/09/2021  4:39 PM     I have examined the patient, reviewed and agree with above.  Very complex patient.  History and physical outlined above.  Had initial left carotid endarterectomy in 2007.  In 2011 was found to high-grade restenosis at the proximal patch and underwent transfemoral approach for carotid stenting.  Had recurrent asymptomatic stenosis and underwent restenting in 2020.  Presented with several distinct episodes of amaurosis fugax.  CT angiogram revealed subtotal occlusion of the left carotid stent with what appears to be thrombus.  The MRI of the brain showed a small acute/subacute infarct within the left cerebellar hemisphere of questionable relationship.  I discussed his case with Dr.Arora.  Feel that his symptoms almost undoubtedly related to the lesion in his  carotid.  Concern regarding the crescendo events.  Recommend surgery tomorrow.  He is on Eliquis which is being held.  Will be on heparin drip tonight which will be held at time of surgery.  Discussed options with the patient and his wife present.  Most likely will proceed with resection of his carotid artery and interposition with saphenous vein.  Fortunately he does have a low carotid bifurcation with normal internal carotid which is accessible at the angle of his jaw.  Reviewed the case with Dr. Carlis Abbott as well.  The patient understands that one of our our surgeons will be doing the surgery tomorrow depending on OR availability.  Did discuss risk to include the risk of stroke.  I explained that his risk would be higher than standard 1 to 2% with elective endarterectomy but feel that his risk should be less than 5%.  Curt Jews, MD 03/09/2021 6:28 PM

## 2021-03-10 NOTE — Anesthesia Preprocedure Evaluation (Signed)
Anesthesia Evaluation  Patient identified by MRN, date of birth, ID band Patient awake    Reviewed: Allergy & Precautions, NPO status , Patient's Chart, lab work & pertinent test results, reviewed documented beta blocker date and time   Airway Mallampati: III  TM Distance: >3 FB Neck ROM: Full    Dental  (+) Teeth Intact, Caps, Dental Advisory Given   Pulmonary COPD,  COPD inhaler, Current Smoker and Patient abstained from smoking.,    Pulmonary exam normal breath sounds clear to auscultation       Cardiovascular hypertension, Pt. on medications and Pt. on home beta blockers + Peripheral Vascular Disease  Normal cardiovascular exam+ dysrhythmias Atrial Fibrillation  Rhythm:Regular Rate:Normal  Left Carotid stenosis S/P Left CEA 2007, with stents placed later    Neuro/Psych Hx/o cerebellar infarct CVA, No Residual Symptoms    GI/Hepatic negative GI ROS, Neg liver ROS,   Endo/Other  Hyperlipidemia Obesity  Renal/GU Renal InsufficiencyRenal disease  negative genitourinary   Musculoskeletal negative musculoskeletal ROS (+)   Abdominal (+) + obese,   Peds  Hematology Eliquis therapy- last dose yesterday am   Anesthesia Other Findings   Reproductive/Obstetrics                             Anesthesia Physical Anesthesia Plan  ASA: III  Anesthesia Plan: General   Post-op Pain Management:    Induction: Intravenous  PONV Risk Score and Plan: 2 and Ondansetron, Treatment may vary due to age or medical condition and Dexamethasone  Airway Management Planned: Oral ETT  Additional Equipment: Arterial line  Intra-op Plan:   Post-operative Plan: Extubation in OR  Informed Consent: I have reviewed the patients History and Physical, chart, labs and discussed the procedure including the risks, benefits and alternatives for the proposed anesthesia with the patient or authorized representative who  has indicated his/her understanding and acceptance.     Dental advisory given  Plan Discussed with: CRNA and Anesthesiologist  Anesthesia Plan Comments: (Remifentanil infusion.)        Anesthesia Quick Evaluation

## 2021-03-10 NOTE — Progress Notes (Signed)
ANTICOAGULATION CONSULT NOTE - Follow Up Consult  Pharmacy Consult for heparin Indication: Afib and carotid stenosis due to stent thrombosis   Labs: Recent Labs    03/09/21 1415 03/09/21 1427 03/09/21 2330  HGB 14.4 15.0  --   HCT 43.4 44.0  --   PLT 204  --   --   APTT 41*  --  131*  LABPROT 14.9  --   --   INR 1.2  --   --   HEPARINUNFRC  --   --  1.80*  CREATININE 2.90* 3.00*  --    Assessment: 75yo male supratherapeutic on heparin with initial dosing while Eliquis on hold; no gtt issues or signs of bleeding per RN other than some bleeding at IV site earlier that resolved w/ dressing change; of note RN had to pause heparin and draw labs from PIV, which could have affected PTT.  Goal of Therapy:  aPTT 66-102 seconds   Plan:  Will decrease heparin gtt by 4 units/kg/hr to 1300 units/hr and check PTT in 8 hours.    Wynona Neat, PharmD, BCPS  03/10/2021,1:48 AM

## 2021-03-10 NOTE — Progress Notes (Signed)
STROKE TEAM PROGRESS NOTE   SUBJECTIVE (INTERVAL HISTORY) His wife is at the bedside.  Overall his condition is completely resolved. Pt stated that his left eye vision is at baseline. He did have 2 episode of left eye vision deficit but now is back to baseline. CTA head and neck concerning for left ICA in stent thrombus, now on heparin IV. Pending surgery with Dr. Carlis Abbott today. However, MRI showed small left cerebellar infarct, concerning for a cardioembolic event.    OBJECTIVE Temp:  [97.7 F (36.5 C)] 97.7 F (36.5 C) (04/05 1606) Pulse Rate:  [35-82] 73 (04/06 1515) Resp:  [10-21] 16 (04/06 1515) BP: (122-191)/(51-128) 160/82 (04/06 1515) SpO2:  [88 %-99 %] 96 % (04/06 1515) Weight:  [103 kg] 103 kg (04/05 1606)  Recent Labs  Lab 03/09/21 1421  GLUCAP 101*   Recent Labs  Lab 03/09/21 1415 03/09/21 1427 03/09/21 2135 03/10/21 0255  NA 133* 134*  --  134*  K 4.5 4.4 3.8 4.0  CL 102 104  --  104  CO2 21*  --   --  22  GLUCOSE 106* 102*  --  90  BUN 33* 40*  --  32*  CREATININE 2.90* 3.00*  --  2.76*  CALCIUM 8.9  --   --  8.9  MG  --   --  1.5* 2.2  PHOS  --   --  3.8  --    Recent Labs  Lab 03/09/21 1415  AST 24  ALT 16  ALKPHOS 73  BILITOT 0.6  PROT 6.0*  ALBUMIN 3.0*   Recent Labs  Lab 03/09/21 1415 03/09/21 1427 03/10/21 0255  WBC 12.4*  --  10.0  NEUTROABS 8.1*  --   --   HGB 14.4 15.0 13.1  HCT 43.4 44.0 38.6*  MCV 93.5  --  91.9  PLT 204  --  202   No results for input(s): CKTOTAL, CKMB, CKMBINDEX, TROPONINI in the last 168 hours. Recent Labs    03/09/21 1415  LABPROT 14.9  INR 1.2   Recent Labs    03/09/21 2157  COLORURINE YELLOW  LABSPEC 1.018  PHURINE 6.0  GLUCOSEU 50*  HGBUR SMALL*  BILIRUBINUR NEGATIVE  KETONESUR NEGATIVE  PROTEINUR 100*  NITRITE NEGATIVE  LEUKOCYTESUR NEGATIVE       Component Value Date/Time   CHOL 145 03/10/2021 0254   TRIG 126 03/10/2021 0254   HDL 33 (L) 03/10/2021 0254   CHOLHDL 4.4 03/10/2021 0254    VLDL 25 03/10/2021 0254   LDLCALC 87 03/10/2021 0254   Lab Results  Component Value Date   HGBA1C 6.1 (H) 03/10/2021      Component Value Date/Time   LABOPIA NONE DETECTED 03/09/2021 2157   COCAINSCRNUR NONE DETECTED 03/09/2021 2157   LABBENZ NONE DETECTED 03/09/2021 2157   AMPHETMU NONE DETECTED 03/09/2021 2157   THCU NONE DETECTED 03/09/2021 2157   LABBARB NONE DETECTED 03/09/2021 2157    Recent Labs  Lab 03/09/21 1415  ETH <10    I have personally reviewed the radiological images below and agree with the radiology interpretations.  CT ANGIO HEAD W OR WO CONTRAST  Result Date: 03/09/2021 CLINICAL DATA:  Acute neuro deficit. Left eye monocular blindness, resolved. History of left carotid stent 2020 EXAM: CT ANGIOGRAPHY HEAD AND NECK TECHNIQUE: Multidetector CT imaging of the head and neck was performed using the standard protocol during bolus administration of intravenous contrast. Multiplanar CT image reconstructions and MIPs were obtained to evaluate the vascular anatomy. Carotid stenosis  measurements (when applicable) are obtained utilizing NASCET criteria, using the distal internal carotid diameter as the denominator. CONTRAST:  18m OMNIPAQUE IOHEXOL 350 MG/ML SOLN COMPARISON:  CT head 03/09/2021 FINDINGS: CTA NECK FINDINGS Aortic arch: Atherosclerotic calcification aortic arch without aneurysm. Left vertebral artery origin from the arch with a moderate stenosis at the origin. Right carotid system: Mild atherosclerotic disease in the right common carotid artery and right carotid bifurcation without significant stenosis. Left carotid system: Stent across the left carotid bifurcation. There is a filling defect within the lumen of the stent distally likely due to thrombus or atherosclerotic plaque. This is causing significant narrowing of the lumen however there is opacification of the internal carotid artery above the stent. There is a moderate stenosis of the origin of the left  external carotid artery. Vertebral arteries: Mild atherosclerotic disease proximal right vertebral artery without stenosis. Right vertebral artery patent to the basilar Left vertebral origin from the arch with a moderate stenosis at the origin. Left vertebral artery is then patent to the basilar without additional stenosis. Skeleton: Cervical spondylosis.  No acute skeletal abnormality. Other neck: Negative for mass or adenopathy Upper chest: Lung apices clear bilaterally. Review of the MIP images confirms the above findings CTA HEAD FINDINGS Anterior circulation: Mild atherosclerotic calcification in the cavernous carotid bilaterally without stenosis. Anterior cerebral arteries patent bilaterally. Right middle cerebral artery widely patent Decreased caliber of the left middle cerebral artery compared to the right likely due to hypoperfusion. No focal stenosis or large vessel occlusion. Posterior circulation: Both vertebral arteries patent to the basilar. PICA patent bilaterally. Basilar widely patent. AICA, superior cerebellar, and posterior cerebral arteries patent without stenosis or large vessel occlusion. Venous sinuses: Normal venous enhancement. Right transverse sinus dominant. Small left transverse sinus. Anatomic variants: None Review of the MIP images confirms the above findings IMPRESSION: 1. Left carotid stenting across the bifurcation. The stent is patent however there is filling defect within the distal stent which may be due to acute thrombus or atherosclerotic plaque. This appears to be causing significant stenosis. 2. Small left middle cerebral artery compared to the right likely due to hypoperfusion. No intracranial large vessel occlusion 3. Atherosclerotic aorta. Mild atherosclerotic disease right carotid bifurcation. 4. Moderate stenosis left vertebral artery which has origin from the arch. 5. These results were called by telephone at the time of interpretation on 03/09/2021 at 2:49 pm to [facility], who verbally acknowledged these results. Electronically Signed   By: CFranchot GalloM.D.   On: 03/09/2021 14:51   CT ANGIO NECK W OR WO CONTRAST  Result Date: 03/09/2021 CLINICAL DATA:  Acute neuro deficit. Left eye monocular blindness, resolved. History of left carotid stent 2020 EXAM: CT ANGIOGRAPHY HEAD AND NECK TECHNIQUE: Multidetector CT imaging of the head and neck was performed using the standard protocol during bolus administration of intravenous contrast. Multiplanar CT image reconstructions and MIPs were obtained to evaluate the vascular anatomy. Carotid stenosis measurements (when applicable) are obtained utilizing NASCET criteria, using the distal internal carotid diameter as the denominator. CONTRAST:  721mOMNIPAQUE IOHEXOL 350 MG/ML SOLN COMPARISON:  CT head 03/09/2021 FINDINGS: CTA NECK FINDINGS Aortic arch: Atherosclerotic calcification aortic arch without aneurysm. Left vertebral artery origin from the arch with a moderate stenosis at the origin. Right carotid system: Mild atherosclerotic disease in the right common carotid artery and right carotid bifurcation without significant stenosis. Left carotid system: Stent across the left carotid bifurcation. There is a filling defect within the lumen of the  stent distally likely due to thrombus or atherosclerotic plaque. This is causing significant narrowing of the lumen however there is opacification of the internal carotid artery above the stent. There is a moderate stenosis of the origin of the left external carotid artery. Vertebral arteries: Mild atherosclerotic disease proximal right vertebral artery without stenosis. Right vertebral artery patent to the basilar Left vertebral origin from the arch with a moderate stenosis at the origin. Left vertebral artery is then patent to the basilar without additional stenosis. Skeleton: Cervical spondylosis.  No acute skeletal abnormality. Other neck: Negative for mass or adenopathy Upper  chest: Lung apices clear bilaterally. Review of the MIP images confirms the above findings CTA HEAD FINDINGS Anterior circulation: Mild atherosclerotic calcification in the cavernous carotid bilaterally without stenosis. Anterior cerebral arteries patent bilaterally. Right middle cerebral artery widely patent Decreased caliber of the left middle cerebral artery compared to the right likely due to hypoperfusion. No focal stenosis or large vessel occlusion. Posterior circulation: Both vertebral arteries patent to the basilar. PICA patent bilaterally. Basilar widely patent. AICA, superior cerebellar, and posterior cerebral arteries patent without stenosis or large vessel occlusion. Venous sinuses: Normal venous enhancement. Right transverse sinus dominant. Small left transverse sinus. Anatomic variants: None Review of the MIP images confirms the above findings IMPRESSION: 1. Left carotid stenting across the bifurcation. The stent is patent however there is filling defect within the distal stent which may be due to acute thrombus or atherosclerotic plaque. This appears to be causing significant stenosis. 2. Small left middle cerebral artery compared to the right likely due to hypoperfusion. No intracranial large vessel occlusion 3. Atherosclerotic aorta. Mild atherosclerotic disease right carotid bifurcation. 4. Moderate stenosis left vertebral artery which has origin from the arch. 5. These results were called by telephone at the time of interpretation on 03/09/2021 at 2:49 pm to provider [facility] , who verbally acknowledged these results. Electronically Signed   By: Franchot Gallo M.D.   On: 03/09/2021 14:51   MR BRAIN WO CONTRAST  Result Date: 03/09/2021 CLINICAL DATA:  Neuro deficit, acute, stroke suspected. EXAM: MRI HEAD WITHOUT CONTRAST TECHNIQUE: Multiplanar, multiecho pulse sequences of the brain and surrounding structures were obtained without intravenous contrast. COMPARISON:  Head CT March 09, 2021.  FINDINGS: Brain: Small focus of restricted diffusion within the left cerebellar hemisphere consistent with acute/subacute infarct. Remote lacunar infarct in the right corona radiata. Scattered foci of T2 hyperintensity within the white matter of the cerebral hemispheres, nonspecific, most likely related to chronic small vessel ischemia. No hemorrhage, hydrocephalus, extra-axial collection or mass lesion. Vascular: Normal flow voids. Skull and upper cervical spine: Normal marrow signal. Sinuses/Orbits: Negative. Other: Left mastoid effusion. IMPRESSION: 1. Small acute/subacute infarct within the left cerebellar hemisphere. 2. Remote lacunar infarct in the right corona radiata. 3. Mild chronic small vessel ischemia. 4. Left mastoid effusion. These results were called by secure text paging at the time of interpretation on 03/09/2021 at 3:55 pm to provider [place] . Electronically Signed   By: Pedro Earls M.D.   On: 03/09/2021 15:57   ECHOCARDIOGRAM COMPLETE  Result Date: 03/10/2021    ECHOCARDIOGRAM REPORT   Patient Name:   [name] Date of Exam: 03/10/2021 Medical Rec #:  [id number]      Height:       72.0 in Accession #:    [id number]     Weight:       227.1 lb Date of Birth:  04-Jun-1946     BSA:  2.249 m Patient Age:    12 years       BP:           147/64 mmHg Patient Gender: M              HR:           54 bpm. Exam Location:  Inpatient Procedure: 2D Echo Indications:    Stroke  History:        Patient has prior history of Echocardiogram examinations, most                 recent 08/27/2019. Risk Factors:Hypertension and Dyslipidemia.  Sonographer:    Mikki Santee RDCS (AE) Referring Phys: [id number] [place]  1. Left ventricular ejection fraction, by estimation, is 55 to 60%. The left ventricle has normal function. The left ventricle has no regional wall motion abnormalities. There is mild left ventricular hypertrophy. Left ventricular diastolic parameters  are consistent with Grade I diastolic dysfunction (impaired relaxation).  2. Right ventricular systolic function is normal. The right ventricular size is normal.  3. The mitral valve is grossly normal. Trivial mitral valve regurgitation.  4. The aortic valve is tricuspid. Aortic valve regurgitation is not visualized.  5. The inferior vena cava is normal in size with <50% respiratory variability, suggesting right atrial pressure of 8 mmHg. Comparison(s): No significant change from prior study. 08/27/2019: LVEF 55-60%. Conclusion(s)/Recommendation(s): No intracardiac source of embolism detected on this transthoracic study. A transesophageal echocardiogram is recommended to exclude cardiac source of embolism if clinically indicated. FINDINGS  Left Ventricle: Left ventricular ejection fraction, by estimation, is 55 to 60%. The left ventricle has normal function. The left ventricle has no regional wall motion abnormalities. The left ventricular internal cavity size was normal in size. There is  mild left ventricular hypertrophy. Left ventricular diastolic parameters are consistent with Grade I diastolic dysfunction (impaired relaxation). Indeterminate filling pressures. Right Ventricle: The right ventricular size is normal. No increase in right ventricular wall thickness. Right ventricular systolic function is normal. Left Atrium: Left atrial size was normal in size. Right Atrium: Right atrial size was normal in size. Pericardium: There is no evidence of pericardial effusion. Mitral Valve: The mitral valve is grossly normal. Trivial mitral valve regurgitation. Tricuspid Valve: The tricuspid valve is grossly normal. Tricuspid valve regurgitation is trivial. Aortic Valve: The aortic valve is tricuspid. Aortic valve regurgitation is not visualized. Pulmonic Valve: The pulmonic valve was normal in structure. Pulmonic valve regurgitation is not visualized. Aorta: The aortic root and ascending aorta are structurally normal,  with no evidence of dilitation. Venous: The inferior vena cava is normal in size with less than 50% respiratory variability, suggesting right atrial pressure of 8 mmHg. IAS/Shunts: The interatrial septum was not well visualized.  LEFT VENTRICLE PLAX 2D LVIDd:         4.30 cm  Diastology LVIDs:         3.30 cm  LV e' medial:    5.11 cm/s LV PW:         1.10 cm  LV E/e' medial:  20.2 LV IVS:        1.10 cm  LV e' lateral:   8.81 cm/s LVOT diam:     2.50 cm  LV E/e' lateral: 11.7 LV SV:         112 LV SV Index:   50 LVOT Area:     4.91 cm  RIGHT VENTRICLE RV S prime:     13.30 cm/s TAPSE (  M-mode): 1.8 cm LEFT ATRIUM             Index       RIGHT ATRIUM           Index LA diam:        4.50 cm 2.00 cm/m  RA Area:     18.30 cm LA Vol (A2C):   63.8 ml 28.37 ml/m RA Volume:   46.20 ml  20.55 ml/m LA Vol (A4C):   47.7 ml 21.21 ml/m LA Biplane Vol: 55.3 ml 24.59 ml/m  AORTIC VALVE LVOT Vmax:   101.00 cm/s LVOT Vmean:  67.800 cm/s LVOT VTI:    0.228 m  AORTA Ao Root diam: 3.00 cm MITRAL VALVE MV Area (PHT): 2.24 cm     SHUNTS MV Decel Time: 338 msec     Systemic VTI:  0.23 m MV E velocity: 103.25 cm/s  Systemic Diam: 2.50 cm MV A velocity: 122.00 cm/s MV E/A ratio:  0.85 Lyman Bishop MD Electronically signed by Lyman Bishop MD Signature Date/Time: 03/10/2021/3:25:08 PM    Final    CT HEAD CODE STROKE WO CONTRAST  Result Date: 03/09/2021 CLINICAL DATA:  Code stroke. Acute neuro deficit. Vision problem left eye. EXAM: CT HEAD WITHOUT CONTRAST TECHNIQUE: Contiguous axial images were obtained from the base of the skull through the vertex without intravenous contrast. COMPARISON:  None. FINDINGS: Brain: No evidence of acute infarction, hemorrhage, hydrocephalus, extra-axial collection or mass lesion/mass effect. Vascular: Negative for hyperdense vessel Skull: Negative Sinuses/Orbits: Negative Other: None ASPECTS ([place] Stroke Program Early CT Score) - Ganglionic level infarction (caudate, lentiform nuclei, internal  capsule, insula, M1-M3 cortex): 7 - Supraganglionic infarction (M4-M6 cortex): 3 Total score (0-10 with 10 being normal): 10 IMPRESSION: 1. No acute intracranial abnormality 2. ASPECTS is 10 3. Code stroke imaging results were communicated on 03/09/2021 at 2:36 pm to provider Rory Percy via text page Electronically Signed   By: Franchot Gallo M.D.   On: 03/09/2021 14:37    PHYSICAL EXAM  Temp:  [97.7 F (36.5 C)] 97.7 F (36.5 C) (04/05 1606) Pulse Rate:  [35-82] 73 (04/06 1515) Resp:  [10-21] 16 (04/06 1515) BP: (122-191)/(51-128) 160/82 (04/06 1515) SpO2:  [88 %-99 %] 96 % (04/06 1515) Weight:  VR:[id number] kg] 103 kg (04/05 1606)  General - Well nourished, well developed, in no apparent distress.  Ophthalmologic - fundi not visualized due to noncooperation.  Cardiovascular - Regular rhythm and rate.  Mental Status -  Level of arousal and orientation to time, place, and person were intact. Language including expression, naming, repetition, comprehension was assessed and found intact. Fund of Knowledge was assessed and was intact.  Cranial Nerves II - XII - II - Visual field intact OU. III, IV, VI - Extraocular movements intact. V - Facial sensation intact bilaterally. VII - Facial movement intact bilaterally. VIII - Hearing & vestibular intact bilaterally. X - Palate elevates symmetrically. XI - Chin turning & shoulder shrug intact bilaterally. XII - Tongue protrusion intact.  Motor Strength - The patient's strength was normal in all extremities and pronator drift was absent.  Bulk was normal and fasciculations were absent.   Motor Tone - Muscle tone was assessed at the neck and appendages and was normal.  Reflexes - The patient's reflexes were symmetrical in all extremities and he had no pathological reflexes.  Sensory - Light touch, temperature/pinprick were assessed and were symmetrical.    Coordination - The patient had normal movements in the hands with no ataxia or dysmetria.  Tremor was absent.  Gait and Station - deferred.   ASSESSMENT/PLAN Mr. EFREM RYKEN is a 75 y.o. male with history of CKD stage III, hypertension, hyperlipidemia, PAD, A. fib on Eliquis, left CEA 2007 followed by stenting 2011 and 2020  admitted for episodes of left vision loss x2. No tPA given due to symptom resolved.    Amaurosis fugax Carotid stenosis, left  Transient episodes of left vision loss  History of left CEA 2007 followed by stenting 2011 and 2020  CT head and neck filling defect at distal left ICA stent with focal stenosis.  Left VA moderate stenosis at the origin.  Vascular surgery on board  On heparin IV  Planning for surgery with resection of his carotid artery and interposition with saphenous vein 03/10/2021  Stroke, incidental finding:  left small cerebellar infarct, embolic secondary to known afib vs. Large vessel disease source due to left VA origin stenosis  MRI left small/punctate cerebellar infarct  CT head and neck filling defect at distal left ICA stent with focal stenosis.  Left VA moderate stenosis at the origin.  2D Echo EF 55 to 60%  LDL 87  HgbA1c 6.1  Heparin IV for VTE prophylaxis  aspirin 81 mg daily and Eliquis (apixaban) daily prior to admission, now on heparin IV.   Ongoing aggressive stroke risk factor management  Therapy recommendations: Pending  Disposition: Pending  Chronic A. Fib  On Eliquis 5 mg twice daily PTA  Currently on heparin IV  Rate controlled  We will resume Eliquis after surgery if neuro stable  Unsustained V. Tach  Asymptomatic night of 03/09/2021  Magnesium 1.5  Status post magnesium supplement  Repeat magnesium 2.2  Hypertension . Stable on the high end . 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 After hours, contact General Neurology

## 2021-03-10 NOTE — Progress Notes (Signed)
  Echocardiogram 2D Echocardiogram has been performed.  Jennette Dubin 03/10/2021, 2:46 PM

## 2021-03-10 NOTE — Anesthesia Procedure Notes (Signed)
Arterial Line Insertion Start/End4/05/2021 4:10 PM, 03/10/2021 4:25 PM Performed by: Oletta Lamas, CRNA, CRNA  Preanesthetic checklist: patient identified, IV checked, site marked, risks and benefits discussed, surgical consent, monitors and equipment checked, pre-op evaluation, timeout performed and anesthesia consent Lidocaine 1% used for infiltration Left, radial was placed Catheter size: 20 G Hand hygiene performed , maximum sterile barriers used  and Seldinger technique used Allen's test indicative of satisfactory collateral circulation Attempts: 3 Procedure performed without using ultrasound guided technique. Following insertion, Biopatch and dressing applied. Post procedure assessment: normal  Patient tolerated the procedure well with no immediate complications. Additional procedure comments: 2 failed attempts. 1 by Royce Macadamia MDA and 1 by EW CRNA on right side.  Site clean with no hematoma. [name]

## 2021-03-10 NOTE — Transfer of Care (Signed)
Immediate Anesthesia Transfer of Care Note  Patient: [name]  Procedure(s) Performed: REDO LEFT CAROTID ENDARTERECTOMY WITH LEFT COMMON TO INTERNAL CAROTID ARTERY BYPASS (Left ) VEIN HARVEST FROM LEFT SAPHENOUS VEIN (Left Leg Upper) LIGATION OF LEFT EXTERNAL CAROTID (Left Neck)  Patient Location: PACU  Anesthesia Type:General  Level of Consciousness: pateint uncooperative and confused  Airway & Oxygen Therapy: Patient connected to nasal cannula oxygen  Post-op Assessment: Report given to RN and Post -op Vital signs reviewed and stable  Post vital signs: Reviewed and stable  Last Vitals:  Vitals Value Taken Time  BP 143/99 03/10/21 1942  Temp    Pulse 88 03/10/21 1949  Resp 24 03/10/21 1949  SpO2 96 % 03/10/21 1949  Vitals shown include unvalidated device data.  Last Pain:  Vitals:   03/09/21 1606  TempSrc: Oral  PainSc: 0-No pain         Complications: No complications documented.

## 2021-03-10 NOTE — Progress Notes (Signed)
PT Cancellation Note  Patient Details Name: [name] MRN: [id number] DOB: 1946-05-07   Cancelled Treatment:    Reason Eval/Treat Not Completed: Active bedrest order Will follow up as activity orders updated and as schedule allows.   [name], PT, DPT  Acute Rehabilitation Services  Pager: [phone] Office: [phone]    Rudean Hitt 03/10/2021, 9:12 AM

## 2021-03-10 NOTE — OR Nursing (Signed)
Patient's dentures were brought into the OR and placed at the nursing desk during the procedure to be left with his chart. They will be transported to PACU with his chart once the procedure is over.

## 2021-03-10 NOTE — Anesthesia Procedure Notes (Signed)
Procedure Name: Intubation Date/Time: 03/10/2021 4:43 PM Performed by: Oletta Lamas, CRNA Pre-anesthesia Checklist: Patient identified, Emergency Drugs available, Suction available and Patient being monitored Patient Re-evaluated:Patient Re-evaluated prior to induction Oxygen Delivery Method: Circle System Utilized Preoxygenation: Pre-oxygenation with 100% oxygen Induction Type: IV induction Ventilation: Mask ventilation without difficulty and Oral airway inserted - appropriate to patient size Laryngoscope Size: Mac and 4 Tube type: Oral Tube size: 7.5 mm Number of attempts: 1 Airway Equipment and Method: Stylet Placement Confirmation: ETT inserted through vocal cords under direct vision,  positive ETCO2 and breath sounds checked- equal and bilateral Secured at: 23 cm Tube secured with: Tape Dental Injury: Teeth and Oropharynx as per pre-operative assessment

## 2021-03-10 NOTE — Op Note (Signed)
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  Harvest of left leg great saphenous vein 2.  Redo left carotid endarterectomy with excision of carotid stents  3.  Ligation of left external carotid artery 4.  Left common carotid artery to internal carotid artery bypass with great saphenous vein  Surgeon: Dr. Marty Heck, MD  Assistant: Dr. Curt Jews MD and Arlee Muslim, PA  Indications: Patient is a 75 year old male who has a very complex history.  He previously had a left carotid endarterectomy in 2007 for an asymptomatic stenosis by Dr. Oneida Alar.  He then had a recurrent stenosis and underwent transfemoral stenting in 2011 and again in 2020 for recurrent stenosis.  He was seen in the ED last night by Dr. Donnetta Hutching with TIA event and evidence of recurrent high-grade stenosis in his left carotid stent in the ICA.  He presents today for planned redo carotid endarterectomy with removal of his stents and left common carotid to internal carotid artery bypass.  An assistant was needed for exposure and to expedite the case.  Findings: After careful dissection we were able to identify the vagus nerve but this was severely adherent to the previous dacron patch of his previous endarterectomy.  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  Ultimately we were able to get control proximal to his carotid stent and distal to his carotid stents.  Ultimately we ligated the external carotid artery.  I then opened the carotid artery longitudinal fashion and performed a redo endarterectomy removing all of his previous stents.  There was evidence of recurrent intimal hyperplasia within the carotid stents.  A left common carotid to internal carotid artery bypass was sewn with great saphenous vein.  Anesthesia: General  Details: Patient was taken to the operating room after informed consent was obtained.  He was  placed on the operating table supine position.  Subsequently marked out his carotid bifurcation as well as the proximal and distal extent of his carotid stents on the left neck.  Also use ultrasound to evaluate saphenous vein in the left leg and this was of good caliber in the thigh and this was marked.  At that time the neck and left leg were then prepped and draped in usual sterile fashion.  A timeout was performed.  Initially made a longitudinal incision over his previous scar from his previous endarterectomy.  Carried our dissection through the platysma and did have to ligate the external jugular vein.  There sternocleidomastoid was mobilized.  Retractors were placed.  We were able to identify the internal jugular vein that was severely adherent to the carotid where we could appreciate the stent by palpation.  We then carefully dissected with Metzenbaum scissors the common carotid artery as well as the external and internal carotid artery and got both proximal and distal to the stent.  Proximal to the stent we could identify the vagus nerve which was severely adherent to the dacron patch of the previous endarterectomy and we elected not to mobilize this off the patch for fear of injuring it.  Finally once we had enough carotid mobilized patient was given 100 units/kg heparin IV.  Once we had an ACT greater than 250 we proceeded by clamping the distal ICA past the stent where it was soft with a Kitzmiller clamp.  The common carotid was controlled proximal to the carotid stents with a baby profunda.  At that point time I  then got a vascular clamp on the external carotid this was ligated and oversewn with a 5-0 Prolene baseball stitch to the stump.  That point time we took Potts scissors and opened the external carotid stump onto the common carotid and visualize the stents and then we were able to carry this all the way to the distal portion of the stent in the ICA.  I used Garment/textile technologist and all the previous  stents were then removed with endarterectomy.  I elected to leave the dacryon patch given that the vagus nerve was severely adherent to this.   While doing the carotid exposure Dr. Donnetta Hutching had made a longitudinal incision in the left thigh and the great saphenous vein was of good caliber.  It was circumferentially dissected and all side branches were ligated between 3-0 silk ties and divided.  Finally it was ligated between right angle clamps passed off the field and vessel cannula was placed and was flushed and of good caliber.  This incision was closed with 3-0 Vicryl and 4-0 Monocryl. That point time we then brought the vein on the field and maintained its proper orientation.  The vein was then spatulated and sewn end-to-end with a 5-0 Prolene to the proximal common carotid artery in parachute technique.  When we came off clamps we had good hemostasis at the suture line.  There was a good pulse in the vein graft.  I then pulled the vein to the appropriate length and the vein was cut and spatulated.  Internal carotid artery was transected distal to the stent and this was also spatulated and appeared to be soft and good for sewing.  We then performed an end-to-end primary anastomosis to the internal carotid artery using 6-0 Prolene parachute technique and did de-air everything prior to completion.  Once we came off clamps we had good Doppler flow in the ICA distally.  Patient was given 50 mg protamine for reversal.  I used Surgicel snow to get hemostasis.  Once we had good hemostasis I then reapproximated the platysma and with 3-0 Vicryl and the skin were closed with 4-0 Monocryl and Dermabond.  Patient was awakened from anesthesia with no deficits and taken to recovery in stable condition.  Complication: None  Condition: Stable  Marty Heck, MD [facility] Office: [place]

## 2021-03-10 NOTE — Progress Notes (Signed)
HD#1 Subjective:  Overnight Events: Patient had brief episode of Vtach vs SVT treated with Magnesium 2g. O2 saturation drop-ped to 82% while patient sleeping as per nurse, placed on 2L [place].    Patient states he is doing well on morning rounds. Complains of left upper back pain from lying in uncomfortable  bed. Denies any new change in vision, weakness, HA, CP or SOB. States he is ready to get surgery over so he can get out of here.   Objective:  Vital signs in last 24 hours: Vitals:   03/10/21 1030 03/10/21 1045 03/10/21 1100 03/10/21 1115  BP: (!) 139/51 (!) 126/53 (!) 147/71 (!) 171/64  Pulse: (!) 54 (!) 49 (!) 54 (!) 35  Resp: '12 13 12 13  '$ Temp:      TempSrc:      SpO2: 90% (!) 88% 90% (!) 89%  Weight:      Height:       Supplemental O2: Room Air SpO2: (!) 89 %   Physical Exam:  Constitutional: well-appearing male sitting in bed, in no acute distress Eyes: conjunctiva non-erythematous Cardiovascular: regular rate, irregular rhythm  Pulmonary/Chest: normal work of breathing on room air, lungs clear to auscultation bilaterally Abdominal: soft, non-tender, non-distended MSK: normal bulk and tone Neurological: alert & oriented x 3 Skin: warm and dry, LE with redness and scaling distal to knees bilaterally.    Filed Weights   03/09/21 1426 03/09/21 1606  Weight: 103 kg 103 kg     Intake/Output Summary (Last 24 hours) at 03/10/2021 1314 Last data filed at 03/10/2021 0042 Gross per 24 hour  Intake 50 ml  Output --  Net 50 ml   Net IO Since Admission: 50 mL [03/10/21 1314]  Pertinent Labs: CBC Latest Ref Rng & Units 03/10/2021 03/09/2021 03/09/2021  WBC 4.0 - 10.5 K/uL 10.0 - 12.4(H)  Hemoglobin 13.0 - 17.0 g/dL 13.1 15.0 14.4  Hematocrit 39.0 - 52.0 % 38.6(L) 44.0 43.4  Platelets 150 - 400 K/uL 202 - 204    CMP Latest Ref Rng & Units 03/10/2021 03/09/2021 03/09/2021  Glucose 70 - 99 mg/dL 90 - 102(H)  BUN 8 - 23 mg/dL 32(H) - 40(H)  Creatinine 0.61 - 1.24 mg/dL 2.76(H) -  3.00(H)  Sodium 135 - 145 mmol/L 134(L) - 134(L)  Potassium 3.5 - 5.1 mmol/L 4.0 3.8 4.4  Chloride 98 - 111 mmol/L 104 - 104  CO2 22 - 32 mmol/L 22 - -  Calcium 8.9 - 10.3 mg/dL 8.9 - -  Total Protein 6.5 - 8.1 g/dL - - -  Total Bilirubin 0.3 - 1.2 mg/dL - - -  Alkaline Phos 38 - 126 U/L - - -  AST 15 - 41 U/L - - -  ALT 0 - 44 U/L - - -    Imaging: CT ANGIO HEAD W OR WO CONTRAST  Result Date: 03/09/2021 CLINICAL DATA:  Acute neuro deficit. Left eye monocular blindness, resolved. History of left carotid stent 2020 EXAM: CT ANGIOGRAPHY HEAD AND NECK TECHNIQUE: Multidetector CT imaging of the head and neck was performed using the standard protocol during bolus administration of intravenous contrast. Multiplanar CT image reconstructions and MIPs were obtained to evaluate the vascular anatomy. Carotid stenosis measurements (when applicable) are obtained utilizing NASCET criteria, using the distal internal carotid diameter as the denominator. CONTRAST:  55m OMNIPAQUE IOHEXOL 350 MG/ML SOLN COMPARISON:  CT head 03/09/2021 FINDINGS: CTA NECK FINDINGS Aortic arch: Atherosclerotic calcification aortic arch without aneurysm. Left vertebral artery origin from  the arch with a moderate stenosis at the origin. Right carotid system: Mild atherosclerotic disease in the right common carotid artery and right carotid bifurcation without significant stenosis. Left carotid system: Stent across the left carotid bifurcation. There is a filling defect within the lumen of the stent distally likely due to thrombus or atherosclerotic plaque. This is causing significant narrowing of the lumen however there is opacification of the internal carotid artery above the stent. There is a moderate stenosis of the origin of the left external carotid artery. Vertebral arteries: Mild atherosclerotic disease proximal right vertebral artery without stenosis. Right vertebral artery patent to the basilar Left vertebral origin from the arch  with a moderate stenosis at the origin. Left vertebral artery is then patent to the basilar without additional stenosis. Skeleton: Cervical spondylosis.  No acute skeletal abnormality. Other neck: Negative for mass or adenopathy Upper chest: Lung apices clear bilaterally. Review of the MIP images confirms the above findings CTA HEAD FINDINGS Anterior circulation: Mild atherosclerotic calcification in the cavernous carotid bilaterally without stenosis. Anterior cerebral arteries patent bilaterally. Right middle cerebral artery widely patent Decreased caliber of the left middle cerebral artery compared to the right likely due to hypoperfusion. No focal stenosis or large vessel occlusion. Posterior circulation: Both vertebral arteries patent to the basilar. PICA patent bilaterally. Basilar widely patent. AICA, superior cerebellar, and posterior cerebral arteries patent without stenosis or large vessel occlusion. Venous sinuses: Normal venous enhancement. Right transverse sinus dominant. Small left transverse sinus. Anatomic variants: None Review of the MIP images confirms the above findings IMPRESSION: 1. Left carotid stenting across the bifurcation. The stent is patent however there is filling defect within the distal stent which may be due to acute thrombus or atherosclerotic plaque. This appears to be causing significant stenosis. 2. Small left middle cerebral artery compared to the right likely due to hypoperfusion. No intracranial large vessel occlusion 3. Atherosclerotic aorta. Mild atherosclerotic disease right carotid bifurcation. 4. Moderate stenosis left vertebral artery which has origin from the arch. 5. These results were called by telephone at the time of interpretation on 03/09/2021 at 2:49 pm to provider [facility] , who verbally acknowledged these results. Electronically Signed   By: Franchot Gallo M.D.   On: 03/09/2021 14:51   CT ANGIO NECK W OR WO CONTRAST  Result Date: 03/09/2021 CLINICAL DATA:   Acute neuro deficit. Left eye monocular blindness, resolved. History of left carotid stent 2020 EXAM: CT ANGIOGRAPHY HEAD AND NECK TECHNIQUE: Multidetector CT imaging of the head and neck was performed using the standard protocol during bolus administration of intravenous contrast. Multiplanar CT image reconstructions and MIPs were obtained to evaluate the vascular anatomy. Carotid stenosis measurements (when applicable) are obtained utilizing NASCET criteria, using the distal internal carotid diameter as the denominator. CONTRAST:  62m OMNIPAQUE IOHEXOL 350 MG/ML SOLN COMPARISON:  CT head 03/09/2021 FINDINGS: CTA NECK FINDINGS Aortic arch: Atherosclerotic calcification aortic arch without aneurysm. Left vertebral artery origin from the arch with a moderate stenosis at the origin. Right carotid system: Mild atherosclerotic disease in the right common carotid artery and right carotid bifurcation without significant stenosis. Left carotid system: Stent across the left carotid bifurcation. There is a filling defect within the lumen of the stent distally likely due to thrombus or atherosclerotic plaque. This is causing significant narrowing of the lumen however there is opacification of the internal carotid artery above the stent. There is a moderate stenosis of the origin of the left external carotid artery. Vertebral arteries: Mild atherosclerotic  disease proximal right vertebral artery without stenosis. Right vertebral artery patent to the basilar Left vertebral origin from the arch with a moderate stenosis at the origin. Left vertebral artery is then patent to the basilar without additional stenosis. Skeleton: Cervical spondylosis.  No acute skeletal abnormality. Other neck: Negative for mass or adenopathy Upper chest: Lung apices clear bilaterally. Review of the MIP images confirms the above findings CTA HEAD FINDINGS Anterior circulation: Mild atherosclerotic calcification in the cavernous carotid bilaterally  without stenosis. Anterior cerebral arteries patent bilaterally. Right middle cerebral artery widely patent Decreased caliber of the left middle cerebral artery compared to the right likely due to hypoperfusion. No focal stenosis or large vessel occlusion. Posterior circulation: Both vertebral arteries patent to the basilar. PICA patent bilaterally. Basilar widely patent. AICA, superior cerebellar, and posterior cerebral arteries patent without stenosis or large vessel occlusion. Venous sinuses: Normal venous enhancement. Right transverse sinus dominant. Small left transverse sinus. Anatomic variants: None Review of the MIP images confirms the above findings IMPRESSION: 1. Left carotid stenting across the bifurcation. The stent is patent however there is filling defect within the distal stent which may be due to acute thrombus or atherosclerotic plaque. This appears to be causing significant stenosis. 2. Small left middle cerebral artery compared to the right likely due to hypoperfusion. No intracranial large vessel occlusion 3. Atherosclerotic aorta. Mild atherosclerotic disease right carotid bifurcation. 4. Moderate stenosis left vertebral artery which has origin from the arch. 5. These results were called by telephone at the time of interpretation on 03/09/2021 at 2:49 pm to provider [facility] , who verbally acknowledged these results. Electronically Signed   By: Franchot Gallo M.D.   On: 03/09/2021 14:51   MR BRAIN WO CONTRAST  Result Date: 03/09/2021 CLINICAL DATA:  Neuro deficit, acute, stroke suspected. EXAM: MRI HEAD WITHOUT CONTRAST TECHNIQUE: Multiplanar, multiecho pulse sequences of the brain and surrounding structures were obtained without intravenous contrast. COMPARISON:  Head CT March 09, 2021. FINDINGS: Brain: Small focus of restricted diffusion within the left cerebellar hemisphere consistent with acute/subacute infarct. Remote lacunar infarct in the right corona radiata. Scattered foci of T2  hyperintensity within the white matter of the cerebral hemispheres, nonspecific, most likely related to chronic small vessel ischemia. No hemorrhage, hydrocephalus, extra-axial collection or mass lesion. Vascular: Normal flow voids. Skull and upper cervical spine: Normal marrow signal. Sinuses/Orbits: Negative. Other: Left mastoid effusion. IMPRESSION: 1. Small acute/subacute infarct within the left cerebellar hemisphere. 2. Remote lacunar infarct in the right corona radiata. 3. Mild chronic small vessel ischemia. 4. Left mastoid effusion. These results were called by secure text paging at the time of interpretation on 03/09/2021 at 3:55 pm to provider [place] . Electronically Signed   By: Pedro Earls M.D.   On: 03/09/2021 15:57   CT HEAD CODE STROKE WO CONTRAST  Result Date: 03/09/2021 CLINICAL DATA:  Code stroke. Acute neuro deficit. Vision problem left eye. EXAM: CT HEAD WITHOUT CONTRAST TECHNIQUE: Contiguous axial images were obtained from the base of the skull through the vertex without intravenous contrast. COMPARISON:  None. FINDINGS: Brain: No evidence of acute infarction, hemorrhage, hydrocephalus, extra-axial collection or mass lesion/mass effect. Vascular: Negative for hyperdense vessel Skull: Negative Sinuses/Orbits: Negative Other: None ASPECTS ([place] Stroke Program Early CT Score) - Ganglionic level infarction (caudate, lentiform nuclei, internal capsule, insula, M1-M3 cortex): 7 - Supraganglionic infarction (M4-M6 cortex): 3 Total score (0-10 with 10 being normal): 10 IMPRESSION: 1. No acute intracranial abnormality 2. ASPECTS is 10 3.  Code stroke imaging results were communicated on 03/09/2021 at 2:36 pm to provider Rory Percy via text page Electronically Signed   By: Franchot Gallo M.D.   On: 03/09/2021 14:37    Assessment/Plan:   Principal Problem:   Carotid artery thrombosis, left Active Problems:   CKD (chronic kidney disease), stage III (HCC)   HTN (hypertension)    Persistent atrial fibrillation (HCC)   Cerebellar infarction [facility])   Patient Summary: [name] is a 75 y.o. 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   Symptomatic left internal carotid artery stenosis  Hx of CVA without deficit. Left carotid endarterectomy 2007, L carotid stent placement 2007, last carotid duplex 2020 w/o deficit. Patient denies any new vision loss or neurological deficits. Lipid panel unremarkable. Heparin was supratheraputic and needed to be held, Aptt is coming down. Surgery scheduled today.  Cerebellar infarction  MRI with acute/subacute infarct of left cerebellar hemisphere. Stroke work-up advised as per neurology -Appreciate vascular surgery consult and recommendations -Appreciate neurology consult and recommendations  -Surgery scheduled for today -Permissive HTN <180 per neurology  -Hold home Eliquis, plan to restart after surgery  - F/u ECHO, scheduled  -Vitals, mNIHSS q2h for 12 hours  -Appreciate PT/OT consult and recommendations  -Heparin 100 units/mL -Continue rosuvastatin 20 mg qd  CKD stage IV Cr 2.90, BUN 33 on admission. Cr down to 2.76, BUN 32 on morning labs. Continues to be elevated from baseline ~2.4. Possible AKI of unknown etiology, will enquire further with patient prerenal vs obstructive. Patient was seeing nephrology when second episode of vision loss occurred. Will monitor closely  -Daily renal panel  -Consider IV hydration   HTN Patients BP elevated at 171/64 -Permissive HTN <180 as per above  -Hold home amlodipine, carvedilol until s/p surgery tomorrow   Persistent Afib s/p ablation  HFpEF Patient followed by cardiology. Rate well controlled in Afib.  Appears euvolemic on exam.  -Hold home Eliquis as per above  Diet: NPO IVF: None,None VTE: Heparin Code: Full PT/OT recs: Pending, none. Family Update: Wife present for morning rounds.    Dispo: Anticipated discharge to Home in 3 days pending recovery from surgery.   Mady Haagensen Pager [phone] Please contact the on call pager after 5 pm and on weekends at [phone].

## 2021-03-10 NOTE — Progress Notes (Addendum)
[place] for heparin Indication: Corotid stent thrombosis, stroke protocol  Allergies  Allergen Reactions  . Plavix [Clopidogrel] Rash    Patient Measurements: Height: 6' (182.9 cm) Weight: 103 kg (227 lb 1.2 oz) IBW/kg (Calculated) : 77.6 Heparin Dosing Weight: 98.8 kg  Vital Signs: BP: 153/91 (04/06 0900) Pulse Rate: 64 (04/06 0900)  Labs: Recent Labs    03/09/21 1415 03/09/21 1427 03/09/21 2330 03/10/21 0255 03/10/21 1000  HGB 14.4 15.0  --  13.1  --   HCT 43.4 44.0  --  38.6*  --   PLT 204  --   --  202  --   APTT 41*  --  131*  --  74*  LABPROT 14.9  --   --   --   --   INR 1.2  --   --   --   --   HEPARINUNFRC  --   --  1.80*  --   --   CREATININE 2.90* 3.00*  --  2.76*  --     Estimated Creatinine Clearance: 28.3 mL/min (A) (by C-G formula based on SCr of 2.76 mg/dL (H)).   Medical History: Past Medical History:  Diagnosis Date  . CKD (chronic kidney disease), stage III ([place])   . Dyspnea on exertion   . Edema   . HTN (hypertension)   . Hyperlipidemia   . Left carotid stenosis    a. 2007 s/p L CEA;  b. 2011 s/p L common carotid stenting 2/2 restenosis;  c. Q000111Q u/s: RICA [id number], LICA [id number], patent LCCA stent.  [name] PAD (peripheral artery disease) (HCC)    a. s/p prior RSFA, REIA, distal LSFA (known to be occluded), LEIA, and LCIA stenting;  b. 01/2015 ABI's: R 0.78, L 0.53.  [name] Stroke [facility])     Assessment: 75 y.o. male presenting with multiple transient episodes of vision loss in left eye. His PMH is significant for left carotid stenosis s/p left CEA (2007), left common carotid stenting secondary to restenosis (2011 and 2020), prior stroke without residual deficits, PAD, Afib on apixaban 5 mg BID PTA (last dose 4/5 AM. Pharmacy has been consulted by neurology to start IV heparin per stroke protocol. Vascular planning procedure 4/7.  APTT now therapeutic at 75 after rate decrease for high level this morning. CBC wnl. No  bleed issues reported.  Given recent DOAC use, will check aPTT and heparin level until correlating.    Goal of Therapy:  Heparin level 0.3-0.5 units/ml aPTT 66-85 seconds Monitor platelets by anticoagulation protocol: Yes   Plan:  Continue heparin infusion at 1300 units/hr Monitor daily heparin level and aPTT until correlating, CBC, s/sx bleeding Vascular procedure planned 4/6   Arturo Morton, PharmD, BCPS Please check AMION for all [place] contact numbers Clinical Pharmacist 03/10/2021 12:12 PM

## 2021-03-11 ENCOUNTER — Encounter (HOSPITAL_COMMUNITY): Payer: Self-pay | Admitting: Vascular Surgery

## 2021-03-11 LAB — CBC
HCT: 36.6 % — ABNORMAL LOW (ref 39.0–52.0)
Hemoglobin: 12.6 g/dL — ABNORMAL LOW (ref 13.0–17.0)
MCH: 31.9 pg (ref 26.0–34.0)
MCHC: 34.4 g/dL (ref 30.0–36.0)
MCV: 92.7 fL (ref 80.0–100.0)
Platelets: 189 10*3/uL (ref 150–400)
RBC: 3.95 MIL/uL — ABNORMAL LOW (ref 4.22–5.81)
RDW: 14.5 % (ref 11.5–15.5)
WBC: 14.8 10*3/uL — ABNORMAL HIGH (ref 4.0–10.5)
nRBC: 0 % (ref 0.0–0.2)

## 2021-03-11 LAB — PHOSPHORUS: Phosphorus: 4.6 mg/dL (ref 2.5–4.6)

## 2021-03-11 LAB — BASIC METABOLIC PANEL
Anion gap: 9 (ref 5–15)
BUN: 32 mg/dL — ABNORMAL HIGH (ref 8–23)
CO2: 18 mmol/L — ABNORMAL LOW (ref 22–32)
Calcium: 8.7 mg/dL — ABNORMAL LOW (ref 8.9–10.3)
Chloride: 108 mmol/L (ref 98–111)
Creatinine, Ser: 2.49 mg/dL — ABNORMAL HIGH (ref 0.61–1.24)
GFR, Estimated: 26 mL/min — ABNORMAL LOW (ref >60)
Glucose, Bld: 113 mg/dL — ABNORMAL HIGH (ref 70–99)
Potassium: 4.3 mmol/L (ref 3.5–5.1)
Sodium: 135 mmol/L (ref 135–145)

## 2021-03-11 LAB — MAGNESIUM: Magnesium: 1.8 mg/dL (ref 1.7–2.4)

## 2021-03-11 MED ORDER — MAGNESIUM SULFATE IN D5W 1-5 GM/100ML-% IV SOLN
1.0000 g | Freq: Once | INTRAVENOUS | Status: AC
  Administered 2021-03-11: 1 g via INTRAVENOUS
  Filled 2021-03-11: qty 100

## 2021-03-11 MED ORDER — ALUM & MAG HYDROXIDE-SIMETH 200-200-20 MG/5ML PO SUSP: 30.0000 mL | ORAL | Status: DC | PRN

## 2021-03-11 MED ORDER — OXYCODONE-ACETAMINOPHEN 7.5-325 MG PO TABS: 1.0000 | ORAL_TABLET | Freq: Four times a day (QID) | ORAL | 0 refills | Status: DC | PRN

## 2021-03-11 NOTE — Discharge Instructions (Signed)
   [facility]  Discharge Instructions   Carotid Endarterectomy (CEA)  Please refer to the following instructions for your post-procedure care. Your surgeon or physician assistant will discuss any changes with you.  Activity  You are encouraged to walk as much as you can. You can slowly return to normal activities but must avoid strenuous activity and heavy lifting until your doctor tell you it's OK. Avoid activities such as vacuuming or swinging a golf club. You can drive after one week if you are comfortable and you are no longer taking prescription pain medications. It is normal to feel tired for serval weeks after your surgery. It is also normal to have difficulty with sleep habits, eating, and bowel movements after surgery. These will go away with time.  Bathing/Showering  You may shower after you come home. Do not soak in a bathtub, hot tub, or swim until the incision heals completely.  Incision Care  Shower every day. Clean your incision with mild soap and water. Pat the area dry with a clean towel. You do not need a bandage unless otherwise instructed. Do not apply any ointments or creams to your incision. You may have skin glue on your incision. Do not peel it off. It will come off on its own in about one week. Your incision may feel thickened and raised for several weeks after your surgery. This is normal and the skin will soften over time. For Men Only: It's OK to shave around the incision but do not shave the incision itself for 2 weeks. It is common to have numbness under your chin that could last for several months.  Diet  Resume your normal diet. There are no special food restrictions following this procedure. A low fat/low cholesterol diet is recommended for all patients with vascular disease. In order to heal from your surgery, it is CRITICAL to get adequate nutrition. Your body requires vitamins, minerals, and protein. Vegetables are the best  source of vitamins and minerals. Vegetables also provide the perfect balance of protein. Processed food has little nutritional value, so try to avoid this.        Medications  Resume taking all of your medications unless your doctor or physician assistant tells you not to. If your incision is causing pain, you may take over-the- counter pain relievers such as acetaminophen (Tylenol). If you were prescribed a stronger pain medication, please be aware these medications can cause nausea and constipation. Prevent nausea by taking the medication with a snack or meal. Avoid constipation by drinking plenty of fluids and eating foods with a high amount of fiber, such as fruits, vegetables, and grains. Do not take Tylenol if you are taking prescription pain medications.  Follow Up  Our office will schedule a follow up appointment 2-3 weeks following discharge.  Please call us immediately for any of the following conditions  Increased pain, redness, drainage (pus) from your incision site. Fever of 101 degrees or higher. If you should develop stroke (slurred speech, difficulty swallowing, weakness on one side of your body, loss of vision) you should call 911 and go to the nearest emergency room.  Reduce your risk of vascular disease:  Stop smoking. If you would like help call [facility] at 1-800-QUIT-NOW (1-800-784-8669) or [place] at [phone]. Manage your cholesterol Maintain a desired weight Control your diabetes Keep your blood pressure down  If you have any questions, please call the office at [phone].   

## 2021-03-11 NOTE — Progress Notes (Signed)
HD#2 Subjective:  Overnight Events: No acute overnight events reported    Patient states he is sore in his neck and leg from the surgery but is otherwise feeling well. He states is throat is sore from the intubation and that is mouth is dry. He denies any new change in his vision, weakness, HA, SOB or CP. States he will be ready to go home as soon as he gets the go ahead.   Objective:  Vital signs in last 24 hours: Vitals:   03/11/21 0618 03/11/21 0804 03/11/21 0835 03/11/21 1127  BP: (!) 145/54 (!) 163/79  (!) 179/93  Pulse: 71 88  85  Resp: '13 18  16  '$ Temp: 98.3 F (36.8 C) 98.5 F (36.9 C)  97.7 F (36.5 C)  TempSrc: Oral Oral  Oral  SpO2: 92% 93% 95% 90%  Weight:      Height:       Supplemental O2: Nasal Cannula SpO2: 90 % O2 Flow Rate (L/min): 2 L/min   Physical Exam:  Constitutional: well-appearing male sitting in bed, in no acute distress HENT: normocephalic atraumatic Eyes: conjunctiva non-erythematous Neck: significant bruising at surgical site on L neck, no obvious sign of hematoma. Cardiovascular: regular rate, irregular rhythem, no m/r/g Pulmonary/Chest: normal work of breathing on room air, lungs clear to auscultation bilaterally Abdominal: soft, non-tender, non-distended MSK: normal bulk and tone Neurological: alert & oriented x 3  Filed Weights   03/09/21 1426 03/09/21 1606 03/10/21 2109  Weight: 103 kg 103 kg 100.8 kg     Intake/Output Summary (Last 24 hours) at 03/11/2021 1143 Last data filed at 03/11/2021 0836 Gross per 24 hour  Intake 2864.4 ml  Output 900 ml  Net 1964.4 ml   Net IO Since Admission: 2,014.4 mL [03/11/21 1143]  Pertinent Labs: CBC Latest Ref Rng & Units 03/11/2021 03/10/2021 03/09/2021  WBC 4.0 - 10.5 K/uL 14.8(H) 10.0 -  Hemoglobin 13.0 - 17.0 g/dL 12.6(L) 13.1 15.0  Hematocrit 39.0 - 52.0 % 36.6(L) 38.6(L) 44.0  Platelets 150 - 400 K/uL 189 202 -    CMP Latest Ref Rng & Units 03/11/2021 03/10/2021 03/09/2021  Glucose 70 - 99  mg/dL 113(H) 90 -  BUN 8 - 23 mg/dL 32(H) 32(H) -  Creatinine 0.61 - 1.24 mg/dL 2.49(H) 2.76(H) -  Sodium 135 - 145 mmol/L 135 134(L) -  Potassium 3.5 - 5.1 mmol/L 4.3 4.0 3.8  Chloride 98 - 111 mmol/L 108 104 -  CO2 22 - 32 mmol/L 18(L) 22 -  Calcium 8.9 - 10.3 mg/dL 8.7(L) 8.9 -  Total Protein 6.5 - 8.1 g/dL - - -  Total Bilirubin 0.3 - 1.2 mg/dL - - -  Alkaline Phos 38 - 126 U/L - - -  AST 15 - 41 U/L - - -  ALT 0 - 44 U/L - - -    Imaging: ECHOCARDIOGRAM COMPLETE  Result Date: 03/10/2021    ECHOCARDIOGRAM REPORT   Patient Name:   [name] Date of Exam: 03/10/2021 Medical Rec #:  [id number]      Height:       72.0 in Accession #:    [id number]     Weight:       227.1 lb Date of Birth:  25-Jul-1946     BSA:          2.249 m Patient Age:    75 years       BP:           147/64 mmHg  Patient Gender: M              HR:           54 bpm. Exam Location:  Inpatient Procedure: 2D Echo Indications:    Stroke  History:        Patient has prior history of Echocardiogram examinations, most                 recent 08/27/2019. Risk Factors:Hypertension and Dyslipidemia.  Sonographer:    Mikki [place] RDCS (AE) Referring Phys: [id number] [place]  1. Left ventricular ejection fraction, by estimation, is 55 to 60%. The left ventricle has normal function. The left ventricle has no regional wall motion abnormalities. There is mild left ventricular hypertrophy. Left ventricular diastolic parameters are consistent with Grade I diastolic dysfunction (impaired relaxation).  2. Right ventricular systolic function is normal. The right ventricular size is normal.  3. The mitral valve is grossly normal. Trivial mitral valve regurgitation.  4. The aortic valve is tricuspid. Aortic valve regurgitation is not visualized.  5. The inferior vena cava is normal in size with <50% respiratory variability, suggesting right atrial pressure of 8 mmHg. Comparison(s): No significant change from prior study.  08/27/2019: LVEF 55-60%. Conclusion(s)/Recommendation(s): No intracardiac source of embolism detected on this transthoracic study. A transesophageal echocardiogram is recommended to exclude cardiac source of embolism if clinically indicated. FINDINGS  Left Ventricle: Left ventricular ejection fraction, by estimation, is 55 to 60%. The left ventricle has normal function. The left ventricle has no regional wall motion abnormalities. The left ventricular internal cavity size was normal in size. There is  mild left ventricular hypertrophy. Left ventricular diastolic parameters are consistent with Grade I diastolic dysfunction (impaired relaxation). Indeterminate filling pressures. Right Ventricle: The right ventricular size is normal. No increase in right ventricular wall thickness. Right ventricular systolic function is normal. Left Atrium: Left atrial size was normal in size. Right Atrium: Right atrial size was normal in size. Pericardium: There is no evidence of pericardial effusion. Mitral Valve: The mitral valve is grossly normal. Trivial mitral valve regurgitation. Tricuspid Valve: The tricuspid valve is grossly normal. Tricuspid valve regurgitation is trivial. Aortic Valve: The aortic valve is tricuspid. Aortic valve regurgitation is not visualized. Pulmonic Valve: The pulmonic valve was normal in structure. Pulmonic valve regurgitation is not visualized. Aorta: The aortic root and ascending aorta are structurally normal, with no evidence of dilitation. Venous: The inferior vena cava is normal in size with less than 50% respiratory variability, suggesting right atrial pressure of 8 mmHg. IAS/Shunts: The interatrial septum was not well visualized.  LEFT VENTRICLE PLAX 2D LVIDd:         4.30 cm  Diastology LVIDs:         3.30 cm  LV e' medial:    5.11 cm/s LV PW:         1.10 cm  LV E/e' medial:  20.2 LV IVS:        1.10 cm  LV e' lateral:   8.81 cm/s LVOT diam:     2.50 cm  LV E/e' lateral: 11.7 LV SV:         112  LV SV Index:   50 LVOT Area:     4.91 cm  RIGHT VENTRICLE RV S prime:     13.30 cm/s TAPSE (M-mode): 1.8 cm LEFT ATRIUM             Index       RIGHT ATRIUM  Index LA diam:        4.50 cm 2.00 cm/m  RA Area:     18.30 cm LA Vol (A2C):   63.8 ml 28.37 ml/m RA Volume:   46.20 ml  20.55 ml/m LA Vol (A4C):   47.7 ml 21.21 ml/m LA Biplane Vol: 55.3 ml 24.59 ml/m  AORTIC VALVE LVOT Vmax:   101.00 cm/s LVOT Vmean:  67.800 cm/s LVOT VTI:    0.228 m  AORTA Ao Root diam: 3.00 cm MITRAL VALVE MV Area (PHT): 2.24 cm     SHUNTS MV Decel Time: 338 msec     Systemic VTI:  0.23 m MV E velocity: 103.25 cm/s  Systemic Diam: 2.50 cm MV A velocity: 122.00 cm/s MV E/A ratio:  0.85 [name] Bishop MD Electronically signed by [name] Bishop MD Signature Date/Time: 03/10/2021/3:25:08 PM    Final     Assessment/Plan:   Principal Problem:   Carotid artery thrombosis, left Active Problems:   CKD (chronic kidney disease), stage III (HCC)   HTN (hypertension)   Persistent atrial fibrillation (HCC)   Cerebellar infarction [facility])   Patient Summary: [name] Konrad Justiceis a 75 y.o.with pertinent PMH of CVA without deficit, s/p carotid endarterectomy 2007, carotid stent 2011, CKD stage 4, HTN, COPD, peripheral vascular disease, HFpEF, chronic Afib s/p ablationwho presented withtwo episodes amaurosis fugax of left eyeand admit for symptomatic left internal carotid artery stenosison hospital day 2, post-op day 1.    Symptomatic left internal carotid artery stenosis  Hx of CVA without deficit. Left carotid endarterectomy 2007, L carotid stent placement 2007, last carotid duplex 2020 w/o deficit. Patient had surgery yesterday that went well. He reports no new neurological deficit. Has some soreness at surgical site. Hgb this morning was 12.6, WBC 14.8 likely reactive from surgery. Cerebellar infarction  MRI with acute/subacute infarct of left cerebellar hemisphere. Stroke work-up advised as per neurology. Echo with EF of  55-60, no thrombus, no change from prior studies.  -Appreciate vascular surgery consult and recommendations -Appreciate neurology consult and recommendations  -Hold home Eliquis, plan to restart tomorrow as per vascular note  -Appreciate PT/OT consult and recommendations  -Continue rosuvastatin 20 mg qd -Restart home ASA  CKD stage IV Cr 2.90, BUN 33 on admission. Cr down to 2.49, BUN 32 on morning labs. Appears to be at baseline ~2.4.  Will monitor closely  -Daily renal panel   HTN Patients BP elevated at 160s-170s -Restart home amlodipine -Labetalol PRN for BP >180  Persistent Afib s/p ablation  HFpEF Patient followed by cardiology. Rate well controlled in Afib, a few SVT noted on telemetry. Appears euvolemic on exam.  -Restart Eliquis tomorrow as per above -Restart home Coreg  -Continuous telemetry   Diet: Heart Healthy IVF: None,None VTE: None Code: Full PT/OT recs: Pending, none.    Dispo: Anticipated discharge to Home in 1 days pending vascular go ahead.   [place] Student  Pager [phone] Please contact the on call pager after 5 pm and on weekends at [phone].

## 2021-03-11 NOTE — Progress Notes (Signed)
Pt had a 9 beat run of VT. Pt remains asymptomatic.

## 2021-03-11 NOTE — Progress Notes (Signed)
Pt received from PACU, AO x4. CHG bath completed, connected to tele and CCMD notified. A line to left radial intact, zeroed. Oriented pt to room and call bell system. Call bell within reach. Will continue to monitor.

## 2021-03-11 NOTE — Progress Notes (Signed)
Pt had a 21 beat run of SVTs. MD was notified. Orders placed for mag and phosphorus levels to be checked.

## 2021-03-11 NOTE — Plan of Care (Signed)
  Problem: Education: Goal: Knowledge of disease or condition will improve Outcome: Progressing Goal: Knowledge of secondary prevention will improve Outcome: Progressing Goal: Knowledge of patient specific risk factors addressed and post discharge goals established will improve Outcome: Progressing Goal: Individualized Educational Video(s) Outcome: Progressing   Problem: Nutrition: Goal: Dietary intake will improve Outcome: Progressing   Problem: Education: Goal: Knowledge of General Education information will improve Description: Including pain rating scale, medication(s)/side effects and non-pharmacologic comfort measures Outcome: Progressing   Problem: Health Behavior/Discharge Planning: Goal: Ability to manage health-related needs will improve Outcome: Progressing   Problem: Clinical Measurements: Goal: Ability to maintain clinical measurements within normal limits will improve Outcome: Progressing Goal: Will remain free from infection Outcome: Progressing Goal: Diagnostic test results will improve Outcome: Progressing Goal: Respiratory complications will improve Outcome: Progressing Goal: Cardiovascular complication will be avoided Outcome: Progressing   Problem: Activity: Goal: Risk for activity intolerance will decrease Outcome: Progressing   Problem: Nutrition: Goal: Adequate nutrition will be maintained Outcome: Progressing   Problem: Coping: Goal: Level of anxiety will decrease Outcome: Progressing   Problem: Elimination: Goal: Will not experience complications related to bowel motility Outcome: Progressing Goal: Will not experience complications related to urinary retention Outcome: Progressing   Problem: Pain Managment: Goal: General experience of comfort will improve Outcome: Progressing   Problem: Safety: Goal: Ability to remain free from injury will improve Outcome: Progressing   Problem: Skin Integrity: Goal: Risk for impaired skin  integrity will decrease Outcome: Progressing

## 2021-03-11 NOTE — Progress Notes (Signed)
Pt had a 19 beat run of VT. Pt remains asymptomatic.

## 2021-03-11 NOTE — Progress Notes (Addendum)
Pt's aline pressure has been elevated than his cuff pressure since he got on the floor. Aline pressure is 30-40 points off systolic. BP taken in both arms showed 151/70 in left and 155/82 on right respectively while Aline pressure is anywhere from systolic Q000111Q to 99991111 ([id number], 185/63, 190/66 flauctuating) . Aline wave is tall and peaked, zeroed multile times, drew back blood and flushed a-line but wave is still tall and peaked. Got in touch with PACU RN Chip , HE stated when pt first got out of OR his aline pressure was off 30 points and after that if was off 10-15 points. Got in touch with Rapid Response Nurse , he said a-line is showing falsely high reading and go with the cuff pressure. Pt resting in bed, Neuro intact. charge RN aware. Call bell within reach. Will continue to monitor.

## 2021-03-11 NOTE — Evaluation (Signed)
Physical Therapy Evaluation Patient Details Name: [name] MRN: [id number] DOB: Aug 18, 1946 Today's Date: 03/11/2021   History of Present Illness  75 y.o. 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  Underwent: Harvest of left leg great saphenous vein.  Redo left carotid endarterectomy with excision of carotid stents.  Ligation of left external carotid artery.  Left common carotid artery to internal carotid artery bypass with great saphenous vein on 4/6.  Clinical Impression  Pt admitted with/for carotid endarterectomy with common to internal carotid BPG.  Pt painful mostly at the L LE incision site, needing min guard assist at worst during basic mobility and gait. [name]  Pt currently limited functionally due to the problems listed below.  (see problems list.)  Pt will benefit from PT to maximize function and safety to be able to get home safely with available assist .     Follow Up Recommendations No PT follow up;Supervision - Intermittent    Equipment Recommendations  None recommended by PT;Other (comment) (pt/wife searching family for RW and 3 in 1.)    Recommendations for Other Services       Precautions / Restrictions Precautions Precautions: Fall      Mobility  Bed Mobility Overal bed mobility: Modified Independent                  Transfers Overall transfer level: Needs assistance Equipment used: Rolling walker (2 wheeled) Transfers: Sit to/from Stand Sit to Stand: Min guard         General transfer comment: cues for hand placement and guard during relative struggle to stand from lower surface due to L LE incisional pain.  Ambulation/Gait Ambulation/Gait assistance: Min guard Gait Distance (Feet): 35 Feet Assistive device: Rolling walker (2 wheeled) Gait Pattern/deviations: Step-through pattern   Gait velocity interpretation: <1.8 ft/sec, indicate of risk for recurrent falls General Gait Details: steady antalgic  gait with moderate use of the RW.  Stairs            Wheelchair Mobility    Modified Rankin (Stroke Patients Only)       Balance Overall balance assessment: Needs assistance Sitting-balance support: No upper extremity supported Sitting balance-Leahy Scale: Fair     Standing balance support: Bilateral upper extremity supported Standing balance-Leahy Scale: Poor Standing balance comment: needing RW                             Pertinent Vitals/Pain Pain Assessment: 0-10 Pain Score: 9  Pain Location: L leg incision Pain Descriptors / Indicators: Tender Pain Intervention(s): Monitored during session    Home Living Family/patient expects to be discharged to:: Private residence Living Arrangements: Spouse/significant other Available Help at Discharge: Family;Available 24 hours/day Type of Home: House Home Access: Stairs to enter   [employer] of Steps: 2 Home Layout: One level Home Equipment: Walker - 2 wheels;Shower seat;Bedside commode Additional Comments: Currently not using DME - his mother's    Prior Function Level of Independence: Independent               Hand Dominance   Dominant Hand: Right    Extremity/Trunk Assessment   Upper Extremity Assessment Upper Extremity Assessment: Defer to OT evaluation    Lower Extremity Assessment Lower Extremity Assessment: Overall WFL for tasks assessed;LLE deficits/detail LLE Deficits / Details: functional, but painful with flexion and w/bearing.    Cervical / Trunk Assessment Cervical / Trunk Assessment: Normal  Communication  Communication: No difficulties  Cognition Arousal/Alertness: Awake/alert Behavior During Therapy: WFL for tasks assessed/performed Overall Cognitive Status: Within Functional Limits for tasks assessed                                        General Comments General comments (skin integrity, edema, etc.): Mobility limited by asymptommatic  tachycardia/dysrhythmias and general irregular HR this afternoon.    Exercises     Assessment/Plan    PT Assessment Patient needs continued PT services  PT Problem List Decreased strength;Decreased activity tolerance;Decreased mobility;Decreased knowledge of use of DME;Cardiopulmonary status limiting activity;Pain       PT Treatment Interventions DME instruction;Gait training;Stair training;Functional mobility training;Therapeutic activities;Patient/family education    PT Goals (Current goals can be found in the Care Plan section)  Acute Rehab PT Goals Patient Stated Goal: Return home PT Goal Formulation: With patient Time For Goal Achievement: 03/18/21 Potential to Achieve Goals: Good    Frequency Min 3X/week   Barriers to discharge        Co-evaluation               AM-PAC PT "6 Clicks" Mobility  Outcome Measure Help needed turning from your back to your side while in a flat bed without using bedrails?: None Help needed moving from lying on your back to sitting on the side of a flat bed without using bedrails?: None Help needed moving to and from a bed to a chair (including a wheelchair)?: A Little Help needed standing up from a chair using your arms (e.g., wheelchair or bedside chair)?: A Little Help needed to walk in hospital room?: A Little Help needed climbing 3-5 steps with a railing? : A Little 6 Click Score: 20    End of Session   Activity Tolerance: Patient tolerated treatment well;Patient limited by pain Patient left: in bed;with call bell/phone within reach;with bed alarm set;with family/visitor present Nurse Communication: Mobility status PT Visit Diagnosis: Other abnormalities of gait and mobility (R26.89);Difficulty in walking, not elsewhere classified (R26.2)    Time: [id number] PT Time Calculation (min) (ACUTE ONLY): 26 min   Charges:   PT Evaluation $PT Eval Low Complexity: 1 Low PT Treatments $Gait Training: 8-22 mins         03/11/2021  [name]., PT Acute Rehabilitation Services [phone]  (pager) [phone]  (office)  [name] [name] 03/11/2021, 5:24 PM

## 2021-03-11 NOTE — Progress Notes (Addendum)
Pt had another 21 beat run of VT. Notified MD x2. No new orders. Pt remains asymptomatic.

## 2021-03-11 NOTE — Evaluation (Signed)
Occupational Therapy Evaluation Patient Details Name: [name] MRN: [id number] DOB: 22-Mar-1946 Today's Date: 03/11/2021    History of Present Illness 75 y.o. 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  Underwent: Harvest of left leg great saphenous vein.  Redo left carotid endarterectomy with excision of carotid stents.  Ligation of left external carotid artery.  Left common carotid artery to internal carotid artery bypass with great saphenous vein on 4/6.   Clinical Impression   Patient admitted for the above diagnosis and subsequent procedure.  PTA he lives with his wife, who is able to assist as needed, and was independent with mobility and self care.  He still drives, and is able to mange his own meds and bill payment.  Barriers are listed below.  Currently he is needing up to [place] for mobility and lower body ADL.  OT anticipates a quick recovery as surgical pain relents, and his dizziness resolves.  OT to follow in the acute setting to maximize function and ensure a safe transition home.  No home OT needs are anticipated.      Follow Up Recommendations  No OT follow up    Equipment Recommendations  None recommended by OT    Recommendations for Other Services       Precautions / Restrictions Precautions Precautions: Fall Precaution Comments: Dizziness with mobility Restrictions Weight Bearing Restrictions: No      Mobility Bed Mobility Overal bed mobility: Modified Independent                  Transfers Overall transfer level: Needs assistance Equipment used: Rolling walker (2 wheeled) Transfers: Sit to/from [employer] Sit to Stand: Min guard Stand pivot transfers: Min guard       General transfer comment: dizziness with changes in position.  BP tracked: 171/93 sitting EOB, and 154/101 after transfer.    Balance Overall balance assessment: Needs assistance Sitting-balance support: Single extremity  supported;Feet supported Sitting balance-Leahy Scale: Fair     Standing balance support: Bilateral upper extremity supported Standing balance-Leahy Scale: Poor Standing balance comment: needing RW                           ADL either performed or assessed with clinical judgement   ADL Overall ADL's : Needs assistance/impaired Eating/Feeding: Independent;Sitting   Grooming: Set up;Sitting           Upper Body Dressing : Set up;Sitting   Lower Body Dressing: Minimal assistance;Sit to/from stand               Functional mobility during ADLs: Min guard;Rolling walker       Vision Baseline Vision/History: Wears glasses Patient Visual Report: No change from baseline Additional Comments: TIA symptoms have resolved     Perception     Praxis      Pertinent Vitals/Pain Pain Assessment: Faces Faces Pain Scale: Hurts a little bit Pain Location: L leg incision Pain Descriptors / Indicators: Tender Pain Intervention(s): Monitored during session     Hand Dominance Right   Extremity/Trunk Assessment Upper Extremity Assessment Upper Extremity Assessment: Overall WFL for tasks assessed   Lower Extremity Assessment Lower Extremity Assessment: Defer to PT evaluation   Cervical / Trunk Assessment Cervical / Trunk Assessment: Normal   Communication     Cognition Arousal/Alertness: Awake/alert Behavior During Therapy: WFL for tasks assessed/performed Overall Cognitive Status: Within Functional Limits for tasks assessed  Home Living Family/patient expects to be discharged to:: Private residence Living Arrangements: Spouse/significant other Available Help at Discharge: Family;Available 24 hours/day Type of Home: House       Home Layout: One level     Bathroom Shower/Tub: Occupational psychologist: Standard     Home Equipment: Environmental consultant - 2 wheels;Shower seat;Bedside  commode   Additional Comments: Currently not using DME - his mother's      Prior Functioning/Environment Level of Independence: Independent                 OT Problem List: Decreased activity tolerance;Impaired balance (sitting and/or standing);Pain      OT Treatment/Interventions: Self-care/ADL training;Energy conservation;Therapeutic activities;Balance training    OT Goals(Current goals can be found in the care plan section) Acute Rehab OT Goals Patient Stated Goal: Return home OT Goal Formulation: With patient Time For Goal Achievement: 03/25/21 Potential to Achieve Goals: Good ADL Goals Pt Will Perform Grooming: with modified independence;sitting;standing Pt Will Perform Lower Body Bathing: with modified independence;sit to/from stand Pt Will Perform Lower Body Dressing: with modified independence;sit to/from stand Pt Will Transfer to Toilet: with modified independence;ambulating;regular height toilet  OT Frequency: Min 2X/week   Barriers to D/C:    none noted       Co-evaluation              AM-PAC OT "6 Clicks" Daily Activity     Outcome Measure Help from another person eating meals?: None Help from another person taking care of personal grooming?: None Help from another person toileting, which includes using toliet, bedpan, or urinal?: A Little Help from another person bathing (including washing, rinsing, drying)?: A Little Help from another person to put on and taking off regular upper body clothing?: None Help from another person to put on and taking off regular lower body clothing?: A Little 6 Click Score: 21   End of Session Equipment Utilized During Treatment: Rolling walker Nurse Communication: Mobility status  Activity Tolerance: Patient tolerated treatment well Patient left: in bed;with call bell/phone within reach  OT Visit Diagnosis: Unsteadiness on feet (R26.81);Dizziness and giddiness (R42);Pain Pain - Right/Left: Left Pain - part of  body: Leg                Time: [id number] OT Time Calculation (min): 19 min Charges:  OT General Charges $OT Visit: 1 Visit OT Evaluation $OT Eval Moderate Complexity: 1 Mod  03/11/2021  Rich, OTR/L  Acute Rehabilitation Services  Office:  [place] 03/11/2021, 11:47 AM

## 2021-03-11 NOTE — Progress Notes (Addendum)
  Progress Note    03/11/2021 7:45 AM 1 Day Post-Op  Subjective:  No further vision changes since surgery   Vitals:   03/11/21 0518 03/11/21 0618  BP: (!) 164/72 (!) 145/54  Pulse:  71  Resp:  13  Temp:  98.3 F (36.8 C)  SpO2:  92%   Physical Exam Lungs:  Non labored Incisions:  L neck incision with ecchymosis but soft; L LE vein harvest incision c/d/i Extremities:  Moving all extremities well Neurologic: CN grossly intact  CBC    Component Value Date/Time   WBC 14.8 (H) 03/11/2021 0442   RBC 3.95 (L) 03/11/2021 0442   HGB 12.6 (L) 03/11/2021 0442   HGB 14.2 10/29/2019 0918   HCT 36.6 (L) 03/11/2021 0442   HCT 41.1 10/29/2019 0918   PLT 189 03/11/2021 0442   PLT 168 10/29/2019 0918   MCV 92.7 03/11/2021 0442   MCV 89 10/29/2019 0918   MCH 31.9 03/11/2021 0442   MCHC 34.4 03/11/2021 0442   RDW 14.5 03/11/2021 0442   RDW 12.3 10/29/2019 0918   LYMPHSABS 2.8 03/09/2021 1415   MONOABS 1.0 03/09/2021 1415   EOSABS 0.5 03/09/2021 1415   BASOSABS 0.1 03/09/2021 1415    BMET    Component Value Date/Time   NA 135 03/11/2021 0441   NA 140 03/31/2020 1244   K 4.3 03/11/2021 0441   CL 108 03/11/2021 0441   CO2 18 (L) 03/11/2021 0441   GLUCOSE 113 (H) 03/11/2021 0441   BUN 32 (H) 03/11/2021 0441   BUN 24 03/31/2020 1244   CREATININE 2.49 (H) 03/11/2021 0441   CALCIUM 8.7 (L) 03/11/2021 0441   GFRNONAA 26 (L) 03/11/2021 0441   GFRAA 33 (L) 03/31/2020 1244    INR    Component Value Date/Time   INR 1.2 03/09/2021 1415     Intake/Output Summary (Last 24 hours) at 03/11/2021 0745 Last data filed at 03/11/2021 0540 Gross per 24 hour  Intake 2624.4 ml  Output 900 ml  Net 1724.4 ml     Assessment/Plan:  75 y.o. male is s/p L CCA to ICA bypass with L saphenous vein 1 Day Post-Op   No neuro events overnight; no further vision changes L neck incision with local ecchymosis but soft Ok for discharge today if tolerating breakfast, ambulating without difficulty,  and if able to void since foley removal   Dagoberto Ligas, PA-C [facility] [phone] 03/11/2021 7:45 AM   I have seen and evaluated the patient. I agree with the PA note as documented above.  Postop day 1 status post redo left carotid endarterectomy with common carotid to internal carotid artery bypass using saphenous vein after excision of his carotid stents.  He is neuro intact this morning.  His neck has a fair amount of ecchymosis but is soft with no significant hemaomta.  Overall he looks good.  I would continue to hold his heparin until tomorrow given risk for surgical bleeding.  Heparin was for A. fib.   Marty Heck, MD [facility] [place] Office: [phone]

## 2021-03-11 NOTE — Anesthesia Postprocedure Evaluation (Signed)
Anesthesia Post Note  Patient: [name]  Procedure(s) Performed: REDO LEFT CAROTID ENDARTERECTOMY WITH LEFT COMMON TO INTERNAL CAROTID ARTERY BYPASS (Left ) VEIN HARVEST FROM LEFT SAPHENOUS VEIN (Left Leg Upper) LIGATION OF LEFT EXTERNAL CAROTID (Left Neck)     Patient location during evaluation: PACU Anesthesia Type: General Level of consciousness: awake and alert Pain management: pain level controlled Vital Signs Assessment: post-procedure vital signs reviewed and stable Respiratory status: spontaneous breathing, nonlabored ventilation, respiratory function stable and patient connected to nasal cannula oxygen Cardiovascular status: blood pressure returned to baseline and stable Postop Assessment: no apparent nausea or vomiting Anesthetic complications: no   No complications documented.  Last Vitals:  Vitals:   03/11/21 0518 03/11/21 0618  BP: (!) 164/72 (!) 145/54  Pulse:  71  Resp:  13  Temp:  36.8 C  SpO2:  92%    Last Pain:  Vitals:   03/11/21 0618  TempSrc: Oral  PainSc:                  [place]

## 2021-03-11 NOTE — Progress Notes (Signed)
STROKE TEAM PROGRESS NOTE   SUBJECTIVE (INTERVAL HISTORY) RN is at the bedside.  Patient awake alert, neurologically intact.  No vision changes.  Had carotid surgery yesterday, doing well.  Overnight and this morning had several episode of nonsustained V. tach, currently on magnesium infusion.  On aspirin 81, per vascular surgery, will hold off heparin IV for another day.  OBJECTIVE Temp:  [97.7 F (36.5 C)-98.5 F (36.9 C)] 98.1 F (36.7 C) (04/07 1521) Pulse Rate:  [64-88] 83 (04/07 1521) Cardiac Rhythm: Normal sinus rhythm (04/07 0741) Resp:  [10-23] 16 (04/07 1521) BP: (119-179)/(54-99) 157/65 (04/07 1521) SpO2:  [90 %-98 %] 95 % (04/07 1416) Arterial Line BP: (145-199)/(47-78) 157/49 (04/07 0618) Weight:  [100.8 kg] 100.8 kg (04/06 2109)  Recent Labs  Lab 03/09/21 1421  GLUCAP 101*   Recent Labs  Lab 03/09/21 1415 03/09/21 1427 03/09/21 2135 03/10/21 0255 03/11/21 0441 03/11/21 1358  NA 133* 134*  --  134* 135  --   K 4.5 4.4 3.8 4.0 4.3  --   CL 102 104  --  104 108  --   CO2 21*  --   --  22 18*  --   GLUCOSE 106* 102*  --  90 113*  --   BUN 33* 40*  --  32* 32*  --   CREATININE 2.90* 3.00*  --  2.76* 2.49*  --   CALCIUM 8.9  --   --  8.9 8.7*  --   MG  --   --  1.5* 2.2  --  1.8  PHOS  --   --  3.8  --   --  4.6   Recent Labs  Lab 03/09/21 1415  AST 24  ALT 16  ALKPHOS 73  BILITOT 0.6  PROT 6.0*  ALBUMIN 3.0*   Recent Labs  Lab 03/09/21 1415 03/09/21 1427 03/10/21 0255 03/11/21 0442  WBC 12.4*  --  10.0 14.8*  NEUTROABS 8.1*  --   --   --   HGB 14.4 15.0 13.1 12.6*  HCT 43.4 44.0 38.6* 36.6*  MCV 93.5  --  91.9 92.7  PLT 204  --  202 189   No results for input(s): CKTOTAL, CKMB, CKMBINDEX, TROPONINI in the last 168 hours. Recent Labs    03/09/21 1415  LABPROT 14.9  INR 1.2   Recent Labs    03/09/21 2157  COLORURINE YELLOW  LABSPEC 1.018  PHURINE 6.0  GLUCOSEU 50*  HGBUR SMALL*  BILIRUBINUR NEGATIVE  KETONESUR NEGATIVE   PROTEINUR 100*  NITRITE NEGATIVE  LEUKOCYTESUR NEGATIVE       Component Value Date/Time   CHOL 145 03/10/2021 0254   TRIG 126 03/10/2021 0254   HDL 33 (L) 03/10/2021 0254   CHOLHDL 4.4 03/10/2021 0254   VLDL 25 03/10/2021 0254   LDLCALC 87 03/10/2021 0254   Lab Results  Component Value Date   HGBA1C 6.1 (H) 03/10/2021      Component Value Date/Time   LABOPIA NONE DETECTED 03/09/2021 2157   COCAINSCRNUR NONE DETECTED 03/09/2021 2157   LABBENZ NONE DETECTED 03/09/2021 2157   AMPHETMU NONE DETECTED 03/09/2021 2157   THCU NONE DETECTED 03/09/2021 2157   LABBARB NONE DETECTED 03/09/2021 2157    Recent Labs  Lab 03/09/21 1415  ETH <10    I have personally reviewed the radiological images below and agree with the radiology interpretations.  CT ANGIO HEAD W OR WO CONTRAST  Result Date: 03/09/2021 CLINICAL DATA:  Acute neuro deficit. Left eye monocular  blindness, resolved. History of left carotid stent 2020 EXAM: CT ANGIOGRAPHY HEAD AND NECK TECHNIQUE: Multidetector CT imaging of the head and neck was performed using the standard protocol during bolus administration of intravenous contrast. Multiplanar CT image reconstructions and MIPs were obtained to evaluate the vascular anatomy. Carotid stenosis measurements (when applicable) are obtained utilizing NASCET criteria, using the distal internal carotid diameter as the denominator. CONTRAST:  51m OMNIPAQUE IOHEXOL 350 MG/ML SOLN COMPARISON:  CT head 03/09/2021 FINDINGS: CTA NECK FINDINGS Aortic arch: Atherosclerotic calcification aortic arch without aneurysm. Left vertebral artery origin from the arch with a moderate stenosis at the origin. Right carotid system: Mild atherosclerotic disease in the right common carotid artery and right carotid bifurcation without significant stenosis. Left carotid system: Stent across the left carotid bifurcation. There is a filling defect within the lumen of the stent distally likely due to thrombus or  atherosclerotic plaque. This is causing significant narrowing of the lumen however there is opacification of the internal carotid artery above the stent. There is a moderate stenosis of the origin of the left external carotid artery. Vertebral arteries: Mild atherosclerotic disease proximal right vertebral artery without stenosis. Right vertebral artery patent to the basilar Left vertebral origin from the arch with a moderate stenosis at the origin. Left vertebral artery is then patent to the basilar without additional stenosis. Skeleton: Cervical spondylosis.  No acute skeletal abnormality. Other neck: Negative for mass or adenopathy Upper chest: Lung apices clear bilaterally. Review of the MIP images confirms the above findings CTA HEAD FINDINGS Anterior circulation: Mild atherosclerotic calcification in the cavernous carotid bilaterally without stenosis. Anterior cerebral arteries patent bilaterally. Right middle cerebral artery widely patent Decreased caliber of the left middle cerebral artery compared to the right likely due to hypoperfusion. No focal stenosis or large vessel occlusion. Posterior circulation: Both vertebral arteries patent to the basilar. PICA patent bilaterally. Basilar widely patent. AICA, superior cerebellar, and posterior cerebral arteries patent without stenosis or large vessel occlusion. Venous sinuses: Normal venous enhancement. Right transverse sinus dominant. Small left transverse sinus. Anatomic variants: None Review of the MIP images confirms the above findings IMPRESSION: 1. Left carotid stenting across the bifurcation. The stent is patent however there is filling defect within the distal stent which may be due to acute thrombus or atherosclerotic plaque. This appears to be causing significant stenosis. 2. Small left middle cerebral artery compared to the right likely due to hypoperfusion. No intracranial large vessel occlusion 3. Atherosclerotic aorta. Mild atherosclerotic disease  right carotid bifurcation. 4. Moderate stenosis left vertebral artery which has origin from the arch. 5. These results were called by telephone at the time of interpretation on 03/09/2021 at 2:49 pm to [facility], who verbally acknowledged these results. Electronically Signed   By: CFranchot GalloM.D.   On: 03/09/2021 14:51   CT ANGIO NECK W OR WO CONTRAST  Result Date: 03/09/2021 CLINICAL DATA:  Acute neuro deficit. Left eye monocular blindness, resolved. History of left carotid stent 2020 EXAM: CT ANGIOGRAPHY HEAD AND NECK TECHNIQUE: Multidetector CT imaging of the head and neck was performed using the standard protocol during bolus administration of intravenous contrast. Multiplanar CT image reconstructions and MIPs were obtained to evaluate the vascular anatomy. Carotid stenosis measurements (when applicable) are obtained utilizing NASCET criteria, using the distal internal carotid diameter as the denominator. CONTRAST:  760mOMNIPAQUE IOHEXOL 350 MG/ML SOLN COMPARISON:  CT head 03/09/2021 FINDINGS: CTA NECK FINDINGS Aortic arch: Atherosclerotic calcification aortic arch without aneurysm. Left vertebral  artery origin from the arch with a moderate stenosis at the origin. Right carotid system: Mild atherosclerotic disease in the right common carotid artery and right carotid bifurcation without significant stenosis. Left carotid system: Stent across the left carotid bifurcation. There is a filling defect within the lumen of the stent distally likely due to thrombus or atherosclerotic plaque. This is causing significant narrowing of the lumen however there is opacification of the internal carotid artery above the stent. There is a moderate stenosis of the origin of the left external carotid artery. Vertebral arteries: Mild atherosclerotic disease proximal right vertebral artery without stenosis. Right vertebral artery patent to the basilar Left vertebral origin from the arch with a moderate stenosis at  the origin. Left vertebral artery is then patent to the basilar without additional stenosis. Skeleton: Cervical spondylosis.  No acute skeletal abnormality. Other neck: Negative for mass or adenopathy Upper chest: Lung apices clear bilaterally. Review of the MIP images confirms the above findings CTA HEAD FINDINGS Anterior circulation: Mild atherosclerotic calcification in the cavernous carotid bilaterally without stenosis. Anterior cerebral arteries patent bilaterally. Right middle cerebral artery widely patent Decreased caliber of the left middle cerebral artery compared to the right likely due to hypoperfusion. No focal stenosis or large vessel occlusion. Posterior circulation: Both vertebral arteries patent to the basilar. PICA patent bilaterally. Basilar widely patent. AICA, superior cerebellar, and posterior cerebral arteries patent without stenosis or large vessel occlusion. Venous sinuses: Normal venous enhancement. Right transverse sinus dominant. Small left transverse sinus. Anatomic variants: None Review of the MIP images confirms the above findings IMPRESSION: 1. Left carotid stenting across the bifurcation. The stent is patent however there is filling defect within the distal stent which may be due to acute thrombus or atherosclerotic plaque. This appears to be causing significant stenosis. 2. Small left middle cerebral artery compared to the right likely due to hypoperfusion. No intracranial large vessel occlusion 3. Atherosclerotic aorta. Mild atherosclerotic disease right carotid bifurcation. 4. Moderate stenosis left vertebral artery which has origin from the arch. 5. These results were called by telephone at the time of interpretation on 03/09/2021 at 2:49 pm to provider [facility] , who verbally acknowledged these results. Electronically Signed   By: Franchot Gallo M.D.   On: 03/09/2021 14:51   MR BRAIN WO CONTRAST  Result Date: 03/09/2021 CLINICAL DATA:  Neuro deficit, acute, stroke suspected.  EXAM: MRI HEAD WITHOUT CONTRAST TECHNIQUE: Multiplanar, multiecho pulse sequences of the brain and surrounding structures were obtained without intravenous contrast. COMPARISON:  Head CT March 09, 2021. FINDINGS: Brain: Small focus of restricted diffusion within the left cerebellar hemisphere consistent with acute/subacute infarct. Remote lacunar infarct in the right corona radiata. Scattered foci of T2 hyperintensity within the white matter of the cerebral hemispheres, nonspecific, most likely related to chronic small vessel ischemia. No hemorrhage, hydrocephalus, extra-axial collection or mass lesion. Vascular: Normal flow voids. Skull and upper cervical spine: Normal marrow signal. Sinuses/Orbits: Negative. Other: Left mastoid effusion. IMPRESSION: 1. Small acute/subacute infarct within the left cerebellar hemisphere. 2. Remote lacunar infarct in the right corona radiata. 3. Mild chronic small vessel ischemia. 4. Left mastoid effusion. These results were called by secure text paging at the time of interpretation on 03/09/2021 at 3:55 pm to provider [place] . Electronically Signed   By: Pedro Earls M.D.   On: 03/09/2021 15:57   ECHOCARDIOGRAM COMPLETE  Result Date: 03/10/2021    ECHOCARDIOGRAM REPORT   Patient Name:   JATERRION WUNDERLICH Date of  Exam: 03/10/2021 Medical Rec #:  [id number]      Height:       72.0 in Accession #:    [id number]     Weight:       227.1 lb Date of Birth:  [birth year]     BSA:          2.249 m Patient Age:    26 years       BP:           147/64 mmHg Patient Gender: M              HR:           54 bpm. Exam Location:  Inpatient Procedure: 2D Echo Indications:    Stroke  History:        Patient has prior history of Echocardiogram examinations, most                 recent 08/27/2019. Risk Factors:Hypertension and Dyslipidemia.  Sonographer:    Mikki Santee RDCS (AE) Referring Phys: [id number] [place]  1. Left ventricular ejection fraction, by  estimation, is 55 to 60%. The left ventricle has normal function. The left ventricle has no regional wall motion abnormalities. There is mild left ventricular hypertrophy. Left ventricular diastolic parameters are consistent with Grade I diastolic dysfunction (impaired relaxation).  2. Right ventricular systolic function is normal. The right ventricular size is normal.  3. The mitral valve is grossly normal. Trivial mitral valve regurgitation.  4. The aortic valve is tricuspid. Aortic valve regurgitation is not visualized.  5. The inferior vena cava is normal in size with <50% respiratory variability, suggesting right atrial pressure of 8 mmHg. Comparison(s): No significant change from prior study. 08/27/2019: LVEF 55-60%. Conclusion(s)/Recommendation(s): No intracardiac source of embolism detected on this transthoracic study. A transesophageal echocardiogram is recommended to exclude cardiac source of embolism if clinically indicated. FINDINGS  Left Ventricle: Left ventricular ejection fraction, by estimation, is 55 to 60%. The left ventricle has normal function. The left ventricle has no regional wall motion abnormalities. The left ventricular internal cavity size was normal in size. There is  mild left ventricular hypertrophy. Left ventricular diastolic parameters are consistent with Grade I diastolic dysfunction (impaired relaxation). Indeterminate filling pressures. Right Ventricle: The right ventricular size is normal. No increase in right ventricular wall thickness. Right ventricular systolic function is normal. Left Atrium: Left atrial size was normal in size. Right Atrium: Right atrial size was normal in size. Pericardium: There is no evidence of pericardial effusion. Mitral Valve: The mitral valve is grossly normal. Trivial mitral valve regurgitation. Tricuspid Valve: The tricuspid valve is grossly normal. Tricuspid valve regurgitation is trivial. Aortic Valve: The aortic valve is tricuspid. Aortic valve  regurgitation is not visualized. Pulmonic Valve: The pulmonic valve was normal in structure. Pulmonic valve regurgitation is not visualized. Aorta: The aortic root and ascending aorta are structurally normal, with no evidence of dilitation. Venous: The inferior vena cava is normal in size with less than 50% respiratory variability, suggesting right atrial pressure of 8 mmHg. IAS/Shunts: The interatrial septum was not well visualized.  LEFT VENTRICLE PLAX 2D LVIDd:         4.30 cm  Diastology LVIDs:         3.30 cm  LV e' medial:    5.11 cm/s LV PW:         1.10 cm  LV E/e' medial:  20.2 LV IVS:        1.10 cm  LV e' lateral:   8.81 cm/s LVOT diam:     2.50 cm  LV E/e' lateral: 11.7 LV SV:         112 LV SV Index:   50 LVOT Area:     4.91 cm  RIGHT VENTRICLE RV S prime:     13.30 cm/s TAPSE (M-mode): 1.8 cm LEFT ATRIUM             Index       RIGHT ATRIUM           Index LA diam:        4.50 cm 2.00 cm/m  RA Area:     18.30 cm LA Vol (A2C):   63.8 ml 28.37 ml/m RA Volume:   46.20 ml  20.55 ml/m LA Vol (A4C):   47.7 ml 21.21 ml/m LA Biplane Vol: 55.3 ml 24.59 ml/m  AORTIC VALVE LVOT Vmax:   101.00 cm/s LVOT Vmean:  67.800 cm/s LVOT VTI:    0.228 m  AORTA Ao Root diam: 3.00 cm MITRAL VALVE MV Area (PHT): 2.24 cm     SHUNTS MV Decel Time: 338 msec     Systemic VTI:  0.23 m MV E velocity: 103.25 cm/s  Systemic Diam: 2.50 cm MV A velocity: 122.00 cm/s MV E/A ratio:  0.85 Lyman Bishop MD Electronically signed by Lyman Bishop MD Signature Date/Time: 03/10/2021/3:25:08 PM    Final    CT HEAD CODE STROKE WO CONTRAST  Result Date: 03/09/2021 CLINICAL DATA:  Code stroke. Acute neuro deficit. Vision problem left eye. EXAM: CT HEAD WITHOUT CONTRAST TECHNIQUE: Contiguous axial images were obtained from the base of the skull through the vertex without intravenous contrast. COMPARISON:  None. FINDINGS: Brain: No evidence of acute infarction, hemorrhage, hydrocephalus, extra-axial collection or mass lesion/mass effect.  Vascular: Negative for hyperdense vessel Skull: Negative Sinuses/Orbits: Negative Other: None ASPECTS ([place] Stroke Program Early CT Score) - Ganglionic level infarction (caudate, lentiform nuclei, internal capsule, insula, M1-M3 cortex): 7 - Supraganglionic infarction (M4-M6 cortex): 3 Total score (0-10 with 10 being normal): 10 IMPRESSION: 1. No acute intracranial abnormality 2. ASPECTS is 10 3. Code stroke imaging results were communicated on 03/09/2021 at 2:36 pm to provider Rory Percy via text page Electronically Signed   By: Franchot Gallo M.D.   On: 03/09/2021 14:37    PHYSICAL EXAM  Temp:  [97.7 F (36.5 C)-98.5 F (36.9 C)] 98.1 F (36.7 C) (04/07 1521) Pulse Rate:  [64-88] 83 (04/07 1521) Resp:  [10-23] 16 (04/07 1521) BP: (119-179)/(54-99) 157/65 (04/07 1521) SpO2:  [90 %-98 %] 95 % (04/07 1416) Arterial Line BP: (145-199)/(47-78) 157/49 (04/07 0618) Weight:  [100.8 kg] 100.8 kg (04/06 2109)  General - Well nourished, well developed, in no apparent distress.  Ophthalmologic - fundi not visualized due to noncooperation.  Cardiovascular - Regular rhythm and rate.  Mental Status -  Level of arousal and orientation to time, place, and person were intact.  Left neck incision wound dry and clean. Language including expression, naming, repetition, comprehension was assessed and found intact. Fund of Knowledge was assessed and was intact.  Cranial Nerves II - XII - II - Visual field intact OU. III, IV, VI - Extraocular movements intact. V - Facial sensation intact bilaterally. VII - Facial movement intact bilaterally. VIII - Hearing & vestibular intact bilaterally. X - Palate elevates symmetrically. XI - Chin turning & shoulder shrug intact bilaterally. XII - Tongue protrusion intact.  Motor Strength - The patient's strength was normal in all extremities  and pronator drift was absent.  Bulk was normal and fasciculations were absent.   Motor Tone - Muscle tone was assessed at the  neck and appendages and was normal.  Reflexes - The patient's reflexes were symmetrical in all extremities and he had no pathological reflexes.  Sensory - Light touch, temperature/pinprick were assessed and were symmetrical.    Coordination - The patient had normal movements in the hands with no ataxia or dysmetria.  Tremor was absent.  Gait and Station - deferred.   ASSESSMENT/PLAN Mr. OZZIE HINO is a 75 y.o. male with history of CKD stage III, hypertension, hyperlipidemia, PAD, A. fib on Eliquis, left CEA 2007 followed by stenting 2011 and 2020  admitted for episodes of left vision loss x2. No tPA given due to symptom resolved.    Amaurosis fugax Carotid stenosis, left  Transient episodes of left vision loss  History of left CEA 2007 followed by stenting 2011 and 2020  CT head and neck filling defect at distal left ICA stent with focal stenosis.  Left VA moderate stenosis at the origin.  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 Large vessel disease source due to left VA origin stenosis  MRI left small/punctate cerebellar infarct  CT head and neck filling defect at distal left ICA stent with focal stenosis.  Left VA moderate stenosis at the origin.  2D Echo EF 55 to 60%  LDL 87  HgbA1c 6.1  Heparin IV for VTE prophylaxis  aspirin 81 mg daily and Eliquis (apixaban) daily prior to admission, now on ASA 81. Currently anticoagulation on hold post surgery. Plan to resume eliquis on discharge.   Ongoing aggressive stroke risk factor management  Therapy recommendations: none  Disposition: Pending  Chronic A. Fib  On Eliquis 5 mg twice daily PTA  Currently off AC due to post surgery  Rate controlled  Plan to resume Eliquis on discharge  Unsustained V. Tach  Asymptomatic  Several episodes overnight and in am  Magnesium  1.5->2.2->1.8  On Mg infusion  Management per primary team  Hypertension . Stable on the high end . 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 Please call with questions. Pt will follow up with stroke clinic NP at [facility] in about 4 weeks. Thanks for the consult.   Rosalin Hawking, MD PhD Stroke Neurology 03/11/2021 6:33 PM    To contact Stroke Continuity provider, please refer to [email or web address] After hours, contact General Neurology

## 2021-03-11 NOTE — Progress Notes (Signed)
Foley cath removed per order, pt was anxious asked for pain medication before cath removal. tolerated fairly well. Did void approx 20 cc right after cath came out. Call bell within reach. Will continue to monitor.

## 2021-03-12 LAB — CBC
HCT: 35.1 % — ABNORMAL LOW (ref 39.0–52.0)
Hemoglobin: 11.7 g/dL — ABNORMAL LOW (ref 13.0–17.0)
MCH: 31.4 pg (ref 26.0–34.0)
MCHC: 33.3 g/dL (ref 30.0–36.0)
MCV: 94.1 fL (ref 80.0–100.0)
Platelets: 189 10*3/uL (ref 150–400)
RBC: 3.73 MIL/uL — ABNORMAL LOW (ref 4.22–5.81)
RDW: 14.9 % (ref 11.5–15.5)
WBC: 15.7 10*3/uL — ABNORMAL HIGH (ref 4.0–10.5)
nRBC: 0 % (ref 0.0–0.2)

## 2021-03-12 LAB — BASIC METABOLIC PANEL
Anion gap: 10 (ref 5–15)
BUN: 36 mg/dL — ABNORMAL HIGH (ref 8–23)
CO2: 20 mmol/L — ABNORMAL LOW (ref 22–32)
Calcium: 8.9 mg/dL (ref 8.9–10.3)
Chloride: 106 mmol/L (ref 98–111)
Creatinine, Ser: 2.63 mg/dL — ABNORMAL HIGH (ref 0.61–1.24)
GFR, Estimated: 24 mL/min — ABNORMAL LOW (ref >60)
Glucose, Bld: 89 mg/dL (ref 70–99)
Potassium: 4.2 mmol/L (ref 3.5–5.1)
Sodium: 136 mmol/L (ref 135–145)

## 2021-03-12 MED ORDER — APIXABAN 5 MG PO TABS
5.0000 mg | ORAL_TABLET | Freq: Two times a day (BID) | ORAL | Status: DC
  Administered 2021-03-12: 5 mg via ORAL
  Filled 2021-03-12: qty 1

## 2021-03-12 MED ORDER — ROSUVASTATIN CALCIUM 20 MG PO TABS: 20.0000 mg | ORAL_TABLET | Freq: Every evening | ORAL | 0 refills | Status: DC

## 2021-03-12 NOTE — Discharge Summary (Signed)
Name: [name] MRN: [id number] DOB: 1946-03-04 75 y.o. PCP: Raelene Bott, MD  Date of Admission: 03/09/2021  2:03 PM Date of Discharge: 03/12/2021 Attending Physician: Dr. Angelia Mould  Discharge Diagnosis: Principal Problem:   Carotid artery thrombosis, left Active Problems:   CKD (chronic kidney disease), stage III (HCC)   HTN (hypertension)   Persistent atrial fibrillation ([place])   Cerebellar infarction [facility])    Discharge Medications: Allergies as of 03/12/2021      Reactions   Plavix [clopidogrel] Rash      Medication List    TAKE these medications   albuterol 108 (90 Base) MCG/ACT inhaler Commonly known as: VENTOLIN HFA Inhale 2 puffs into the lungs every 6 (six) hours as needed (wheezing/shortness of breath).   allopurinol 300 MG tablet Commonly known as: ZYLOPRIM Take 300 mg by mouth daily.   amLODipine 10 MG tablet Commonly known as: NORVASC Take 10 mg by mouth daily.   aspirin EC 81 MG tablet Take 1 tablet (81 mg total) by mouth daily. What changed: when to take this   azelastine 0.1 % nasal spray Commonly known as: ASTELIN Place 1-2 sprays into both nostrils 2 (two) times daily as needed for rhinitis. Use in each nostril as directed   carvedilol 12.5 MG tablet Commonly known as: COREG Take 1 tablet (12.5 mg total) by mouth 2 (two) times daily.   Eliquis 5 MG Tabs tablet Generic drug: apixaban TAKE 1 TABLET(5 MG) BY MOUTH TWICE DAILY What changed: See the new instructions.   furosemide 40 MG tablet Commonly known as: LASIX Take 40 mg by mouth as needed for fluid (swelling).   gabapentin 300 MG capsule Commonly known as: NEURONTIN Take 300 mg by mouth 2 (two) times daily.   losartan-hydrochlorothiazide 50-12.5 MG tablet Commonly known as: HYZAAR Take 1 tablet by mouth daily.   omeprazole 20 MG capsule Commonly known as: PRILOSEC Take 20 mg by mouth daily with breakfast.   oxyCODONE-acetaminophen 7.5-325 MG tablet Commonly known as:  PERCOCET Take 1 tablet by mouth every 6 (six) hours as needed for severe pain. What changed:   how much to take  when to take this   potassium chloride 10 MEQ tablet Commonly known as: KLOR-CON TAKE 1 TABLET(10 MEQ) BY MOUTH DAILY What changed: See the new instructions.   rosuvastatin 20 MG tablet Commonly known as: CRESTOR Take 1 tablet (20 mg total) by mouth every evening. What changed:   medication strength  how much to take   Trelegy Ellipta 100-62.5-25 MCG/INH Aepb Generic drug: Fluticasone-Umeclidin-Vilant Inhale 1 puff into the lungs at bedtime.            Discharge Care Instructions  (From admission, onward)         Start     Ordered   03/12/21 0000  Discharge wound care:       Comments: Per wound care RN   03/12/21 1321          Disposition and follow-up:   Mr.[name] was discharged from [facility] in Good condition.  At the hospital follow up visit please address:  1.  Follow-up:  A. Patient had repeated episodes of unsustained Vtach while in hospital treated with IV MG. Will require cardiology follow-up and monitoring of electrolytes.     B. Follow-up with stroke clinic in ~4 weeks.    C. Follow-up with vascular   D. Continue to encourage smoking cessation  2.  Labs / imaging needed at time  of follow-up: Magnesium, CBC, CMP  4.  Medication Changes  Started: rosuvastatin 20 mg   Follow-up Appointments:  Follow-up Information    [facility] Follow up in 3 week(s).   Specialty: Vascular Surgery Contact information: [address] [place] [phone]       [facility]. Schedule an appointment as soon as possible for a visit in 4 week(s).   Specialty: Neurology Contact information: [address] [place]       Raelene Bott, MD. Call.   Specialty: Internal Medicine Contact  information: [place] [address] [phone]        Sherren Mocha, MD .   Specialty: Cardiology Contact information: [id number] [place] [address] [phone]               Hospital Course by problem list:    Symptomatic left internal carotid artery stenosis  Hx of CVA without deficit. Left carotid endarterectomy 2007, L carotid stent placement 2007, last carotid duplex 2020 w/o deficit. Patient presented with two episodes on amaurosis fugax, which prompted code stroke. MRI brain with acute/subacute infarct of left cerebellar hemisphere. CTA of chest and neck was significant for narrowing of L carotid artery, thrombus vs atherosclerosis. Patient without neurologic deficit. Patient was promptly evaluated by neurology and vascular surgery and patient was scheduled for surgery for redo of L carotid endartoectomy and removal of stent the following day. Patient had surgery on 03/10/2021 without incident or deficit. Had some soreness at surgical site, but otherwise felt well after surgery.  Cerebellar infarction   Stroke work-up was advised per neurology. Echo with EF of 55-60, no thrombus, no change from prior studies.  ASA was started and home Eliquis was held day after surgery due to post surgical bleeding risk, but ultimately restarted today.  CKD stage IV Cr 2.90, BUN 33 on admission. Cr down to 2.63  BUN 36 on discharge. Appears to be mildly above baseline of ~2.4. Patient will require follow up with nephrologist. HTN Patients BP elevated 160s-170s on admission. Patient on permissive HTN systolic [id number]. Stayed in that range through admission and patients amlodipine was ultimately restarted.  -Restart home amlodipine   Persistent Afib s/p ablation  HFpEF Patient followed by cardiology. Rate was largely well controlled, however there was a period of ~40 beats non-sustained Vtach vs SVT his first night. Patient found to  have magnesium of 1.5 and was treated with 2 g Mag. Yesterday nursing contacted team with additional periods of non-sustained V-tach vs SVT. Mag was checked and found to be 1.8, treated with 1 g Mag. Patient had minimal episodes overnight. Patient remained asymtomatic throughout stay. Patient advised that if he should ever feel chest pain/pressure to seat medical care immediately.  Patient remained euvolemic during stay. Home Coreg and Eliquis have been restarted.   Discharge Subjective: Patient states he is feeling well. Still has some discomfort at surgical site, but otherwise denies change in vision, weakness, chest pain and ShOB. Patient counciled on importance of smoking cessation. Patient states that PT/OT went well and he was able to get up and walk around. States it is challenging to come to [place] to meet with all the specialists. He is excited to go home.   Discharge Exam:   BP 109/85 (BP Location: Left Arm)   Pulse 64   Temp (!) 97.5 F (36.4 C) (Oral)   Resp  18   Ht '6\' 1"'$  (1.854 m)   Wt 100.8 kg   SpO2 93%   BMI 29.32 kg/m  Constitutional: well-appearing male sitting in recliner, in no acute distress HENT: normocephalic atraumatic, mucous membranes moist Eyes: conjunctiva non-erythematous Neck: supple, bruising around surgical site on left neck, no signs of hematoma.  Cardiovascular: regular rate and irregular rhythm, no m/r/g Pulmonary/Chest: normal work of breathing on room air, lungs clear to auscultation bilaterally Abdominal: soft, non-tender, non-distended MSK: normal bulk and tone Neurological: alert & oriented x 3   Pertinent Labs, Studies, and Procedures:  CBC Latest Ref Rng & Units 03/12/2021 03/11/2021 03/10/2021  WBC 4.0 - 10.5 K/uL 15.7(H) 14.8(H) 10.0  Hemoglobin 13.0 - 17.0 g/dL 11.7(L) 12.6(L) 13.1  Hematocrit 39.0 - 52.0 % 35.1(L) 36.6(L) 38.6(L)  Platelets 150 - 400 K/uL 189 189 202    CMP Latest Ref Rng & Units 03/12/2021 03/11/2021 03/10/2021  Glucose 70 -  99 mg/dL 89 113(H) 90  BUN 8 - 23 mg/dL 36(H) 32(H) 32(H)  Creatinine 0.61 - 1.24 mg/dL 2.63(H) 2.49(H) 2.76(H)  Sodium 135 - 145 mmol/L 136 135 134(L)  Potassium 3.5 - 5.1 mmol/L 4.2 4.3 4.0  Chloride 98 - 111 mmol/L 106 108 104  CO2 22 - 32 mmol/L 20(L) 18(L) 22  Calcium 8.9 - 10.3 mg/dL 8.9 8.7(L) 8.9  Total Protein 6.5 - 8.1 g/dL - - -  Total Bilirubin 0.3 - 1.2 mg/dL - - -  Alkaline Phos 38 - 126 U/L - - -  AST 15 - 41 U/L - - -  ALT 0 - 44 U/L - - -    CT ANGIO HEAD W OR WO CONTRAST  Result Date: 03/09/2021 CLINICAL DATA:  Acute neuro deficit. Left eye monocular blindness, resolved. History of left carotid stent 2020 EXAM: CT ANGIOGRAPHY HEAD AND NECK TECHNIQUE: Multidetector CT imaging of the head and neck was performed using the standard protocol during bolus administration of intravenous contrast. Multiplanar CT image reconstructions and MIPs were obtained to evaluate the vascular anatomy. Carotid stenosis measurements (when applicable) are obtained utilizing NASCET criteria, using the distal internal carotid diameter as the denominator. CONTRAST:  35m OMNIPAQUE IOHEXOL 350 MG/ML SOLN COMPARISON:  CT head 03/09/2021 FINDINGS: CTA NECK FINDINGS Aortic arch: Atherosclerotic calcification aortic arch without aneurysm. Left vertebral artery origin from the arch with a moderate stenosis at the origin. Right carotid system: Mild atherosclerotic disease in the right common carotid artery and right carotid bifurcation without significant stenosis. Left carotid system: Stent across the left carotid bifurcation. There is a filling defect within the lumen of the stent distally likely due to thrombus or atherosclerotic plaque. This is causing significant narrowing of the lumen however there is opacification of the internal carotid artery above the stent. There is a moderate stenosis of the origin of the left external carotid artery. Vertebral arteries: Mild atherosclerotic disease proximal right  vertebral artery without stenosis. Right vertebral artery patent to the basilar Left vertebral origin from the arch with a moderate stenosis at the origin. Left vertebral artery is then patent to the basilar without additional stenosis. Skeleton: Cervical spondylosis.  No acute skeletal abnormality. Other neck: Negative for mass or adenopathy Upper chest: Lung apices clear bilaterally. Review of the MIP images confirms the above findings CTA HEAD FINDINGS Anterior circulation: Mild atherosclerotic calcification in the cavernous carotid bilaterally without stenosis. Anterior cerebral arteries patent bilaterally. Right middle cerebral artery widely patent Decreased caliber of the left middle cerebral artery compared to the right  likely due to hypoperfusion. No focal stenosis or large vessel occlusion. Posterior circulation: Both vertebral arteries patent to the basilar. PICA patent bilaterally. Basilar widely patent. AICA, superior cerebellar, and posterior cerebral arteries patent without stenosis or large vessel occlusion. Venous sinuses: Normal venous enhancement. Right transverse sinus dominant. Small left transverse sinus. Anatomic variants: None Review of the MIP images confirms the above findings IMPRESSION: 1. Left carotid stenting across the bifurcation. The stent is patent however there is filling defect within the distal stent which may be due to acute thrombus or atherosclerotic plaque. This appears to be causing significant stenosis. 2. Small left middle cerebral artery compared to the right likely due to hypoperfusion. No intracranial large vessel occlusion 3. Atherosclerotic aorta. Mild atherosclerotic disease right carotid bifurcation. 4. Moderate stenosis left vertebral artery which has origin from the arch. 5. These results were called by telephone at the time of interpretation on 03/09/2021 at 2:49 pm to provider [facility] , who verbally acknowledged these results. Electronically Signed   By:  Franchot Gallo M.D.   On: 03/09/2021 14:51   CT ANGIO NECK W OR WO CONTRAST  Result Date: 03/09/2021 CLINICAL DATA:  Acute neuro deficit. Left eye monocular blindness, resolved. History of left carotid stent 2020 EXAM: CT ANGIOGRAPHY HEAD AND NECK TECHNIQUE: Multidetector CT imaging of the head and neck was performed using the standard protocol during bolus administration of intravenous contrast. Multiplanar CT image reconstructions and MIPs were obtained to evaluate the vascular anatomy. Carotid stenosis measurements (when applicable) are obtained utilizing NASCET criteria, using the distal internal carotid diameter as the denominator. CONTRAST:  14m OMNIPAQUE IOHEXOL 350 MG/ML SOLN COMPARISON:  CT head 03/09/2021 FINDINGS: CTA NECK FINDINGS Aortic arch: Atherosclerotic calcification aortic arch without aneurysm. Left vertebral artery origin from the arch with a moderate stenosis at the origin. Right carotid system: Mild atherosclerotic disease in the right common carotid artery and right carotid bifurcation without significant stenosis. Left carotid system: Stent across the left carotid bifurcation. There is a filling defect within the lumen of the stent distally likely due to thrombus or atherosclerotic plaque. This is causing significant narrowing of the lumen however there is opacification of the internal carotid artery above the stent. There is a moderate stenosis of the origin of the left external carotid artery. Vertebral arteries: Mild atherosclerotic disease proximal right vertebral artery without stenosis. Right vertebral artery patent to the basilar Left vertebral origin from the arch with a moderate stenosis at the origin. Left vertebral artery is then patent to the basilar without additional stenosis. Skeleton: Cervical spondylosis.  No acute skeletal abnormality. Other neck: Negative for mass or adenopathy Upper chest: Lung apices clear bilaterally. Review of the MIP images confirms the above  findings CTA HEAD FINDINGS Anterior circulation: Mild atherosclerotic calcification in the cavernous carotid bilaterally without stenosis. Anterior cerebral arteries patent bilaterally. Right middle cerebral artery widely patent Decreased caliber of the left middle cerebral artery compared to the right likely due to hypoperfusion. No focal stenosis or large vessel occlusion. Posterior circulation: Both vertebral arteries patent to the basilar. PICA patent bilaterally. Basilar widely patent. AICA, superior cerebellar, and posterior cerebral arteries patent without stenosis or large vessel occlusion. Venous sinuses: Normal venous enhancement. Right transverse sinus dominant. Small left transverse sinus. Anatomic variants: None Review of the MIP images confirms the above findings IMPRESSION: 1. Left carotid stenting across the bifurcation. The stent is patent however there is filling defect within the distal stent which may be due to acute thrombus or  atherosclerotic plaque. This appears to be causing significant stenosis. 2. Small left middle cerebral artery compared to the right likely due to hypoperfusion. No intracranial large vessel occlusion 3. Atherosclerotic aorta. Mild atherosclerotic disease right carotid bifurcation. 4. Moderate stenosis left vertebral artery which has origin from the arch. 5. These results were called by telephone at the time of interpretation on 03/09/2021 at 2:49 pm to provider [facility] , who verbally acknowledged these results. Electronically Signed   By: Franchot Gallo M.D.   On: 03/09/2021 14:51   MR BRAIN WO CONTRAST  Result Date: 03/09/2021 CLINICAL DATA:  Neuro deficit, acute, stroke suspected. EXAM: MRI HEAD WITHOUT CONTRAST TECHNIQUE: Multiplanar, multiecho pulse sequences of the brain and surrounding structures were obtained without intravenous contrast. COMPARISON:  Head CT March 09, 2021. FINDINGS: Brain: Small focus of restricted diffusion within the left cerebellar  hemisphere consistent with acute/subacute infarct. Remote lacunar infarct in the right corona radiata. Scattered foci of T2 hyperintensity within the white matter of the cerebral hemispheres, nonspecific, most likely related to chronic small vessel ischemia. No hemorrhage, hydrocephalus, extra-axial collection or mass lesion. Vascular: Normal flow voids. Skull and upper cervical spine: Normal marrow signal. Sinuses/Orbits: Negative. Other: Left mastoid effusion. IMPRESSION: 1. Small acute/subacute infarct within the left cerebellar hemisphere. 2. Remote lacunar infarct in the right corona radiata. 3. Mild chronic small vessel ischemia. 4. Left mastoid effusion. These results were called by secure text paging at the time of interpretation on 03/09/2021 at 3:55 pm to provider [place] . Electronically Signed   By: Pedro Earls M.D.   On: 03/09/2021 15:57   ECHOCARDIOGRAM COMPLETE  Result Date: 03/10/2021    ECHOCARDIOGRAM REPORT   Patient Name:   OSTEN JACOX Date of Exam: 03/10/2021 Medical Rec #:  [id number]      Height:       72.0 in Accession #:    [id number]     Weight:       227.1 lb Date of Birth:  1946-02-13     BSA:          2.249 m Patient Age:    68 years       BP:           147/64 mmHg Patient Gender: M              HR:           54 bpm. Exam Location:  Inpatient Procedure: 2D Echo Indications:    Stroke  History:        Patient has prior history of Echocardiogram examinations, most                 recent 08/27/2019. Risk Factors:Hypertension and Dyslipidemia.  Sonographer:    Mikki Santee RDCS (AE) Referring Phys: [id number] [place]  1. Left ventricular ejection fraction, by estimation, is 55 to 60%. The left ventricle has normal function. The left ventricle has no regional wall motion abnormalities. There is mild left ventricular hypertrophy. Left ventricular diastolic parameters are consistent with Grade I diastolic dysfunction (impaired relaxation).  2. Right  ventricular systolic function is normal. The right ventricular size is normal.  3. The mitral valve is grossly normal. Trivial mitral valve regurgitation.  4. The aortic valve is tricuspid. Aortic valve regurgitation is not visualized.  5. The inferior vena cava is normal in size with <50% respiratory variability, suggesting right atrial pressure of 8 mmHg. Comparison(s): No significant change from prior study. 08/27/2019: LVEF  55-60%. Conclusion(s)/Recommendation(s): No intracardiac source of embolism detected on this transthoracic study. A transesophageal echocardiogram is recommended to exclude cardiac source of embolism if clinically indicated. FINDINGS  Left Ventricle: Left ventricular ejection fraction, by estimation, is 55 to 60%. The left ventricle has normal function. The left ventricle has no regional wall motion abnormalities. The left ventricular internal cavity size was normal in size. There is  mild left ventricular hypertrophy. Left ventricular diastolic parameters are consistent with Grade I diastolic dysfunction (impaired relaxation). Indeterminate filling pressures. Right Ventricle: The right ventricular size is normal. No increase in right ventricular wall thickness. Right ventricular systolic function is normal. Left Atrium: Left atrial size was normal in size. Right Atrium: Right atrial size was normal in size. Pericardium: There is no evidence of pericardial effusion. Mitral Valve: The mitral valve is grossly normal. Trivial mitral valve regurgitation. Tricuspid Valve: The tricuspid valve is grossly normal. Tricuspid valve regurgitation is trivial. Aortic Valve: The aortic valve is tricuspid. Aortic valve regurgitation is not visualized. Pulmonic Valve: The pulmonic valve was normal in structure. Pulmonic valve regurgitation is not visualized. Aorta: The aortic root and ascending aorta are structurally normal, with no evidence of dilitation. Venous: The inferior vena cava is normal in size with  less than 50% respiratory variability, suggesting right atrial pressure of 8 mmHg. IAS/Shunts: The interatrial septum was not well visualized.  LEFT VENTRICLE PLAX 2D LVIDd:         4.30 cm  Diastology LVIDs:         3.30 cm  LV e' medial:    5.11 cm/s LV PW:         1.10 cm  LV E/e' medial:  20.2 LV IVS:        1.10 cm  LV e' lateral:   8.81 cm/s LVOT diam:     2.50 cm  LV E/e' lateral: 11.7 LV SV:         112 LV SV Index:   50 LVOT Area:     4.91 cm  RIGHT VENTRICLE RV S prime:     13.30 cm/s TAPSE (M-mode): 1.8 cm LEFT ATRIUM             Index       RIGHT ATRIUM           Index LA diam:        4.50 cm 2.00 cm/m  RA Area:     18.30 cm LA Vol (A2C):   63.8 ml 28.37 ml/m RA Volume:   46.20 ml  20.55 ml/m LA Vol (A4C):   47.7 ml 21.21 ml/m LA Biplane Vol: 55.3 ml 24.59 ml/m  AORTIC VALVE LVOT Vmax:   101.00 cm/s LVOT Vmean:  67.800 cm/s LVOT VTI:    0.228 m  AORTA Ao Root diam: 3.00 cm MITRAL VALVE MV Area (PHT): 2.24 cm     SHUNTS MV Decel Time: 338 msec     Systemic VTI:  0.23 m MV E velocity: 103.25 cm/s  Systemic Diam: 2.50 cm MV A velocity: 122.00 cm/s MV E/A ratio:  0.85 Lyman Bishop MD Electronically signed by Lyman Bishop MD Signature Date/Time: 03/10/2021/3:25:08 PM    Final    CT HEAD CODE STROKE WO CONTRAST  Result Date: 03/09/2021 CLINICAL DATA:  Code stroke. Acute neuro deficit. Vision problem left eye. EXAM: CT HEAD WITHOUT CONTRAST TECHNIQUE: Contiguous axial images were obtained from the base of the skull through the vertex without intravenous contrast. COMPARISON:  None. FINDINGS: Brain: No evidence of  acute infarction, hemorrhage, hydrocephalus, extra-axial collection or mass lesion/mass effect. Vascular: Negative for hyperdense vessel Skull: Negative Sinuses/Orbits: Negative Other: None ASPECTS ([place] Stroke Program Early CT Score) - Ganglionic level infarction (caudate, lentiform nuclei, internal capsule, insula, M1-M3 cortex): 7 - Supraganglionic infarction (M4-M6 cortex): 3 Total  score (0-10 with 10 being normal): 10 IMPRESSION: 1. No acute intracranial abnormality 2. ASPECTS is 10 3. Code stroke imaging results were communicated on 03/09/2021 at 2:36 pm to provider Rory Percy via text page Electronically Signed   By: Franchot Gallo M.D.   On: 03/09/2021 14:37     Discharge Instructions: Discharge Instructions    Ambulatory referral to Neurology   Complete by: As directed    Follow up with stroke clinic NP (Jessica Vanschaick or Cecille Rubin, if both not available, consider Zachery Dauer, or [place]) at [facility] in about 4 weeks. Thanks.   Call MD for:  severe uncontrolled pain   Complete by: As directed    Call MD for:  temperature >100.4   Complete by: As directed    Diet - low sodium heart healthy   Complete by: As directed    Discharge instructions   Complete by: As directed    Mr. Nedd,  It is a pleasure taking care of you during this admission.  You were hospitalized for a clot in the left carotid artery.  You underwent surgery to replace your left carotid artery with saphenous vein in your leg.  Please follow-up with the vascular surgeon in 3 weeks.  Their phone number should be in the discharge paperwork.  We also found a small stroke in the left cerebellar hemisphere, which is unlikely to be the cause of your intermittent blindness.  Please follow-up with the neurologist in 4 weeks.  Your cholesterol medication dosage was changed from Crestor 10 mg to 20 mg daily.  Please also follow-up with your primary care physician and cardiologist.   Take care,  Dr. Alfonse Spruce   Discharge wound care:   Complete by: As directed    Per wound care RN   Increase activity slowly   Complete by: As directed       Signed: Claudie Revering, Medical Student 03/12/2021, 3:39 PM   Pager: [phone]

## 2021-03-12 NOTE — Progress Notes (Addendum)
[facility] [place]  Subjective  - No neurologic complaints, doing well over all.   Objective (!) 168/75 64 97.7 F (36.5 C) (Oral) 19 90%  Intake/Output Summary (Last 24 hours) at 03/12/2021 0752 Last data filed at 03/12/2021 0418 Gross per 24 hour  Intake 832.4 ml  Output 1200 ml  Net -367.6 ml    No tongue deviation and no facial droop Left neck incision significant ecchymosis, soft with healing ridge Left medial vein harvest site healing well Lungs non labored breathing Moving all 4 ext.  Assessment/Planning: POD # 2 Procedure: 1.  Harvest of left leg great saphenous vein 2.  Redo left carotid endarterectomy with excision of carotid stents  3.  Ligation of left external carotid artery 4.  Left common carotid artery to internal carotid artery bypass with great saphenous vein  No neurologic deficits Motor and sensation intact all 4 ext. Eliquis has been ordered to restart today Stable left ICA bypass   Roxy Horseman 03/12/2021 7:52 AM --  Laboratory Lab Results: Recent Labs    03/10/21 0255 03/11/21 0442  WBC 10.0 14.8*  HGB 13.1 12.6*  HCT 38.6* 36.6*  PLT 202 189   BMET Recent Labs    03/10/21 0255 03/11/21 0441  NA 134* 135  K 4.0 4.3  CL 104 108  CO2 22 18*  GLUCOSE 90 113*  BUN 32* 32*  CREATININE 2.76* 2.49*  CALCIUM 8.9 8.7*    COAG Lab Results  Component Value Date   INR 1.2 03/09/2021   INR 1.01 05/31/2012   INR 0.90 09/09/2010   No results found for: PTT   I have seen and evaluated the patient. I agree with the PA note as documented above.  75 year old male postop day 2 status post redo left carotid endarterectomy with excision of carotid stents and left common carotid to internal carotid artery bypass for symptomatic recurrent stenosis in stent with TIA.  He is neurologically intact.  Moving everything this morning.  He does have a lot of ecchymosis on his neck but this is all soft.  I'd be okay  restarting his Eliquis for his A. fib which is chronic.  I will arrange follow-up in my office in about 3 weeks for incision check.  He is on a 1 mg aspirin from my standpoint.  Marty Heck, MD [facility] [place] Office: [phone]

## 2021-03-12 NOTE — Progress Notes (Signed)
Physical Therapy Treatment Patient Details Name: [name] MRN: [id number] DOB: 1946-04-12 Today's Date: 03/12/2021    History of Present Illness 75 y.o. 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  Underwent: Harvest of left leg great saphenous vein.  Redo left carotid endarterectomy with excision of carotid stents.  Ligation of left external carotid artery.  Left common carotid artery to internal carotid artery bypass with great saphenous vein on 4/6.    PT Comments    Pt progressing well toward goals.  Emphasis on progressing gait stability and discussion of progression of gait/activity post d/c.    Follow Up Recommendations  No PT follow up;Supervision - Intermittent     Equipment Recommendations  None recommended by PT    Recommendations for Other Services       Precautions / Restrictions Precautions Precautions: Fall    Mobility  Bed Mobility Overal bed mobility: Modified Independent                  Transfers Overall transfer level: Needs assistance Equipment used: Rolling walker (2 wheeled) Transfers: Sit to/from Stand Sit to Stand: Min guard         General transfer comment: cues for hand placement and guard during relative struggle to stand from lower surface due to L LE incisional pain.  Ambulation/Gait Ambulation/Gait assistance: Supervision Gait Distance (Feet): 130 Feet Assistive device: Rolling walker (2 wheeled) Gait Pattern/deviations: Step-through pattern   Gait velocity interpretation: <1.8 ft/sec, indicate of risk for recurrent falls General Gait Details: stable less antalgic gait, lighter use of the RW, no overt coordination issue, deviations noted.   Stairs             Wheelchair Mobility    Modified Rankin (Stroke Patients Only)       Balance Overall balance assessment: Needs assistance   Sitting balance-Leahy Scale: Good     Standing balance support: Bilateral upper  extremity supported;No upper extremity supported Standing balance-Leahy Scale: Fair Standing balance comment: statically able to stand without AD, still preferring to use AD during gait.                            Cognition Arousal/Alertness: Awake/alert Behavior During Therapy: WFL for tasks assessed/performed Overall Cognitive Status: Within Functional Limits for tasks assessed                                        Exercises      General Comments General comments (skin integrity, edema, etc.): vss overall, HR irregular, but not tachy today.      Pertinent Vitals/Pain Pain Assessment: Faces Faces Pain Scale: Hurts little more Pain Location: L leg incision Pain Descriptors / Indicators: Discomfort Pain Intervention(s): Monitored during session    Home Living                      Prior Function            PT Goals (current goals can now be found in the care plan section) Acute Rehab PT Goals Patient Stated Goal: Return home PT Goal Formulation: With patient Time For Goal Achievement: 03/18/21 Potential to Achieve Goals: Good Progress towards PT goals: Progressing toward goals    Frequency    Min 3X/week      PT Plan Current plan remains appropriate  Co-evaluation              AM-PAC PT "6 Clicks" Mobility   Outcome Measure  Help needed turning from your back to your side while in a flat bed without using bedrails?: None Help needed moving from lying on your back to sitting on the side of a flat bed without using bedrails?: None Help needed moving to and from a bed to a chair (including a wheelchair)?: A Little Help needed standing up from a chair using your arms (e.g., wheelchair or bedside chair)?: A Little Help needed to walk in hospital room?: A Little Help needed climbing 3-5 steps with a railing? : A Little 6 Click Score: 20    End of Session   Activity Tolerance: Patient tolerated treatment  well Patient left: in chair;with call bell/phone within reach Nurse Communication: Mobility status PT Visit Diagnosis: Other abnormalities of gait and mobility (R26.89);Difficulty in walking, not elsewhere classified (R26.2)     Time: [id number] PT Time Calculation (min) (ACUTE ONLY): 17 min  Charges:  $Gait Training: 8-22 mins                     03/12/2021  Ginger Carne., PT Acute Rehabilitation Services [phone]  (pager) [phone]  (office)   Tessie Fass Areil Ottey 03/12/2021, 10:46 AM

## 2021-03-12 NOTE — Progress Notes (Signed)
Mobility Specialist - Progress Note   03/12/21 1333  Mobility  Activity Ambulated in hall  Level of Assistance Standby assist, set-up cues, supervision of patient - no hands on  Assistive Device Front wheel walker  Distance Ambulated (ft) 130 ft  Mobility Response Tolerated well  Mobility performed by Mobility specialist  $Mobility charge 1 Mobility   Pre-mobility: 72 HR, 94% SpO2 During mobility: 91 HR Post-mobility: 78 HR, 95% SpO2  Pt required one standing rest break due to minor dizziness, resolved quickly. Pt sitting up on edge of bed after walk.   Pricilla Handler Mobility Specialist Mobility Specialist Phone: [phone]

## 2021-03-15 ENCOUNTER — Encounter: Payer: Self-pay | Admitting: *Deleted

## 2021-03-15 ENCOUNTER — Other Ambulatory Visit: Payer: Self-pay | Admitting: *Deleted

## 2021-03-15 DIAGNOSIS — M1990 Unspecified osteoarthritis, unspecified site: Secondary | ICD-10-CM | POA: Insufficient documentation

## 2021-03-15 NOTE — Patient Outreach (Signed)
[facility]) Care Management  03/15/2021  [name] May 28, 1946 [id number]   [facility] outreach for EMMI stroke  RED ON EMMI ALERT Day #    1       Date:  Sunday 03/14/21 1000 Red Alert Reason: Filled new prescriptions? No Problems setting up rehab? Didn't Need Any  Insurance: medicare [facility] admissions x 1  ED visits x 1 in the last 6 months  03/09/21 admission TIA  And recurrent left carotid artery stent critical stenosis= harvest of left leg Redo left carotid endarterectomy with excision of carotid stents.  Ligation of left external carotid artery.  Left common carotid artery to internal carotid artery bypass with great saphenous vein on 4/6.  Outreach attempt # 1  [name] is able to verify HIPAA identifiers [name]  Consent: [name] RN CM reviewed [facility] services with [name]. [name] gave verbal consent for services [facility] telephonic RN CM.   Advised [name] that there will be further automated EMMI- post discharge calls to assess how the [name] is doing following the recent hospitalization Advised the [name] that another call may be received from a nurse if any of their responses were abnormal. [name] voiced understanding and was appreciative of f/u call.    EMMI:   [name] reports he is doing much better since being discharged home He reports his only residual issue is his sore throat after the endotracheal tube use   H reports being hunger but not eating too much He reports it takes 20 minutes to eat sandwich in small bites He was encouraged to take in liquids and soft foods until his surgeon or primary care provider (PCP) are able to evaluate him. He was cautioned to seek medical attention if worsens  He voices understanding but reports tolerable and he is to follow up with his pcp on 03/16/21 Remains sore at surgery site   Denies care coordination and disease management needs   Social: Lives at  home with wife [name] who assists with care and transportation   Past Medical History:  Diagnosis Date  . CKD (chronic kidney disease), stage III ([place])   . Dyspnea on exertion   . Edema   . HTN (hypertension)   . Hyperlipidemia   . Left carotid stenosis    a. 2007 s/p L CEA;  b. 2011 s/p L common carotid stenting 2/2 restenosis;  c. [id number] u/s: RICA [id number], LICA [id number], patent LCCA stent.  [name] PAD (peripheral artery disease) (HCC)    a. s/p prior RSFA, REIA, distal LSFA (known to be occluded), LEIA, and LCIA stenting;  b. 01/2015 ABI's: R 0.78, L 0.53.  [name] Stroke [facility])    [name] Active Problem List   Diagnosis Date Noted  . Osteoarthritis 03/15/2021  . 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 Cerebellar infarction ([place]) 03/09/2021  . 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 Peripheral neurogenic pain 11/10/2020  . Paroxysmal atrial fibrillation ([place]) 03/19/2020  . AKI (acute kidney injury) ([place]) 03/19/2020  . Anticoagulant long-term use 03/19/2020  . Elevated troponin 03/19/2020  . Left carotid artery stenosis 08/02/2019  . Sciatica of right side 11/08/2016  . Controlled substance agreement signed 09/22/2015  . Dyspnea on exertion   . HTN (hypertension)   . Left carotid stenosis   . Influenza vaccination declined by [name] 09/16/2014  . Screening for depression 04/08/2014  . Chronic pain 08/21/2013  . Peripheral vascular disease ([place]) 06/17/2013  .  Anorectal polyp 06/17/2013  . Calcaneal spur 06/17/2013  . Diverticulosis of colon 06/17/2013  . Hypoxemia 06/17/2013  . Inguinal hernia with obstruction unilateral 06/17/2013  . Internal hemorrhoids 06/17/2013  . Pain in joint involving ankle and foot 06/17/2013  . Pneumonia of right lung due to infectious organism 06/17/2013  . Transient ischemic attack (TIA), and cerebral infarction without residual deficits(V12.54) 06/17/2013  . CKD (chronic kidney disease) stage 4, GFR 15-29 ml/min (HCC) 05/15/2013  .  Hyperlipidemia 05/15/2013  . Acid reflux 05/15/2013  . History of CVA (cerebrovascular accident) 05/15/2013  . Pulmonary emphysema ([place]) 05/15/2013  . Tobacco use disorder 02/05/2013  . Occlusion and stenosis of carotid artery without mention of cerebral infarction 03/06/2012  . HYPERTENSION, BENIGN 03/12/2009  . Atherosclerosis of native arteries of extremity with intermittent claudication ([place]) 03/12/2009   Current everyday smoker   DME: RW 3 n1 glasses  Medications: denies concerns with taking medications as prescribed, affording medications, side effects of medications and questions about medications    Appointments: pcp on 03/16/21    Plan: 03/15/21 referral states not on APL, not eligible for further [facility] services  Case closure Pt sent successful outreach letter   [place]. Lavina Hamman, RN, BSN, [place] Coordinator Office number [phone] Mobile number [phone]  Main [name] number [phone] Fax number [phone]

## 2021-03-23 ENCOUNTER — Telehealth: Payer: Self-pay

## 2021-03-23 NOTE — Telephone Encounter (Signed)
Pt's wife [name] called to let us know pt's groin incision is red/painful/swollen. He saw his MD in [facility] today who said fluid accumulation about the size of a 1/2 grapefruit. Pt has been scheduled to see PA in office tomorrow. [name] verbalized understanding.

## 2021-03-24 ENCOUNTER — Ambulatory Visit (INDEPENDENT_AMBULATORY_CARE_PROVIDER_SITE_OTHER): Payer: Medicare Other | Admitting: Physician Assistant

## 2021-03-24 ENCOUNTER — Other Ambulatory Visit: Payer: Self-pay

## 2021-03-24 VITALS — BP 119/66 | HR 84 | Temp 97.7°F | Resp 20 | Ht 73.0 in | Wt 226.1 lb

## 2021-03-24 DIAGNOSIS — I6522 Occlusion and stenosis of left carotid artery: Secondary | ICD-10-CM

## 2021-03-24 MED ORDER — CEPHALEXIN 500 MG PO CAPS: 500.0000 mg | ORAL_CAPSULE | Freq: Two times a day (BID) | ORAL | 0 refills | Status: DC

## 2021-03-24 NOTE — Progress Notes (Signed)
POST OPERATIVE OFFICE NOTE    CC:  F/u for surgery  HPI:  This is a 75 y.o. male who is s/p  1. Harvest of left leg great saphenous vein 2. Redo left carotid endarterectomy with excision of carotid stents  3. Ligation of left external carotid artery 4. Left common carotid arteryto internal carotid artery bypass with great saphenous vein  on 03/10/2021 by Dr. Carlis Abbott.    Pt returns today for follow up.  Pt states he went to see his PCP and he was concerned for fluid collection in the vein harvest site and he is here today to have that evaluated.  He states it has not been draining, it has not gotten any bigger.  There is some redness around the distal portion of the incision.  He has not had any fever or chills.    He denies any temporary blindness, speech difficulties.  He states that he is tiring out when chewing.  It takes him longer to chew and swallow than before surgery.  He is not choking when eating.    Pt and wife live in [place].    Allergies  Allergen Reactions  . Plavix [Clopidogrel] Rash    Current Outpatient Medications  Medication Sig Dispense Refill  . albuterol (VENTOLIN HFA) 108 (90 Base) MCG/ACT inhaler Inhale 2 puffs into the lungs every 6 (six) hours as needed (wheezing/shortness of breath).     [name] allopurinol (ZYLOPRIM) 300 MG tablet Take 300 mg by mouth daily.    [name] amLODipine (NORVASC) 10 MG tablet Take 10 mg by mouth daily.    [name] aspirin EC 81 MG tablet Take 1 tablet (81 mg total) by mouth daily. (Patient taking differently: Take 81 mg by mouth at bedtime.) 90 tablet 3  . azelastine (ASTELIN) 0.1 % nasal spray Place 1-2 sprays into both nostrils 2 (two) times daily as needed for rhinitis. Use in each nostril as directed    . carvedilol (COREG) 12.5 MG tablet Take 1 tablet (12.5 mg total) by mouth 2 (two) times daily. 90 tablet 3  . ELIQUIS 5 MG TABS tablet TAKE 1 TABLET(5 MG) BY MOUTH TWICE DAILY (Patient taking differently: Take 5 mg by mouth 2 (two) times  daily.) 180 tablet 3  . furosemide (LASIX) 40 MG tablet Take 40 mg by mouth as needed for fluid (swelling).    . gabapentin (NEURONTIN) 300 MG capsule Take 300 mg by mouth 2 (two) times daily.    [name] losartan-hydrochlorothiazide (HYZAAR) 50-12.5 MG tablet Take 1 tablet by mouth daily.    [name] omeprazole (PRILOSEC) 20 MG capsule Take 20 mg by mouth daily with breakfast.     . oxyCODONE-acetaminophen (PERCOCET) 7.5-325 MG tablet Take 1 tablet by mouth every 6 (six) hours as needed for severe pain. 15 tablet 0  . potassium chloride (KLOR-CON) 10 MEQ tablet TAKE 1 TABLET(10 MEQ) BY MOUTH DAILY (Patient taking differently: Take 10 mEq by mouth daily.) 90 tablet 1  . rosuvastatin (CRESTOR) 20 MG tablet Take 1 tablet (20 mg total) by mouth every evening. 30 tablet 0  . TRELEGY ELLIPTA 100-62.5-25 MCG/INH AEPB Inhale 1 puff into the lungs at bedtime.      No current facility-administered medications for this visit.     ROS:  See HPI  Physical Exam:  Today's Vitals   03/24/21 1351  BP: 119/66  Pulse: 84  Resp: 20  Temp: 97.7 F (36.5 C)  TempSrc: Temporal  SpO2: 97%  Weight: 226 lb 1.6 oz (102.6  kg)  Height: '6\' 1"'$  (1.854 m)  PainSc: 6    Body mass index is 29.83 kg/m.   Incision:  Left neck incision is healing nicely.  Left leg incision with probable hematoma distally as it is firm.  There is mild erythema present around the distal incision.      Neuro: moving all extremities equally; tongue is midline.  There is no facial droop or marginal mandibular neuropraxia.      Assessment/Plan:  This is a 75 y.o. male who is s/p: 1. Harvest of left leg great saphenous vein 2. Redo left carotid endarterectomy with excision of carotid stents  3. Ligation of left external carotid artery 4. Left common carotid arteryto internal carotid artery bypass with great saphenous vein 03/10/2021 by Dr. Carlis Abbott   -incisions are healing nicely.  No new neurological events.  -he has probable hematoma  distally on vein harvest site of left leg.  There is some erythema.  Prescribed keflex '500mg'$  bid x 5 days.  -mild swelling RLE-not unexpected after vein harvest-discussed with pt to elevate his legs as he can tolerate.   -he has appt to come back on 4/26 with PA so that we can check his incision and pt been seen by Dr. Carlis Abbott.  Pt is in agreement.  He will call sooner if he has any issues.    Leontine Locket, [facility] [phone]   Clinic MD:  Scot Dock

## 2021-03-30 ENCOUNTER — Other Ambulatory Visit: Payer: Self-pay

## 2021-03-30 ENCOUNTER — Ambulatory Visit (INDEPENDENT_AMBULATORY_CARE_PROVIDER_SITE_OTHER): Payer: Medicare Other | Admitting: Physician Assistant

## 2021-03-30 ENCOUNTER — Other Ambulatory Visit: Payer: Self-pay | Admitting: *Deleted

## 2021-03-30 ENCOUNTER — Encounter (HOSPITAL_COMMUNITY): Payer: Self-pay | Admitting: Vascular Surgery

## 2021-03-30 VITALS — BP 110/80 | HR 80 | Temp 98.0°F | Resp 20 | Ht 73.0 in | Wt 231.5 lb

## 2021-03-30 DIAGNOSIS — I6522 Occlusion and stenosis of left carotid artery: Secondary | ICD-10-CM

## 2021-03-30 NOTE — Progress Notes (Signed)
Mr. [name] denies chest pain or shortness of breath.  Patient denies s/s of Covid in his home and is not aware of any exposure to Covid.

## 2021-03-30 NOTE — Progress Notes (Signed)
POST OPERATIVE OFFICE NOTE    CC:  F/u for surgery  HPI:  This is a 75 y.o. male who is s/p  1. Harvest of left leg great saphenous vein 2. Redo left carotid endarterectomy with excision of carotid stents  3. Ligation of left external carotid artery 4. Left common carotid arteryto internal carotid artery bypass with great saphenous vein  on 03/10/2021 by Dr. Carlis Abbott.    He was seen last week for concern for fluid collection in the vein harvest site.  It had not been draining and had not gotten any bigger.  There was some redness around the distal portion of the incision.  He did not have any fever or chills.  He did not have any stroke sx.  It was taking him longer to chew and swallow than before surgery but he was not choking when he swallowed.  It was improving.   Pt returns today for follow up.  Pt states he continues to have pain and feels it has gotten a little bit bigger.   He hasn't had any fevers.  He finished his abx course.  He is on Eliquis and asa.  He has not had any neurological sx.  States he is having some swelling in the left leg.   He and his wife live in [place].  Allergies  Allergen Reactions  . Plavix [Clopidogrel] Rash    Current Outpatient Medications  Medication Sig Dispense Refill  . albuterol (VENTOLIN HFA) 108 (90 Base) MCG/ACT inhaler Inhale 2 puffs into the lungs every 6 (six) hours as needed (wheezing/shortness of breath).     [name] allopurinol (ZYLOPRIM) 300 MG tablet Take 300 mg by mouth daily.    [name] amLODipine (NORVASC) 10 MG tablet Take 10 mg by mouth daily.    [name] aspirin EC 81 MG tablet Take 1 tablet (81 mg total) by mouth daily. (Patient taking differently: Take 81 mg by mouth at bedtime.) 90 tablet 3  . azelastine (ASTELIN) 0.1 % nasal spray Place 1-2 sprays into both nostrils 2 (two) times daily as needed for rhinitis. Use in each nostril as directed    . carvedilol (COREG) 12.5 MG tablet Take 1 tablet (12.5 mg total) by mouth 2 (two) times daily.  90 tablet 3  . cephALEXin (KEFLEX) 500 MG capsule Take 1 capsule (500 mg total) by mouth 2 (two) times daily. 10 capsule 0  . ELIQUIS 5 MG TABS tablet TAKE 1 TABLET(5 MG) BY MOUTH TWICE DAILY (Patient taking differently: Take 5 mg by mouth 2 (two) times daily.) 180 tablet 3  . furosemide (LASIX) 40 MG tablet Take 40 mg by mouth as needed for fluid (swelling).    . gabapentin (NEURONTIN) 300 MG capsule Take 300 mg by mouth 2 (two) times daily.    [name] losartan-hydrochlorothiazide (HYZAAR) 50-12.5 MG tablet Take 1 tablet by mouth daily.    [name] omeprazole (PRILOSEC) 20 MG capsule Take 20 mg by mouth daily with breakfast.     . oxyCODONE-acetaminophen (PERCOCET) 7.5-325 MG tablet Take 1 tablet by mouth every 6 (six) hours as needed for severe pain. 15 tablet 0  . potassium chloride (KLOR-CON) 10 MEQ tablet TAKE 1 TABLET(10 MEQ) BY MOUTH DAILY (Patient taking differently: Take 10 mEq by mouth daily.) 90 tablet 1  . rosuvastatin (CRESTOR) 20 MG tablet Take 1 tablet (20 mg total) by mouth every evening. 30 tablet 0  . TRELEGY ELLIPTA 100-62.5-25 MCG/INH AEPB Inhale 1 puff into the lungs at bedtime.  No current facility-administered medications for this visit.     ROS:  See HPI  Physical Exam:  Today's Vitals   03/30/21 1548 03/30/21 1552  BP: 112/66 110/80  Pulse: 80   Resp: 20   Temp: 98 F (36.7 C)   TempSrc: Temporal   SpO2: 97%   Weight: 231 lb 8 oz (105 kg)   Height: '6\' 1"'$  (1.854 m)   PainSc: 7     Body mass index is 30.54 kg/m.   Incision:  There continues to be erythema around the incision.  It is more firm today and slightly larger.  Neuro:  In tact.     Assessment/Plan:  This is a 75 y.o. male who is s/p: 1. Harvest of left leg great saphenous vein 2. Redo left carotid endarterectomy with excision of carotid stents  3. Ligation of left external carotid artery 4. Left common carotid arteryto internal carotid artery bypass with great saphenous vein  on 03/10/2021 by  Dr. Carlis Abbott.    -given there was some erythema around the incision, he was given Keflex for 5 days.  He did have some swelling and discussed elevation with pt. -pt was examined with Dr. Carlis Abbott today and his incision continues to have erythema that may be a little worse today and it is slightly larger.  Will plan for I&D of left leg vein harvest site tomorrow with Dr. Carlis Abbott.  Have instructed pt to hold Eliquis tonight and tomorrow morning.  It is okay to continue asa.  Pt and wife in agreement.   -discussed leg elevation with pt and gave him handout about leg swelling.     Leontine Locket, [facility] [phone]   Clinic MD:  Pt seen with Dr. Carlis Abbott

## 2021-03-30 NOTE — Patient Outreach (Addendum)
[place] [facility]) Care Management  03/30/2021  [name] Sep 05, 1946 [id number]   Telephone outreach for [place] ON EMMI CALL (Has not had follow up), unsuccessful, left a message and and requested a return call if pt has any problems or questions.  Chart review indicates he has a follow up vascular appt this afternoon. Primary care MD EMR not accessible via Epic to view for primary care follow up.  Eulah Pont. Myrtie Neither, MSN, [facility] Gerontological Nurse Practitioner [facility] Care Management [phone]

## 2021-03-31 ENCOUNTER — Ambulatory Visit (HOSPITAL_COMMUNITY)
Admission: RE | Admit: 2021-03-31 | Discharge: 2021-03-31 | Disposition: A | Discharge status: Home / Self Care | Payer: Medicare Other | Attending: Vascular Surgery | Admitting: Vascular Surgery

## 2021-03-31 ENCOUNTER — Encounter (HOSPITAL_COMMUNITY): Payer: Self-pay | Admitting: Vascular Surgery

## 2021-03-31 ENCOUNTER — Encounter (HOSPITAL_COMMUNITY)
Admission: RE | Disposition: A | Discharge status: Home / Self Care | Payer: Self-pay | Source: Home / Self Care | Attending: Vascular Surgery

## 2021-03-31 ENCOUNTER — Ambulatory Visit (HOSPITAL_COMMUNITY): Payer: Medicare Other | Admitting: Anesthesiology

## 2021-03-31 DIAGNOSIS — Z7901 Long term (current) use of anticoagulants: Secondary | ICD-10-CM | POA: Diagnosis not present

## 2021-03-31 DIAGNOSIS — S7012XA Contusion of left thigh, initial encounter: Secondary | ICD-10-CM | POA: Diagnosis present

## 2021-03-31 DIAGNOSIS — Z888 Allergy status to other drugs, medicaments and biological substances status: Secondary | ICD-10-CM | POA: Insufficient documentation

## 2021-03-31 DIAGNOSIS — Z20822 Contact with and (suspected) exposure to covid-19: Secondary | ICD-10-CM | POA: Insufficient documentation

## 2021-03-31 DIAGNOSIS — Z7982 Long term (current) use of aspirin: Secondary | ICD-10-CM | POA: Insufficient documentation

## 2021-03-31 DIAGNOSIS — Z79899 Other long term (current) drug therapy: Secondary | ICD-10-CM | POA: Diagnosis not present

## 2021-03-31 DIAGNOSIS — Y839 Surgical procedure, unspecified as the cause of abnormal reaction of the patient, or of later complication, without mention of misadventure at the time of the procedure: Secondary | ICD-10-CM | POA: Diagnosis not present

## 2021-03-31 DIAGNOSIS — I97648 Postprocedural seroma of a circulatory system organ or structure following other circulatory system procedure: Secondary | ICD-10-CM

## 2021-03-31 HISTORY — DX: Unspecified osteoarthritis, unspecified site: M19.90

## 2021-03-31 HISTORY — DX: Chronic obstructive pulmonary disease, unspecified: J44.9

## 2021-03-31 HISTORY — PX: INCISION AND DRAINAGE: SHX5863

## 2021-03-31 LAB — POCT I-STAT, CHEM 8
BUN: 44 mg/dL — ABNORMAL HIGH (ref 8–23)
Calcium, Ion: 1.15 mmol/L (ref 1.15–1.40)
Chloride: 100 mmol/L (ref 98–111)
Creatinine, Ser: 3.5 mg/dL — ABNORMAL HIGH (ref 0.61–1.24)
Glucose, Bld: 87 mg/dL (ref 70–99)
HCT: 33 % — ABNORMAL LOW (ref 39.0–52.0)
Hemoglobin: 11.2 g/dL — ABNORMAL LOW (ref 13.0–17.0)
Potassium: 3.8 mmol/L (ref 3.5–5.1)
Sodium: 136 mmol/L (ref 135–145)
TCO2: 23 mmol/L (ref 22–32)

## 2021-03-31 LAB — SARS CORONAVIRUS 2 BY RT PCR (HOSPITAL ORDER, PERFORMED IN ~~LOC~~ HOSPITAL LAB): SARS Coronavirus 2: NEGATIVE

## 2021-03-31 SURGERY — INCISION AND DRAINAGE
Anesthesia: Monitor Anesthesia Care | Laterality: Left

## 2021-03-31 MED ORDER — CEFAZOLIN SODIUM-DEXTROSE 2-4 GM/100ML-% IV SOLN
2.0000 g | INTRAVENOUS | Status: AC
  Administered 2021-03-31: 2 g via INTRAVENOUS
  Filled 2021-03-31: qty 100

## 2021-03-31 MED ORDER — PHENYLEPHRINE HCL (PRESSORS) 10 MG/ML IV SOLN
INTRAVENOUS | Status: DC | PRN
  Administered 2021-03-31 (×2): 80 ug via INTRAVENOUS

## 2021-03-31 MED ORDER — FENTANYL CITRATE (PF) 250 MCG/5ML IJ SOLN
INTRAMUSCULAR | Status: AC
  Filled 2021-03-31: qty 5

## 2021-03-31 MED ORDER — PROPOFOL 500 MG/50ML IV EMUL
INTRAVENOUS | Status: DC | PRN
  Administered 2021-03-31: 100 ug/kg/min via INTRAVENOUS

## 2021-03-31 MED ORDER — 0.9 % SODIUM CHLORIDE (POUR BTL) OPTIME
TOPICAL | Status: DC | PRN
  Administered 2021-03-31: 1000 mL

## 2021-03-31 MED ORDER — CHLORHEXIDINE GLUCONATE 0.12 % MT SOLN
15.0000 mL | Freq: Once | OROMUCOSAL | Status: AC
  Administered 2021-03-31: 15 mL via OROMUCOSAL

## 2021-03-31 MED ORDER — LIDOCAINE HCL (PF) 1 % IJ SOLN
INTRAMUSCULAR | Status: AC
  Filled 2021-03-31: qty 30

## 2021-03-31 MED ORDER — CHLORHEXIDINE GLUCONATE 4 % EX LIQD: 60.0000 mL | Freq: Once | CUTANEOUS | Status: DC

## 2021-03-31 MED ORDER — SODIUM CHLORIDE 0.9 % IV SOLN: INTRAVENOUS | Status: DC

## 2021-03-31 MED ORDER — ONDANSETRON HCL 4 MG/2ML IJ SOLN
INTRAMUSCULAR | Status: DC | PRN
  Administered 2021-03-31: 4 mg via INTRAVENOUS

## 2021-03-31 MED ORDER — CHLORHEXIDINE GLUCONATE 0.12 % MT SOLN
OROMUCOSAL | Status: AC
  Filled 2021-03-31: qty 15

## 2021-03-31 MED ORDER — ORAL CARE MOUTH RINSE: 15.0000 mL | Freq: Once | OROMUCOSAL | Status: AC

## 2021-03-31 MED ORDER — AMISULPRIDE (ANTIEMETIC) 5 MG/2ML IV SOLN
10.0000 mg | Freq: Once | INTRAVENOUS | Status: DC | PRN
Start: 2021-03-31 — End: 2021-03-31

## 2021-03-31 MED ORDER — PROPOFOL 10 MG/ML IV BOLUS
INTRAVENOUS | Status: AC
  Filled 2021-03-31: qty 20

## 2021-03-31 MED ORDER — FENTANYL CITRATE (PF) 100 MCG/2ML IJ SOLN
INTRAMUSCULAR | Status: DC | PRN
  Administered 2021-03-31: 100 ug via INTRAVENOUS

## 2021-03-31 MED ORDER — OXYCODONE-ACETAMINOPHEN 10-325 MG PO TABS: 1.0000 | ORAL_TABLET | Freq: Four times a day (QID) | ORAL | 0 refills | Status: DC | PRN

## 2021-03-31 MED ORDER — LIDOCAINE-EPINEPHRINE 1 %-1:100000 IJ SOLN
INTRAMUSCULAR | Status: DC | PRN
  Administered 2021-03-31: 20 mL

## 2021-03-31 MED ORDER — LIDOCAINE-EPINEPHRINE 1 %-1:100000 IJ SOLN
INTRAMUSCULAR | Status: AC
  Filled 2021-03-31: qty 1

## 2021-03-31 MED ORDER — FENTANYL CITRATE (PF) 100 MCG/2ML IJ SOLN: 25.0000 ug | INTRAMUSCULAR | Status: DC | PRN

## 2021-03-31 SURGICAL SUPPLY — 49 items
ADH SKN CLS APL DERMABOND .7 (GAUZE/BANDAGES/DRESSINGS) ×1
AGENT HMST SPONGE THK3/8 (HEMOSTASIS)
ARMBAND PINK RESTRICT EXTREMIT (MISCELLANEOUS) ×2 IMPLANT
CANISTER SUCT 3000ML PPV (MISCELLANEOUS) ×2 IMPLANT
CLIP VESOCCLUDE MED 24/CT (CLIP) ×2 IMPLANT
CLIP VESOCCLUDE MED 6/CT (CLIP) ×1 IMPLANT
CLIP VESOCCLUDE SM WIDE 24/CT (CLIP) ×2 IMPLANT
CLIP VESOCCLUDE SM WIDE 6/CT (CLIP) ×1 IMPLANT
COVER PROBE W GEL 5X96 (DRAPES) ×1 IMPLANT
COVER WAND RF STERILE (DRAPES) ×1 IMPLANT
DECANTER SPIKE VIAL GLASS SM (MISCELLANEOUS) ×2 IMPLANT
DERMABOND ADVANCED (GAUZE/BANDAGES/DRESSINGS) ×1
DERMABOND ADVANCED .7 DNX12 (GAUZE/BANDAGES/DRESSINGS) ×1 IMPLANT
DRAIN HEMOVAC 1/8 X 5 (WOUND CARE) ×1 IMPLANT
DRSG COVADERM 4X8 (GAUZE/BANDAGES/DRESSINGS) ×1 IMPLANT
ELECT REM PT RETURN 9FT ADLT (ELECTROSURGICAL) ×2 IMPLANT
ELECTRODE REM PT RTRN 9FT ADLT (ELECTROSURGICAL) ×1 IMPLANT
EVACUATOR SILICONE 100CC (DRAIN) ×1 IMPLANT
GAUZE SPONGE 4X4 12PLY STRL (GAUZE/BANDAGES/DRESSINGS) ×1 IMPLANT
GLOVE BIO SURGEON STRL SZ7.5 (GLOVE) ×2 IMPLANT
GLOVE SRG 8 PF TXTR STRL LF DI (GLOVE) ×1 IMPLANT
GLOVE SURG UNDER POLY LF SZ8 (GLOVE) ×2
GOWN STRL REUS W/ TWL LRG LVL3 (GOWN DISPOSABLE) ×2 IMPLANT
GOWN STRL REUS W/ TWL XL LVL3 (GOWN DISPOSABLE) ×2 IMPLANT
GOWN STRL REUS W/TWL LRG LVL3 (GOWN DISPOSABLE) ×4
GOWN STRL REUS W/TWL XL LVL3 (GOWN DISPOSABLE) ×2
HEMOSTAT SPONGE AVITENE ULTRA (HEMOSTASIS) IMPLANT
KIT BASIN OR (CUSTOM PROCEDURE TRAY) ×2 IMPLANT
KIT TURNOVER KIT B (KITS) ×2 IMPLANT
NDL HYPO 25GX1X1/2 BEV (NEEDLE) ×1 IMPLANT
NEEDLE HYPO 25GX1X1/2 BEV (NEEDLE) ×2 IMPLANT
NS IRRIG 1000ML POUR BTL (IV SOLUTION) ×2 IMPLANT
PACK CV ACCESS (CUSTOM PROCEDURE TRAY) ×2 IMPLANT
PAD ARMBOARD 7.5X6 YLW CONV (MISCELLANEOUS) ×3 IMPLANT
SUT ETHILON 3 0 FSL (SUTURE) ×1 IMPLANT
SUT ETHILON 3 0 PS 1 (SUTURE) ×2 IMPLANT
SUT MNCRL AB 4-0 PS2 18 (SUTURE) ×1 IMPLANT
SUT PROLENE 6 0 BV (SUTURE) ×1 IMPLANT
SUT PROLENE 7 0 BV 1 (SUTURE) IMPLANT
SUT SILK 2 0 SH (SUTURE) IMPLANT
SUT VIC AB 2-0 CT1 27 (SUTURE)
SUT VIC AB 2-0 CT1 TAPERPNT 27 (SUTURE) ×1 IMPLANT
SUT VIC AB 3-0 SH 27 (SUTURE)
SUT VIC AB 3-0 SH 27X BRD (SUTURE) ×2 IMPLANT
SYR BULB IRRIG 60ML STRL (SYRINGE) ×1 IMPLANT
TAPE CLOTH SURG 4X10 WHT LF (GAUZE/BANDAGES/DRESSINGS) ×1 IMPLANT
TOWEL GREEN STERILE (TOWEL DISPOSABLE) ×2 IMPLANT
UNDERPAD 30X36 HEAVY ABSORB (UNDERPADS AND DIAPERS) ×2 IMPLANT
WATER STERILE IRR 1000ML POUR (IV SOLUTION) ×2 IMPLANT

## 2021-03-31 NOTE — Anesthesia Procedure Notes (Signed)
Procedure Name: MAC Date/Time: 03/31/2021 3:45 PM Performed by: Babs Bertin, CRNA Pre-anesthesia Checklist: Patient identified, Emergency Drugs available, Suction available, Patient being monitored and Timeout performed Patient Re-evaluated:Patient Re-evaluated prior to induction Oxygen Delivery Method: Simple face mask

## 2021-03-31 NOTE — H&P (Signed)
History and Physical Interval Note:  03/31/2021 2:47 PM  [name]  has presented today for surgery, with the diagnosis of GROIN SWELLING.  The various methods of treatment have been discussed with the patient and family. After consideration of risks, benefits and other options for treatment, the patient has consented to  Procedure(s): INCISION AND DRAINAGE OF LEFT LEG [place] (Left) as a surgical intervention.  The patient's history has been reviewed, patient examined, no change in status, stable for surgery.  I have reviewed the patient's chart and labs.  Questions were answered to the patient's satisfaction.     I&D vein harvest site left leg  Marty Heck  POST OPERATIVE OFFICE NOTE    CC:  F/u for surgery  HPI:  This is a 75 y.o. male who is s/p  1. Harvest of left leg great saphenous vein 2. Redo left carotid endarterectomy with excision of carotid stents  3. Ligation of left external carotid artery 4. Left common carotid arteryto internal carotid artery bypass with great saphenous vein on 4/6/2022by Dr. Carlis Abbott.   He was seen last week for concern for fluid collection in the vein harvest site.  It had not been draining and had not gotten any bigger.  There was some redness around the distal portion of the incision.  He did not have any fever or chills.  He did not have any stroke sx.  It was taking him longer to chew and swallow than before surgery but he was not choking when he swallowed.  It was improving.   Pt returns today for follow up.  Pt states he continues to have pain and feels it has gotten a little bit bigger.   He hasn't had any fevers.  He finished his abx course.  He is on Eliquis and asa.  He has not had any neurological sx.  States he is having some swelling in the left leg.   He and his wife live in [place].      Allergies  Allergen Reactions  . Plavix [Clopidogrel] Rash          Current Outpatient Medications   Medication Sig Dispense Refill  . albuterol (VENTOLIN HFA) 108 (90 Base) MCG/ACT inhaler Inhale 2 puffs into the lungs every 6 (six) hours as needed (wheezing/shortness of breath).     [name] allopurinol (ZYLOPRIM) 300 MG tablet Take 300 mg by mouth daily.    [name] amLODipine (NORVASC) 10 MG tablet Take 10 mg by mouth daily.    [name] aspirin EC 81 MG tablet Take 1 tablet (81 mg total) by mouth daily. (Patient taking differently: Take 81 mg by mouth at bedtime.) 90 tablet 3  . azelastine (ASTELIN) 0.1 % nasal spray Place 1-2 sprays into both nostrils 2 (two) times daily as needed for rhinitis. Use in each nostril as directed    . carvedilol (COREG) 12.5 MG tablet Take 1 tablet (12.5 mg total) by mouth 2 (two) times daily. 90 tablet 3  . cephALEXin (KEFLEX) 500 MG capsule Take 1 capsule (500 mg total) by mouth 2 (two) times daily. 10 capsule 0  . ELIQUIS 5 MG TABS tablet TAKE 1 TABLET(5 MG) BY MOUTH TWICE DAILY (Patient taking differently: Take 5 mg by mouth 2 (two) times daily.) 180 tablet 3  . furosemide (LASIX) 40 MG tablet Take 40 mg by mouth as needed for fluid (swelling).    . gabapentin (NEURONTIN) 300 MG capsule Take 300 mg by mouth 2 (two) times  daily.    . losartan-hydrochlorothiazide (HYZAAR) 50-12.5 MG tablet Take 1 tablet by mouth daily.    [name] omeprazole (PRILOSEC) 20 MG capsule Take 20 mg by mouth daily with breakfast.     . oxyCODONE-acetaminophen (PERCOCET) 7.5-325 MG tablet Take 1 tablet by mouth every 6 (six) hours as needed for severe pain. 15 tablet 0  . potassium chloride (KLOR-CON) 10 MEQ tablet TAKE 1 TABLET(10 MEQ) BY MOUTH DAILY (Patient taking differently: Take 10 mEq by mouth daily.) 90 tablet 1  . rosuvastatin (CRESTOR) 20 MG tablet Take 1 tablet (20 mg total) by mouth every evening. 30 tablet 0  . TRELEGY ELLIPTA 100-62.5-25 MCG/INH AEPB Inhale 1 puff into the lungs at bedtime.      No current facility-administered medications for this visit.     ROS:  See  HPI  Physical Exam:      Today's Vitals   03/30/21 1548 03/30/21 1552  BP: 112/66 110/80  Pulse: 80   Resp: 20   Temp: 98 F (36.7 C)   TempSrc: Temporal   SpO2: 97%   Weight: 231 lb 8 oz (105 kg)   Height: '6\' 1"'$  (1.854 m)   PainSc: 7     Body mass index is 30.54 kg/m.   Incision:  There continues to be erythema around the incision.  It is more firm today and slightly larger.  Neuro:  In tact.     Assessment/Plan:  This is a 75 y.o. male who is s/p: 1. Harvest of left leg great saphenous vein 2. Redo left carotid endarterectomy with excision of carotid stents  3. Ligation of left external carotid artery 4. Left common carotid arteryto internal carotid artery bypass with great saphenous vein on 4/6/2022by Dr. Carlis Abbott.   -given there was some erythema around the incision, he was given Keflex for 5 days.  He did have some swelling and discussed elevation with pt. -pt was examined with Dr. Carlis Abbott today and his incision continues to have erythema that may be a little worse today and it is slightly larger.  Will plan for I&D of left leg vein harvest site tomorrow with Dr. Carlis Abbott.  Have instructed pt to hold Eliquis tonight and tomorrow morning.  It is okay to continue asa.  Pt and wife in agreement.   -discussed leg elevation with pt and gave him handout about leg swelling.     Leontine Locket, [facility] [phone]   Clinic MD:  Pt seen with Dr. Carlis Abbott

## 2021-03-31 NOTE — Progress Notes (Signed)
Patient drank half a cup of coffee with creamer at 0630 the morning of surgery to take medications.  Dr. Ola Spurr with anesthesia made aware.

## 2021-03-31 NOTE — Anesthesia Postprocedure Evaluation (Signed)
Anesthesia Post Note  Patient: [name]  Procedure(s) Performed: INCISION AND DRAINAGE OF LEFT THIGH WITH DRAIN PLACEMENT (Left )     Patient location during evaluation: PACU Anesthesia Type: MAC Level of consciousness: awake and alert Pain management: pain level controlled Vital Signs Assessment: post-procedure vital signs reviewed and stable Respiratory status: spontaneous breathing, nonlabored ventilation, respiratory function stable and patient connected to nasal cannula oxygen Cardiovascular status: stable and blood pressure returned to baseline Postop Assessment: no apparent nausea or vomiting Anesthetic complications: no   No complications documented.  Last Vitals:  Vitals:   03/31/21 1727 03/31/21 1741  BP: 130/82 131/73  Pulse: (!) 146 85  Resp: 12 14  Temp:    SpO2: 93% 94%    Last Pain:  Vitals:   03/31/21 1725  TempSrc:   PainSc: 0-No pain                 Tiajuana Amass

## 2021-03-31 NOTE — Op Note (Signed)
Date: March 31, 2021  Preoperative diagnosis: Left thigh hematoma at saphenectomy incision  Postoperative diagnosis: Left thigh seroma at saphenectomy incision  Procedure: Incision and drainage of left thigh seroma at saphenectomy site with 10 French drain placement  Surgeon: Dr. Marty Heck, MD  Assistant: OR staff  Indication: Patient is a 75 year old male who recently underwent left thigh great saphenous vein harvest for re-do carotid endarterectomy with carotid artery bypass.  He recently presented to clinic with pain and swelling at the left thigh saphenectomy incision.  He presents today for incision and drainage after risk benefits discussed.  Findings: After the left thigh incision was opened this appeared to be a seroma with no signs of purulence.  The seroma was drained including the seroma cavity was excised.  A 10 French flat JP drain was placed in the wound.  The incision was closed with 3-0 Nylon.  Anesthesia: MAC  Details: Patient was taken to the operating room after informed consent was obtained.  Placed on the operative table in the supine position.  After anesthesia induced the left thigh was prepped and draped in usual sterile fashion.  A timeout was performed to identify patient, procedure and site.  I initially injected 1% lidocaine with epinephrine for a total of 10 mL along the distal incision.  I then used a scalpel and the previous incision was reopened distally.  We encountered a large seroma cavity that was then drained.  I used sterile saline and irrigated the cavity.  The seroma cavity itself was excised.  I then placed a flat JP 10 French drain that was then tunneled out through the thigh medially in the subcutaneous tissue and this was secured with multiple 3-0 nylon's.  I then closed the incision with multiple 3-0 nylons in interrupted fashion.  I injected another 10 mL of 1% lidocaine with epinephrine.  Dry sterile dressings were applied.  Complication:  None  Condition: Stable  Marty Heck, MD [facility] [place] Office: [place]

## 2021-03-31 NOTE — Anesthesia Preprocedure Evaluation (Addendum)
Anesthesia Evaluation  Patient identified by MRN, date of birth, ID band Patient awake    Reviewed: Allergy & Precautions, NPO status , Patient's Chart, lab work & pertinent test results, reviewed documented beta blocker date and time   Airway Mallampati: II  TM Distance: >3 FB Neck ROM: Full    Dental  (+) Dental Advisory Given   Pulmonary COPD, Current Smoker,    breath sounds clear to auscultation       Cardiovascular hypertension, Pt. on medications and Pt. on home beta blockers + Peripheral Vascular Disease   Rhythm:Regular Rate:Normal     Neuro/Psych  Neuromuscular disease CVA    GI/Hepatic Neg liver ROS, GERD  ,  Endo/Other  negative endocrine ROS  Renal/GU Renal disease     Musculoskeletal  (+) Arthritis ,   Abdominal   Peds  Hematology negative hematology ROS (+)   Anesthesia Other Findings   Reproductive/Obstetrics                             Lab Results  Component Value Date   WBC 15.7 (H) 03/12/2021   HGB 11.2 (L) 03/31/2021   HCT 33.0 (L) 03/31/2021   MCV 94.1 03/12/2021   PLT 189 03/12/2021   Lab Results  Component Value Date   CREATININE 3.50 (H) 03/31/2021   BUN 44 (H) 03/31/2021   NA 136 03/31/2021   K 3.8 03/31/2021   CL 100 03/31/2021   CO2 20 (L) 03/12/2021    Anesthesia Physical Anesthesia Plan  ASA: III  Anesthesia Plan: MAC   Post-op Pain Management:    Induction:   PONV Risk Score and Plan: 0 and Propofol infusion, Ondansetron and Treatment may vary due to age or medical condition  Airway Management Planned: Natural Airway and Simple Face Mask  Additional Equipment:   Intra-op Plan:   Post-operative Plan:   Informed Consent: I have reviewed the patients History and Physical, chart, labs and discussed the procedure including the risks, benefits and alternatives for the proposed anesthesia with the patient or authorized representative who  has indicated his/her understanding and acceptance.       Plan Discussed with: CRNA  Anesthesia Plan Comments:        Anesthesia Quick Evaluation

## 2021-03-31 NOTE — Transfer of Care (Signed)
Immediate Anesthesia Transfer of Care Note  Patient: [name]  Procedure(s) Performed: INCISION AND DRAINAGE OF LEFT THIGH WITH DRAIN PLACEMENT (Left )  Patient Location: PACU  Anesthesia Type:MAC  Level of Consciousness: awake, alert  and oriented  Airway & Oxygen Therapy: Patient Spontanous Breathing and Patient connected to nasal cannula oxygen  Post-op Assessment: Report given to RN and Post -op Vital signs reviewed and stable  Post vital signs: Reviewed and stable  Last Vitals:  Vitals Value Taken Time  BP 125/77 03/31/21 1640  Temp    Pulse 83 03/31/21 1640  Resp 8 03/31/21 1640  SpO2 99 % 03/31/21 1640  Vitals shown include unvalidated device data.  Last Pain:  Vitals:   03/31/21 1201  TempSrc:   PainSc: 6       Patients Stated Pain Goal: 4 (00/34/91 7915)  Complications: No complications documented.

## 2021-04-01 ENCOUNTER — Encounter (HOSPITAL_COMMUNITY): Payer: Self-pay | Admitting: Vascular Surgery

## 2021-04-01 ENCOUNTER — Telehealth: Payer: Self-pay

## 2021-04-01 NOTE — Telephone Encounter (Signed)
Patient's wife called to report copious amounts of "watermelon colored" drainage from patient's wound s/p I&D yesterday. They have changed the bandage 2-3 times today as well as emptied JP drain 2x. Discussed with Dr. Carlis Abbott - preferably continue to change dressings when they are soiled, but patient may come in to be seen by PA and evaluated for possible wound vac. Discussed with patient's wife, she would like to continue multiple dressings for now before coming back in. Advised that a sanitary maxi pad may be useful in absorbing drainage. They will call back if further issues prior to Tuesday appt.

## 2021-04-02 ENCOUNTER — Other Ambulatory Visit: Payer: Self-pay | Admitting: *Deleted

## 2021-04-02 ENCOUNTER — Encounter: Payer: Self-pay | Admitting: *Deleted

## 2021-04-02 NOTE — Patient Outreach (Signed)
[facility]) Care Management  04/02/2021  [name] 12-24-1945 [id number]   [name] outreach for EMMI-stroke Not on APL RED ON [place] Day #   13        Date: 03/26/21 10 am Friday Red Alert Reason: Had follow up appointment? No   Previous RED ON EMMI ALERT Day #1 Date Sunday 03/14/21 1400  Red Alert Reason: Filled new prescriptions? No  Problems with setting up rehab" Didn't need any  Insurance: medicare aetna senior supplement [facility] admissions x 1  ED visits x 1 in the last 6 months  03/09/21 to 03/12/21 Carotid artery thrombosis, left,  CKD stage III, HTN, persistent atrial fibrillation, cerebellar infarction  Outreach # 2 Patient is able to verify HIPAA identifiers [name] reviewed and addressed red alert with patient  Consent: [name] RN CM reviewed [name] services with patient. Patient gave verbal consent for services [name] telephonic RN CM.   Advised patient that there will be further automated EMMI- post discharge calls to assess how the patient is doing following the recent hospitalization Advised the patient that another call may be received from a nurse if any of their responses were abnormal. Patient voiced understanding and was appreciative of f/u call.    EMMI:  In [name] absence C Spinks left a voice message on 03/31/21 without a return call   [name] states the EMMI answer was incorrect He had 03/16/21 follow up with pcp and 03/31/21 follow with vascular surgeon  Subjective "I believe we are handling it well and doing okay"   Objective 04/06/21 Tuesday follow up appointment with vascular surgeon,  04/12/21 neurology follow appointment 05/04/21 cardiology appt 05/12/21 primary care provider (PCP) appt  [name] RN CM offered [facility] RN CM number and [name] 24 hour nurse line number but [name] did not feel they were needed Pt encouraged to return a call to [name] prn   [name] RN CM care coordination- none needed    Plan: Patient agrees  and Patient requests no follow up at this time [facility] RN CM will close stroke EMMI case - not on APL Sent a [name] successful outreach letter with [facility] contact information and 24 hour nurse line information   Tawni Melkonian L. Lavina Hamman, RN, BSN, [place] Coordinator Office number [phone] Mobile number [phone]  Main [name] number [phone] Fax number [phone]

## 2021-04-06 ENCOUNTER — Other Ambulatory Visit: Payer: Self-pay

## 2021-04-06 ENCOUNTER — Ambulatory Visit (INDEPENDENT_AMBULATORY_CARE_PROVIDER_SITE_OTHER): Payer: Medicare Other | Admitting: Physician Assistant

## 2021-04-06 VITALS — BP 105/63 | HR 87 | Temp 98.0°F | Resp 20 | Ht 73.0 in | Wt 233.6 lb

## 2021-04-06 DIAGNOSIS — L7634 Postprocedural seroma of skin and subcutaneous tissue following other procedure: Secondary | ICD-10-CM

## 2021-04-06 NOTE — Progress Notes (Signed)
POST OPERATIVE OFFICE NOTE    CC:  F/u for surgery  HPI:  This is a 75 y.o. male who is s/p incision and drainage of left thigh seroma at saphenectomy site with placement of 10 French surgical drain on March 31, 2021 by Dr. Carlis Abbott.  Had recently undergone left thigh great saphenous vein harvest for redo carotid endarterectomy with carotid artery bypass on March 10, 2021.   His wife accompanies him today.  She has been keeping a log of his drain output.  It is down to approximately 30 cc in 24 hours.  Over the past 2 days, the drainage around the drain exit site has decreased.  He denies fever or chills. He is having persistent left jaw fatigue when eating, however he is swallowing without difficulty.  He has resumed his aspirin and Eliquis. Allergies  Allergen Reactions  . Plavix [Clopidogrel] Rash    Current Outpatient Medications  Medication Sig Dispense Refill  . albuterol (VENTOLIN HFA) 108 (90 Base) MCG/ACT inhaler Inhale 2 puffs into the lungs every 6 (six) hours as needed (wheezing/shortness of breath).     [name] allopurinol (ZYLOPRIM) 300 MG tablet Take 300 mg by mouth daily.    [name] amLODipine (NORVASC) 10 MG tablet Take 10 mg by mouth daily.    [name] aspirin EC 81 MG tablet Take 1 tablet (81 mg total) by mouth daily. (Patient taking differently: Take 81 mg by mouth at bedtime.) 90 tablet 3  . azelastine (ASTELIN) 0.1 % nasal spray Place 1-2 sprays into both nostrils 2 (two) times daily as needed for rhinitis. Use in each nostril as directed    . carvedilol (COREG) 12.5 MG tablet Take 1 tablet (12.5 mg total) by mouth 2 (two) times daily. 90 tablet 3  . cephALEXin (KEFLEX) 500 MG capsule Take 1 capsule (500 mg total) by mouth 2 (two) times daily. 10 capsule 0  . ELIQUIS 5 MG TABS tablet TAKE 1 TABLET(5 MG) BY MOUTH TWICE DAILY (Patient taking differently: Take 5 mg by mouth 2 (two) times daily.) 180 tablet 3  . furosemide (LASIX) 40 MG tablet Take 40 mg by mouth as needed for fluid  (swelling).    . gabapentin (NEURONTIN) 300 MG capsule Take 300 mg by mouth 2 (two) times daily.    [name] losartan-hydrochlorothiazide (HYZAAR) 50-12.5 MG tablet Take 1 tablet by mouth daily.    [name] omeprazole (PRILOSEC) 20 MG capsule Take 20 mg by mouth daily with breakfast.     . oxyCODONE-acetaminophen (PERCOCET) 10-325 MG tablet Take 1 tablet by mouth every 6 (six) hours as needed for pain. 20 tablet 0  . potassium chloride (KLOR-CON) 10 MEQ tablet TAKE 1 TABLET(10 MEQ) BY MOUTH DAILY (Patient taking differently: Take 10 mEq by mouth daily.) 90 tablet 1  . rosuvastatin (CRESTOR) 20 MG tablet Take 1 tablet (20 mg total) by mouth every evening. 30 tablet 0  . TRELEGY ELLIPTA 100-62.5-25 MCG/INH AEPB Inhale 1 puff into the lungs at bedtime.      No current facility-administered medications for this visit.     ROS:  See HPI  BP 105/63 (BP Location: Right Arm, Patient Position: Sitting, Cuff Size: Large)   Pulse 87   Temp 98 F (36.7 C) (Temporal)   Resp 20   Ht '6\' 1"'$  (1.854 m)   Wt 233 lb 9.6 oz (106 kg)   SpO2 92%   BMI 30.82 kg/m   Physical Exam:  General appearance: Well-developed, well-nourished in no apparent distress Cardiac: Rate and  rhythm are regular Respiratory: Nonlabored, chronic cough Incision: Left thigh incision is well approximated Extremities: Left lower extremity: Moderate thigh edema.  There is drainage in drain bulb. Neuro: Alert and oriented x4, face symmetric, speech is fluent.      Assessment/Plan:  This is a 75 y.o. male who is s/p: Incision and drainage of left greater saphenous vein due to seroma.  Dr. Carlis Abbott examined the patient.  We will discontinue his drain today.  We discussed continued wound care with the patient and his wife.  We also explained that he could reaccumulate the seroma which would require part of drainage and placement of wound VAC dressing.  Surgical drain discontinued and 4 x 4 gauze folded placed over drain site.  Cavilon skin prep and  foam border [facility] border) dressing applied.  Continue sutures for another week.  Follow-up in 1 week  Risa Grill, PA-C [facility] [phone]  Clinic MD: Dr. Carlis Abbott

## 2021-04-12 ENCOUNTER — Ambulatory Visit (INDEPENDENT_AMBULATORY_CARE_PROVIDER_SITE_OTHER): Payer: Medicare Other | Admitting: Adult Health

## 2021-04-12 ENCOUNTER — Encounter: Payer: Self-pay | Admitting: Adult Health

## 2021-04-12 VITALS — BP 121/71 | HR 48 | Ht 73.0 in | Wt 226.0 lb

## 2021-04-12 DIAGNOSIS — I482 Chronic atrial fibrillation, unspecified: Secondary | ICD-10-CM

## 2021-04-12 DIAGNOSIS — G453 Amaurosis fugax: Secondary | ICD-10-CM

## 2021-04-12 DIAGNOSIS — I639 Cerebral infarction, unspecified: Secondary | ICD-10-CM

## 2021-04-12 DIAGNOSIS — I6522 Occlusion and stenosis of left carotid artery: Secondary | ICD-10-CM

## 2021-04-12 NOTE — Progress Notes (Signed)
[facility] [address] [place]. [place] [address] [phone]       HOSPITAL FOLLOW UP NOTE  Mr. [name] Date of Birth:  January 26, 1946 Medical Record Number:  [id number]   Reason for Referral:  hospital stroke follow up    SUBJECTIVE:   CHIEF COMPLAINT:  Chief Complaint  Patient presents with  . Follow-up    TR with wife (vicki) Pt is having some pain in Leg and jaw due to vain replacement     HPI:   Mr. [name] is a 75 y.o. male with history of CKD stage III, hypertension, hyperlipidemia, PAD, A. fib on Eliquis, left CEA 2007 followed by stenting 2011 and 2020  admitted on 03/09/2021 for episodes of left vision loss x2. No tPA given due to symptom resolved.    Personally reviewed hospitalization pertinent progress notes, lab work and imaging with summary provided.  Transient episodes of left vision loss amaurosis fugax with left carotid stenosis s/p L CCA and ICA bypass 03/10/2021.  Incidental finding of left small cerebellar infarct embolic secondary to known A. Fib vs large vessel disease source due to left VA origin stenosis.  LDL 87 - increased Crestor from 10 to '20mg'$  daily.  A1c 6.9.  Placed on aspirin 81 mg daily and resumed Eliquis for chronic A. fib at discharge.  Evaluated by therapies and discharged home in stable condition without therapy needs or residual deficits.  Today, 04/12/2021, [name] is being seen for hospital follow-up accompanied by his wife. Doing well from stroke standpoint since discharge without residual deficits or new/reoccurring stroke/TIA symptoms Compliant on aspirin, Eliquis and Crestor without associated side effects Blood pressure today 121/71 -does not routinely monitor at home is typically stable  Greatest complaint today is in regards to continued jaw and left leg pain since his procedure.  Underwent incision and and drainage of left leg vein harvest site 03/31/2021 for groin swelling without complication.   Surgical drain removed 5/3 but reports continued (although improved) drainage and swelling.  Has f/u visit with [facility] tomorrow for reevaluation  No further concerns at this time    ROS:   14 system review of systems performed and negative with exception of those listed in HPI  PMH:  Past Medical History:  Diagnosis Date  . Arthritis   . CKD (chronic kidney disease), stage III ([place])   . COPD (chronic obstructive pulmonary disease) ([place])   . Dyspnea on exertion   . Edema   . HTN (hypertension)   . Hyperlipidemia   . Left carotid stenosis    a. 2007 s/p L CEA;  b. 2011 s/p L common carotid stenting 2/2 restenosis;  c. [id number] u/s: RICA [id number], LICA [id number], patent LCCA stent.  [name] PAD (peripheral artery disease) (H[facility])    a. s/p prior RSFA, REIA, distal LSFA (known to be occluded), LEIA, and LCIA stenting;  b. 01/2015 ABI's: R 0.78, L 0.53.  [name] Stroke [facility]) 03/2021   no residual     PSH:  Past Surgical History:  Procedure Laterality Date  . ABDOMINAL AORTAGRAM N/A 02/05/2015   Procedure: ABDOMINAL Maxcine Ham;  Surgeon: Conrad Arnett, MD;  Location: [facility] CATH LAB;  Service: Cardiovascular;  Laterality: N/A;  . AORTIC ARCH ANGIOGRAPHY N/A 07/19/2019   Procedure: AORTIC ARCH ANGIOGRAPHY;  Surgeon: Elam Dutch, MD;  Location: [place] CV LAB;  Service: Cardiovascular;  Laterality: N/A;  . CARDIOVERSION N/A 11/06/2019   Procedure: CARDIOVERSION;  Surgeon: Pixie Casino, MD;  Location: [facility] ENDOSCOPY;  Service: Cardiovascular;  Laterality: N/A;  . CAROTID ANGIOGRAPHY N/A 07/19/2019   Procedure: CAROTID ANGIOGRAPHY;  Surgeon: Elam Dutch, MD;  Location: [place] CV LAB;  Service: Cardiovascular;  Laterality: N/A;  . carotid endarterecotomy    . CAROTID PTA/STENT INTERVENTION Left 08/02/2019   Procedure: CAROTID PTA/STENT INTERVENTION;  Surgeon: Elam Dutch, MD;  Location: [place] CV LAB;  Service: Cardiovascular;  Laterality: Left;  . ENDARTERECTOMY Left 03/10/2021   Procedure:  REDO LEFT CAROTID ENDARTERECTOMY WITH LEFT COMMON TO INTERNAL CAROTID ARTERY BYPASS;  Surgeon: Marty Heck, MD;  Location: [facility] OR;  Service: Vascular;  Laterality: Left;  . external iliac     status post bilateral and left common iliac artery  . INCISION AND DRAINAGE Left 03/31/2021   Procedure: INCISION AND DRAINAGE OF LEFT THIGH WITH DRAIN PLACEMENT;  Surgeon: Marty Heck, MD;  Location: [place];  Service: Vascular;  Laterality: Left;  . LIGATION OF ARTERIOVENOUS  FISTULA Left 03/10/2021   Procedure: LIGATION OF LEFT EXTERNAL CAROTID;  Surgeon: Marty Heck, MD;  Location: [place];  Service: Vascular;  Laterality: Left;  . lumbar back surgery     x2  . LUMBAR DISC SURGERY     x 2  . stnt     lower extermity  . TENDON REPAIR     to the left arm  . TONSILLECTOMY    . VEIN HARVEST Left 03/10/2021   Procedure: VEIN HARVEST FROM LEFT SAPHENOUS VEIN;  Surgeon: Marty Heck, MD;  Location: [facility] OR;  Service: Vascular;  Laterality: Left;    Social History:  Social History   Socioeconomic History  . Marital status: Married    Spouse name: Vicky  . Number of children: Not on file  . Years of education: Not on file  . Highest education level: Not on file  Occupational History  . Not on file  Tobacco Use  . Smoking status: Current Every Day Smoker    Packs/day: 0.40    Years: 50.00    Pack years: 20.00    Types: Cigarettes  . Smokeless tobacco: Never Used  Vaping Use  . Vaping Use: Never used  Substance and Sexual Activity  . Alcohol use: Yes    Alcohol/week: 3.0 standard drinks    Types: 3 Glasses of wine per week  . Drug use: No  . Sexual activity: Not on file  Other Topics Concern  . Not on file  Social History Narrative  . Not on file   Social Determinants of Health   Financial Resource Strain: Low Risk   . Difficulty of Paying Living Expenses: Not hard at all  Food Insecurity: No Food Insecurity  . Worried About Charity fundraiser in the Last  Year: Never true  . Ran Out of Food in the Last Year: Never true  Transportation Needs: No Transportation Needs  . Lack of Transportation (Medical): No  . Lack of Transportation (Non-Medical): No  Physical Activity: Not on file  Stress: No Stress Concern Present  . Feeling of Stress : Not at all  Social Connections: Not on file  Intimate Partner Violence: Not At Risk  . Fear of Current or Ex-Partner: No  . Emotionally Abused: No  . Physically Abused: No  . Sexually Abused: No    Family History:  Family History  Problem Relation Age of Onset  . Hypertension Mother   . Hyperlipidemia Mother     Medications:   Current Outpatient  Medications on File Prior to Visit  Medication Sig Dispense Refill  . albuterol (VENTOLIN HFA) 108 (90 Base) MCG/ACT inhaler Inhale 2 puffs into the lungs every 6 (six) hours as needed (wheezing/shortness of breath).     [name] allopurinol (ZYLOPRIM) 300 MG tablet Take 300 mg by mouth daily.    [name] amLODipine (NORVASC) 10 MG tablet Take 10 mg by mouth daily.    [name] aspirin EC 81 MG tablet Take 1 tablet (81 mg total) by mouth daily. (Patient taking differently: Take 81 mg by mouth at bedtime.) 90 tablet 3  . azelastine (ASTELIN) 0.1 % nasal spray Place 1-2 sprays into both nostrils 2 (two) times daily as needed for rhinitis. Use in each nostril as directed    . carvedilol (COREG) 12.5 MG tablet Take 1 tablet (12.5 mg total) by mouth 2 (two) times daily. 90 tablet 3  . cephALEXin (KEFLEX) 500 MG capsule Take 1 capsule (500 mg total) by mouth 2 (two) times daily. 10 capsule 0  . ELIQUIS 5 MG TABS tablet TAKE 1 TABLET(5 MG) BY MOUTH TWICE DAILY (Patient taking differently: Take 5 mg by mouth 2 (two) times daily.) 180 tablet 3  . furosemide (LASIX) 40 MG tablet Take 40 mg by mouth as needed for fluid (swelling).    . gabapentin (NEURONTIN) 300 MG capsule Take 300 mg by mouth 2 (two) times daily.    [name] losartan-hydrochlorothiazide (HYZAAR) 50-12.5 MG tablet Take 1 tablet by  mouth daily.    [name] omeprazole (PRILOSEC) 20 MG capsule Take 20 mg by mouth daily with breakfast.     . oxyCODONE-acetaminophen (PERCOCET) 10-325 MG tablet Take 1 tablet by mouth every 6 (six) hours as needed for pain. 20 tablet 0  . potassium chloride (KLOR-CON) 10 MEQ tablet TAKE 1 TABLET(10 MEQ) BY MOUTH DAILY (Patient taking differently: Take 10 mEq by mouth daily.) 90 tablet 1  . rosuvastatin (CRESTOR) 20 MG tablet Take 1 tablet (20 mg total) by mouth every evening. 30 tablet 0  . TRELEGY ELLIPTA 100-62.5-25 MCG/INH AEPB Inhale 1 puff into the lungs at bedtime.      No current facility-administered medications on file prior to visit.    Allergies:   Allergies  Allergen Reactions  . Plavix [Clopidogrel] Rash      OBJECTIVE:  Physical Exam  Vitals:   04/12/21 1357  BP: 121/71  Pulse: (!) 48  Weight: 226 lb (102.5 kg)  Height: '6\' 1"'$  (1.854 m)   Body mass index is 29.82 kg/m. No exam data present  Post stroke PHQ 2/9 Depression screen PHQ 2/9 04/02/2021  Decreased Interest 0  Down, Depressed, Hopeless 0  PHQ - 2 Score 0     General: well developed, well nourished,  very pleasant elderly Caucasian male, seated, in no evident distress Head: head normocephalic and atraumatic.   Neck: supple with no carotid or supraclavicular bruits Cardiovascular: irregular rate and rhythm, no murmurs Musculoskeletal: no deformity Vascular:  Normal pulses all extremities   Neurologic Exam Mental Status: Awake and fully alert.   Fluent speech and language.  Oriented to place and time. Recent and remote memory intact. Attention span, concentration and fund of knowledge appropriate. Mood and affect appropriate.  Cranial Nerves: Fundoscopic exam reveals sharp disc margins. Pupils equal, briskly reactive to light. Extraocular movements full without nystagmus. Visual fields full to confrontation. Hearing intact. Facial sensation intact. Face, tongue, palate moves normally and symmetrically.   Motor: Normal bulk and tone. Normal strength in all tested extremity muscles although  difficulty fully assessing left hip flexor due to LLE swelling and pain Sensory.: intact to touch , pinprick , position and vibratory sensation.  Coordination: Rapid alternating movements normal in all extremities. Finger-to-nose and heel-to-shin performed accurately bilaterally. Gait and Station: Arises from chair without difficulty. Stance is normal. Gait demonstrates normal stride length and balance with mild favoring of left leg. Tandem walk and heel toe not attempted.  Reflexes: 1+ and symmetric. Toes downgoing.     NIHSS  0 Modified Rankin  0      ASSESSMENT: [name] is a 75 y.o. year old male presented with transient episodes of left vision loss on 03/09/2021 likely amaurosis fugax in setting of left carotid stenosis s/p L CCA and ICA bypass 03/10/2021 with hx of L CEA 2007 followed by stenting 2011 and 2020.  Also noted incidental left small cerebellar infarct without associated symptoms or deficits possibly secondary to known A. Fib vs left VA origin stenosis.  Vascular risk factors include HTN, HLD, PAD, A. fib on Eliquis, L CEA 2007 w/ stenting 2011 and 2022 and advanced age.      PLAN:  1. Amaurosis fugax, L cerebellar infarct (incidental finding): Recovered well without residual deficit.  Continue aspirin 81 mg daily and Eliquis (apixaban) daily  and Crestor for secondary stroke prevention.  Discussed secondary stroke prevention measures and importance of close PCP follow up for aggressive stroke risk factor management  2. HTN: BP goal <130/90.  Stable on current regimen per PCP 3. HLD: LDL goal <70. Recent LDL 87. On Crestor 20 mg daily per PCP 4. Chronic A. Fib: On Eliquis 5 mg twice daily for secondary stroke prevention routinely followed by cardiology 5. Left carotid stenosis:  a. S/p L CCA and ICA bypass 03/10/2021 b. Hx of L CEA 2007 with stenting 2011 and 2020 c. F/u visit  tomorrow with [facility] for postoperative seroma    Follow up in 6 months or call earlier if needed   CC:  [place] provider: Dr. Leonie Man PCP: Raelene Bott, MD    I spent 47 minutes of face-to-face and non-face-to-face time with patient and diet.  This included previsit chart review including recent hospitalization pertinent progress notes, lab work and imaging, extensive review of recent stroke and procedure, educated on secondary stroke prevention measures with importance of aggressive stroke risk factor management and prescribed medication compliance, procedure related concerns and answered all other questions to patient satisfaction   Frann Rider, AGNP-BC  [facility] [address] [place], [place] [address]  Phone [phone] Fax [phone] Note: This document was prepared with digital dictation and possible smart phrase technology. Any transcriptional errors that result from this process are unintentional.

## 2021-04-12 NOTE — Patient Instructions (Signed)
Continue aspirin 81 mg daily and Eliquis (apixaban) daily  and Crestor  for secondary stroke prevention  continue to follow with vascular surgery as scheduled tomorrow  Continue to follow up with PCP regarding cholesterol and blood pressure management  Maintain strict control of hypertension with blood pressure goal below 130/90 and cholesterol with LDL cholesterol (bad cholesterol) goal below 70 mg/dL.       Followup in the future with me in 6 months or call earlier if needed       Thank you for coming to see [place] at [facility]. I hope we have been able to provide you high quality care today.  You may receive a patient satisfaction survey over the next few weeks. We would appreciate your feedback and comments so that we may continue to improve ourselves and the health of our patients.

## 2021-04-13 ENCOUNTER — Other Ambulatory Visit: Payer: Self-pay

## 2021-04-13 ENCOUNTER — Ambulatory Visit (INDEPENDENT_AMBULATORY_CARE_PROVIDER_SITE_OTHER): Payer: Medicare Other | Admitting: Physician Assistant

## 2021-04-13 ENCOUNTER — Other Ambulatory Visit: Payer: Self-pay | Admitting: Student

## 2021-04-13 ENCOUNTER — Encounter: Payer: Self-pay | Admitting: Adult Health

## 2021-04-13 VITALS — BP 114/64 | HR 72 | Temp 97.9°F | Resp 20 | Ht 73.0 in | Wt 226.6 lb

## 2021-04-13 DIAGNOSIS — L7634 Postprocedural seroma of skin and subcutaneous tissue following other procedure: Secondary | ICD-10-CM

## 2021-04-13 DIAGNOSIS — M7989 Other specified soft tissue disorders: Secondary | ICD-10-CM

## 2021-04-13 NOTE — Progress Notes (Signed)
POST OPERATIVE OFFICE NOTE    CC:  F/u for surgery  HPI:  This is a 75 y.o. male who is s/p redo left CEA with harvest of left GSV, ligation of left external carotid artery, left CCA to ICA bypass with GSV on 03/10/2021 by Dr. Carlis Abbott.  He subsequently underwent  I&D of left thigh seroma at saphenectomy site and placement of JP drain on 03/31/2021 by Dr. Carlis Abbott.    Pt had CTA of the neck on 03/09/2021 and that revealed mild atherosclerotic disease in the right common carotid artery and right carotid bifurcation without significant stenosis.  He was seen back on 04/06/2021 and at that time, his JP drain was removed.   He was continuing to have persistent left jaw fatigue when eating but was swallowing without difficulty.    Pt returns today for follow up.  Pt states he still has some swelling and redness around the incision.  He has not had any fevers.  He states that there is some drainage but he feels this has gotten a little better as well as the pain.  He does have some tape burns from the bandage.  He continues to have some swelling in the left leg since the vein harvest.  He also has some swelling in the left leg and is on diuretic.   He denies any stroke sx.   Pt is on statin/asa/eliquis  Allergies  Allergen Reactions  . Plavix [Clopidogrel] Rash    Current Outpatient Medications  Medication Sig Dispense Refill  . albuterol (VENTOLIN HFA) 108 (90 Base) MCG/ACT inhaler Inhale 2 puffs into the lungs every 6 (six) hours as needed (wheezing/shortness of breath).     [name] allopurinol (ZYLOPRIM) 300 MG tablet Take 300 mg by mouth daily.    [name] amLODipine (NORVASC) 10 MG tablet Take 10 mg by mouth daily.    [name] aspirin EC 81 MG tablet Take 1 tablet (81 mg total) by mouth daily. (Patient taking differently: Take 81 mg by mouth at bedtime.) 90 tablet 3  . azelastine (ASTELIN) 0.1 % nasal spray Place 1-2 sprays into both nostrils 2 (two) times daily as needed for rhinitis. Use in each nostril as directed     . carvedilol (COREG) 12.5 MG tablet Take 1 tablet (12.5 mg total) by mouth 2 (two) times daily. 90 tablet 3  . cephALEXin (KEFLEX) 500 MG capsule Take 1 capsule (500 mg total) by mouth 2 (two) times daily. 10 capsule 0  . ELIQUIS 5 MG TABS tablet TAKE 1 TABLET(5 MG) BY MOUTH TWICE DAILY (Patient taking differently: Take 5 mg by mouth 2 (two) times daily.) 180 tablet 3  . furosemide (LASIX) 40 MG tablet Take 40 mg by mouth as needed for fluid (swelling).    . gabapentin (NEURONTIN) 300 MG capsule Take 300 mg by mouth 2 (two) times daily.    [name] losartan-hydrochlorothiazide (HYZAAR) 50-12.5 MG tablet Take 1 tablet by mouth daily.    [name] omeprazole (PRILOSEC) 20 MG capsule Take 20 mg by mouth daily with breakfast.     . oxyCODONE-acetaminophen (PERCOCET) 10-325 MG tablet Take 1 tablet by mouth every 6 (six) hours as needed for pain. 20 tablet 0  . potassium chloride (KLOR-CON) 10 MEQ tablet TAKE 1 TABLET(10 MEQ) BY MOUTH DAILY (Patient taking differently: Take 10 mEq by mouth daily.) 90 tablet 1  . rosuvastatin (CRESTOR) 20 MG tablet Take 1 tablet (20 mg total) by mouth every evening. 30 tablet 0  . TRELEGY ELLIPTA 100-62.5-25 MCG/INH AEPB  Inhale 1 puff into the lungs at bedtime.      No current facility-administered medications for this visit.     ROS:  See HPI  Physical Exam:  Today's Vitals   04/13/21 0918  BP: 114/64  Pulse: 72  Resp: 20  Temp: 97.9 F (36.6 C)  TempSrc: Temporal  SpO2: 96%  Weight: 226 lb 9.6 oz (102.8 kg)  Height: '6\' 1"'$  (1.854 m)   Body mass index is 29.9 kg/m.   Incision:  Clean with nylon sutures in tact.  There is some induration around the incision.  No evidence of infection.   Extremities: faintly palpable DP pulses bilaterally. Neuro: in tact.    Assessment/Plan:  This is a 75 y.o. male who is s/p: redo left CEA with harvest of left GSV, ligation of left external carotid artery, left CCA to ICA bypass with GSV on 03/10/2021 by Dr. Carlis Abbott.  He subsequently  underwent  I&D of left thigh seroma at saphenectomy site and placement of JP drain on 03/31/2021 by Dr. Carlis Abbott.     -pt neuro in tact and has not had any symptoms.   -pt seen with Dr. Carlis Abbott and feel the sutures need to stay another week.  The seroma is stable and not getting any larger.  Discussed with pt that over time, his body with absorb this.  There is no evidence of infection. -he does have some swelling in the left leg that is not unexpected after vein harvest.  Will measure him for 15-92mHg knee high compression socks here today.  Discussed with him about leg elevation as well.  Doubt DVT as he is on Eliquis and would also be the treatment if DVT present -discussed importance of smoking cessation.  Says he has cut way back and continues to try to quit.    -pt will need to f/u in 9 months with carotid duplex ([facility] day). -f/u in one week on Dr. [facility] day. -he knows that if he develops any stroke sx to call 911 -continue asa/statin/eliquis   SLeontine Locket [facility] [phone]  Clinic MD:  CCarlis Abbott

## 2021-04-14 ENCOUNTER — Other Ambulatory Visit: Payer: Self-pay

## 2021-04-14 DIAGNOSIS — I6523 Occlusion and stenosis of bilateral carotid arteries: Secondary | ICD-10-CM

## 2021-04-15 ENCOUNTER — Telehealth: Payer: Self-pay | Admitting: Cardiovascular Disease

## 2021-04-15 NOTE — Telephone Encounter (Signed)
   Patient Name: [name]  DOB 2045/12/07 MRN [id number]    Primary Cardiologist: Sherren Mocha, MD  Chart reviewed as part of pre-operative protocol coverage.   Simple dental extractions are considered low risk procedures per guidelines and generally do not require any specific cardiac clearance. It is also generally accepted that for simple extractions and dental cleanings, there is no need to interrupt blood thinner therapy.  SBE prophylaxis is not required for the patient from a cardiac standpoint.  I will route this recommendation to the requesting party via Epic fax function and remove from pre-op pool.  Please call with questions.  Loel Dubonnet, NP 04/15/2021, 1:32 PM

## 2021-04-15 NOTE — Progress Notes (Signed)
I agree with the above plan 

## 2021-04-15 NOTE — Telephone Encounter (Signed)
Would not recommend holding anticoagulation for single root canal.

## 2021-04-15 NOTE — Telephone Encounter (Signed)
       [facility] Pre-operative Risk Assessment    Patient Name: [name]  DOB: [birth year]  MRN: [id number]   HEARTCARE STAFF: - Please ensure there is not already an duplicate clearance open for this procedure. - Under Visit Info/Reason for Call, type in Other and utilize the format Clearance MM/DD/YY or Clearance TBD. Do not use dashes or single digits. - If request is for dental extraction, please clarify the # of teeth to be extracted.  Request for surgical clearance:  1. What type of surgery is being performed? 1 tooth extractions or maybe root canal  2. When is this surgery scheduled? 04/15/21  3. What type of clearance is required (medical clearance vs. Pharmacy clearance to hold med vs. Both)? Both  4. Are there any medications that need to be held prior to surgery and how long? Eliquis  5. Practice name and name of physician performing surgery? Dr. Tyson Alias - [facility]  6. What is the office phone number? [place]   7.   What is the office fax number? [phone]  8.   Anesthesia type (None, local, MAC, general) ? Numbing injection  Pt is going to be in the office in an hour   [place] 04/15/2021, 9:33 AM  _________________________________________________________________   (provider comments below)

## 2021-04-20 ENCOUNTER — Ambulatory Visit (INDEPENDENT_AMBULATORY_CARE_PROVIDER_SITE_OTHER): Payer: Medicare Other | Admitting: Physician Assistant

## 2021-04-20 ENCOUNTER — Other Ambulatory Visit: Payer: Self-pay

## 2021-04-20 VITALS — BP 130/74 | HR 82 | Temp 98.0°F | Resp 20 | Ht 73.0 in | Wt 221.9 lb

## 2021-04-20 DIAGNOSIS — L7634 Postprocedural seroma of skin and subcutaneous tissue following other procedure: Secondary | ICD-10-CM

## 2021-04-20 NOTE — Progress Notes (Signed)
POST OPERATIVE OFFICE NOTE    CC:  F/u for surgery  HPI:  This is a 75 y.o. male who is s/p redo left CEA with harvest of left GSV, ligation of left external carotid artery, left CCA to ICA bypass with GSV on 03/10/2021 by Dr. Carlis Abbott.  He subsequently underwent  I&D of left thigh seroma at saphenectomy site and placement of JP drain on 03/31/2021 by Dr. Carlis Abbott.    Pt had CTA of the neck on 03/09/2021 and that revealed mild atherosclerotic disease in the right common carotid artery and right carotid bifurcation without significant stenosis.  He was seen back on 04/06/2021 and at that time, his JP drain was removed.   He was continuing to have persistent left jaw fatigue when eating but was swallowing without difficulty.    Pt returns today for follow up.  Pt states continued local edema with erythema and clear drainage form left medial thigh vein harvest site.  The patient denise fever or chills.  Allergies  Allergen Reactions  . Plavix [Clopidogrel] Rash    Current Outpatient Medications  Medication Sig Dispense Refill  . albuterol (VENTOLIN HFA) 108 (90 Base) MCG/ACT inhaler Inhale 2 puffs into the lungs every 6 (six) hours as needed (wheezing/shortness of breath).     [name] allopurinol (ZYLOPRIM) 300 MG tablet Take 300 mg by mouth daily.    [name] amLODipine (NORVASC) 10 MG tablet Take 10 mg by mouth daily.    [name] aspirin EC 81 MG tablet Take 1 tablet (81 mg total) by mouth daily. (Patient taking differently: Take 81 mg by mouth at bedtime.) 90 tablet 3  . azelastine (ASTELIN) 0.1 % nasal spray Place 1-2 sprays into both nostrils 2 (two) times daily as needed for rhinitis. Use in each nostril as directed    . carvedilol (COREG) 12.5 MG tablet Take 1 tablet (12.5 mg total) by mouth 2 (two) times daily. 90 tablet 3  . cephALEXin (KEFLEX) 500 MG capsule Take 1 capsule (500 mg total) by mouth 2 (two) times daily. 10 capsule 0  . chlorhexidine (PERIDEX) 0.12 % solution SMARTSIG:Topical    . ELIQUIS 5 MG  TABS tablet TAKE 1 TABLET(5 MG) BY MOUTH TWICE DAILY (Patient taking differently: Take 5 mg by mouth 2 (two) times daily.) 180 tablet 3  . furosemide (LASIX) 40 MG tablet Take 40 mg by mouth as needed for fluid (swelling).    . gabapentin (NEURONTIN) 300 MG capsule Take 300 mg by mouth 2 (two) times daily.    [name] losartan-hydrochlorothiazide (HYZAAR) 50-12.5 MG tablet Take 1 tablet by mouth daily.    [name] omeprazole (PRILOSEC) 20 MG capsule Take 20 mg by mouth daily with breakfast.     . oxyCODONE-acetaminophen (PERCOCET) 10-325 MG tablet Take 1 tablet by mouth every 6 (six) hours as needed for pain. 20 tablet 0  . potassium chloride (KLOR-CON) 10 MEQ tablet TAKE 1 TABLET(10 MEQ) BY MOUTH DAILY (Patient taking differently: Take 10 mEq by mouth daily.) 90 tablet 1  . rosuvastatin (CRESTOR) 20 MG tablet Take 1 tablet (20 mg total) by mouth every evening. 30 tablet 0  . TRELEGY ELLIPTA 100-62.5-25 MCG/INH AEPB Inhale 1 puff into the lungs at bedtime.      No current facility-administered medications for this visit.     ROS:  See HPI  Physical Exam:      Incision:  Left medial thigh vein harvest site.  Nylon sutures were removed.  Probed with Q tip and clear/straw color drainage about  1/2 cup was drained out of the wound.  Mild erythema no purulence.   Left neck incision well healed, no tongue deviation, or facial droop.  He has an injury to the back of his mouth from intubation that is being followed by his dentist.  .   Lungs: non labored, clear to auscultation Abdomen:  Soft NTTP   Assessment/Plan:  This is a 75 y.o. male who is s/p:Left CEA with left thigh vein harvest bypass.  The left medial vein harvest site was packed with 1/2 in iodoform guaze.  The wound has proximal tunnel of 4 cm, distal tunnel of 3 cm and depth of 2 cm.  His wife was instructed on packing the wound and then cover with dry guaze daily.  Home health RN was ordered for wound checks.  He will f/u in 1-2 weeks for wound  check.  He continues top denise fever and chills.    Dr. Carlis Abbott examined the patient and agrees with plan of care.  If the incision fails to heal he will need to return to the OR for I & D with wound vac placement.    Roxy Horseman PA-C [facility] [phone]   Clinic MD:  Carlis Abbott

## 2021-04-30 ENCOUNTER — Other Ambulatory Visit: Payer: Self-pay | Admitting: Cardiovascular Disease

## 2021-05-03 NOTE — Progress Notes (Signed)
Cardiology Office Note:    Date:  05/04/2021   ID:  [name], DOB 12-31-1945, MRN [id number]  PCP:  Raelene Bott, MD   [facility] Providers Cardiologist:  Sherren Mocha, MD Cardiology APP:  Sharmon Revere      Referring MD: Raelene Bott, MD   Chief Complaint:  Hospitalization Follow-up (S/p redo L CEA )    Patient Profile:    [name] is a 75 y.o. male with:   Persistent Atrial fibrillation ? CHADS2-VASc=5 (age x 1, vascular dz, HTN, CVA) >>Apixaban ? S/p DCCV 11/2019 >> NSR  Peripheral arterial disease   Carotid artery disease (managed by Dr. Oneida Alar) ? S/p L CEA 2007 ? S/p L CCA stenting in 2011 2/2 restenosis ? S/p L ICA stenting 07/2019 2/2 restenosis ? S/p redo L CEA and L CCA to L ICA bypass 4/22  (HFpEF) heart failure with preserved ejection fraction   Hx of CVA   Hypertension   Hyperlipidemia   Chronic kidney disease 3 (Dr. Justin Mend)  COPD   Prior CV studies:  Echocardiogram 03/10/21 EF 55-60, no RWMA, mild LVH, Gr 1 DD, normal RVSF, trivial MR  3 day Zio Monitor 09/2019 Avg HR 93 (max HR 178) 2 episodes of NSVT (longest 4 beats)  Echocardiogram 08/27/2019 EF 55-60, mild LVH, mod LAE, mild MAC, trace MR, mod TR, RVSP 27.4  Carotid US 08/15/2019 Summary: Right Carotid: Velocities in the right ICA are consistent with a 1-39% stenosis. Non-hemodynamically significant plaque <50% noted in the CCA. The ECA appears <50% stenosed. Left Carotid: There is no evidence of stenosis in the left ICA. Patent left common carotid artery stent with no evidence of restenosis. Vertebrals: Right vertebral artery demonstrates antegrade flow. Left vertebral artery demonstrates high resistant flow. Subclavians: Normal flow hemodynamics were seen in bilateral subclavian arteries.  Myoview 02/04/15 Low risk stress nuclear study with small-sized, mild intensity fixed  apical perfusion defect (which is actually worse at rest than stress), likely attenuation artifact. LV Ejection Fraction: 60%.   History of Present Illness: [name] was last seen in 4/21.  He was admitted in 03/2021 with amaurosis fugax and L cerebellar infarct.  He had recurrent stenosis in his L carotid artery and underwent redo L CEA, excision of carotid stents and L CCA to L ICA bypass with SVG.  He was noted to have NSVT in the setting of low Mg2+ which was replaced.  EF was normal on echocardiogram.  His procedure was complicated by L thigh seroma at the saphenectomy site.  He underwent I&D 03/31/21.  His wound has been slow to heal.   He returns for Cardiology f/u.  He is here with his wife.  Overall he is feeling better.  He notes his L thigh wound is improving.  He was seen by S. Rhyne, PA-C and Dr. Carlis Abbott today.  The wound is smaller and they plan to see him back in 2 weeks.  The patient has not had chest pain. He has chronic shortness of breath.  This is unchanged. He has not had orthopnea.  He has leg edema that is usually managed well with furosemide.  He stopped it about a week ago thinking it would improve the drainage form his thigh wound.  He has not had syncope. But he does feel tired.           Past Medical History:  Diagnosis Date  . Arthritis   . CKD (chronic kidney disease), stage III ([place])   .  COPD (chronic obstructive pulmonary disease) ([place])   . Dyspnea on exertion   . Edema   . HTN (hypertension)   . Hyperlipidemia   . Left carotid stenosis    a. 2007 s/p L CEA;  b. 2011 s/p L common carotid stenting 2/2 restenosis;  c. 12/1939 u/s: RICA <74, LICA 08-14%, patent LCCA stent.  [name] PAD (peripheral artery disease) (HCC)    a. s/p prior RSFA, REIA, distal LSFA (known to be occluded), LEIA, and LCIA stenting;  b. 01/2015 ABI's: R 0.78, L 0.53.  [name] Stroke ([place]) 03/2021   no residual     Current Medications: Current Meds  Medication Sig  . albuterol (VENTOLIN HFA) 108 (90  Base) MCG/ACT inhaler Inhale 2 puffs into the lungs every 6 (six) hours as needed (wheezing/shortness of breath).   [name] allopurinol (ZYLOPRIM) 300 MG tablet Take 300 mg by mouth daily.  [name] aspirin EC 81 MG tablet Take 1 tablet (81 mg total) by mouth daily.  [name] azelastine (ASTELIN) 0.1 % nasal spray Place 1-2 sprays into both nostrils 2 (two) times daily as needed for rhinitis. Use in each nostril as directed  . cephALEXin (KEFLEX) 500 MG capsule Take 500 mg by mouth every 4 (four) months.  . chlorhexidine (PERIDEX) 0.12 % solution SMARTSIG:Topical  . ELIQUIS 5 MG TABS tablet TAKE 1 TABLET(5 MG) BY MOUTH TWICE DAILY  . furosemide (LASIX) 40 MG tablet Take 40 mg by mouth as needed for fluid (swelling).  . gabapentin (NEURONTIN) 300 MG capsule Take 300 mg by mouth 2 (two) times daily.  [name] losartan-hydrochlorothiazide (HYZAAR) 50-12.5 MG tablet Take 1 tablet by mouth daily.  [name] omeprazole (PRILOSEC) 20 MG capsule Take 20 mg by mouth daily with breakfast.   . oxyCODONE-acetaminophen (PERCOCET) 10-325 MG tablet Take 1 tablet by mouth every 6 (six) hours as needed for pain.  . potassium chloride (KLOR-CON) 10 MEQ tablet TAKE 1 TABLET(10 MEQ) BY MOUTH DAILY  . rosuvastatin (CRESTOR) 20 MG tablet Take 1 tablet (20 mg total) by mouth every evening.  . TRELEGY ELLIPTA 100-62.5-25 MCG/INH AEPB Inhale 1 puff into the lungs at bedtime.   . [DISCONTINUED] amLODipine (NORVASC) 10 MG tablet Take 10 mg by mouth daily.  . [DISCONTINUED] carvedilol (COREG) 12.5 MG tablet Take 1 tablet (12.5 mg total) by mouth 2 (two) times daily.     Allergies:   Plavix [clopidogrel]   Social History   Tobacco Use  . Smoking status: Current Every Day Smoker    Packs/day: 0.40    Years: 50.00    Pack years: 20.00    Types: Cigarettes  . Smokeless tobacco: Never Used  Vaping Use  . Vaping Use: Never used  Substance Use Topics  . Alcohol use: Yes    Alcohol/week: 3.0 standard drinks    Types: 3 Glasses of wine per week  .  Drug use: No     Family Hx: The patient's family history includes Hyperlipidemia in his mother; Hypertension in his mother.  ROS   EKGs/Labs/Other Test Reviewed:    EKG:  EKG is   ordered today.  The ekg ordered today demonstrates AFib, HR 108, normal axis, wide QRS, PRWP, QTc 495  Recent Labs: 03/09/2021: ALT 16 03/11/2021: Magnesium 1.8 03/12/2021: Platelets 189 03/31/2021: BUN 44; Creatinine, Ser 3.50; Hemoglobin 11.2; Potassium 3.8; Sodium 136   Recent Lipid Panel Lab Results  Component Value Date/Time   CHOL 145 03/10/2021 02:54 AM   TRIG 126 03/10/2021 02:54 AM   HDL 33 (L)  03/10/2021 02:54 AM   LDLCALC 87 03/10/2021 02:54 AM   LDLDIRECT 98.5 03/13/2012 08:36 AM      Risk Assessment/Calculations:    CHA2DS2-VASc Score = 7  This indicates a 11.2% annual risk of stroke. The patient's score is based upon: CHF History: Yes HTN History: Yes Diabetes History: No Stroke History: Yes Vascular Disease History: Yes Age Score: 2 Gender Score: 0     Physical Exam:    VS:  BP (!) 100/52   Pulse (!) 120   Ht _0  (1.854 m)   Wt 222 lb 6.4 oz (100.9 kg)   SpO2 94%   BMI 29.34 kg/m     Wt Readings from Last 3 Encounters:  05/04/21 222 lb 6.4 oz (100.9 kg)  05/04/21 223 lb 12.8 oz (101.5 kg)  04/20/21 221 lb 14.4 oz (100.7 kg)     Constitutional:      Appearance: Healthy appearance. Not in distress.  Neck:     Vascular: JVD normal.  Pulmonary:     Effort: Pulmonary effort is normal.     Breath sounds: No wheezing. No rales.  Cardiovascular:     Tachycardia present. Irregularly irregular rhythm. Normal S1. Normal S2.     Murmurs: There is no murmur.  Edema:    Pretibial: bilateral 2+ edema of the pretibial area.    Ankle: bilateral 1+ edema of the ankle. Abdominal:     Palpations: Abdomen is soft.  Skin:    General: Skin is warm and dry.  Neurological:     General: No focal deficit present.     Mental Status: Alert and oriented to person, place and time.      Cranial Nerves: Cranial nerves are intact.          ASSESSMENT & PLAN:    1. Persistent atrial fibrillation ([place]) He is back in atrial fibrillation with rapid rate.  He is really symptomatic.  His blood pressure is running somewhat low.  When he was in atrial fibrillation previously, we had difficulty controlling his heart rate and he had heart failure symptoms.  Therefore, I think trying to get him back in a normal rhythm would be important.  He does have a wide QRS on his electrocardiogram.  I reviewed this today with the EP (Dr. Curt Bears).  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  His QRS is typically narrow when he is in sinus rhythm.  Since his blood pressure is running low, I will stop his amlodipine.  I will increase his carvedilol to 18.75 mg twice daily to help his rate control.  Continue Apixaban.  He has follow-up with vascular surgery in a couple of weeks.  I will try to get him back with either Dr. Burt Knack, me or the atrial fibrillation clinic around that time as well.  If he remains in atrial fibrillation and there are no plans to take him back to the OR, we can proceed with cardioversion.  If he has recurrent atrial fibrillation after cardioversion, he will likely need antiarrhythmic therapy.  We can try to arrange this through the atrial fibrillation clinic as well.  -Increase Carvedilol to 18.75 mg twice daily   -Continue Apixaban  -F/u 2-3 weeks. If still in AFib and no plans for going to the OR, plan DCCV  2. Chronic heart failure with preserved ejection fraction (HCC) Overall, volume status appears stable.  He did stop furosemide to help the drainage in his left thigh.  I  will asked him to resume this.  He did have low magnesium in the hospital.  I will obtain a follow-up BMET, magnesium today.  3. Essential hypertension Blood pressure is running somewhat low.  As noted, I will stop his amlodipine.  4. Stage 3a chronic kidney disease  (HCC) Recent creatinine 3.5.  As noted, I will repeat a BMET today.  He is followed by Dr. Justin Mend with nephrology.  5. Bilateral extracranial carotid artery stenosis Status post recent redo left CEA with common carotid to internal carotid artery bypass.  He has had issues with left thigh seroma at the saphenectomy site.  This is followed closely by vascular surgery.    Dispo:  Return in about 2 weeks (around 05/18/2021) for Close Follow Up.   Medication Adjustments/Labs and Tests Ordered: Current medicines are reviewed at length with the patient today.  Concerns regarding medicines are outlined above.  Tests Ordered: Orders Placed This Encounter  Procedures  . Basic Metabolic Panel (BMET)  . Magnesium  . Amb Referral to AFIB Clinic   Medication Changes: Meds ordered this encounter  Medications  . carvedilol (COREG) 12.5 MG tablet    Sig: Take 1.5 tablets (18.75 mg total) by mouth 2 (two) times daily.    Dispense:  180 tablet    Refill:  3    Signed, [name], PA-C  05/04/2021 5:21 PM    [facility] [address], [place], [place]  [address] Phone: [phone]; Fax: [phone]

## 2021-05-04 ENCOUNTER — Encounter: Payer: Self-pay | Admitting: Physician Assistant

## 2021-05-04 ENCOUNTER — Other Ambulatory Visit: Payer: Self-pay

## 2021-05-04 ENCOUNTER — Ambulatory Visit (INDEPENDENT_AMBULATORY_CARE_PROVIDER_SITE_OTHER): Payer: Medicare Other | Admitting: Physician Assistant

## 2021-05-04 VITALS — BP 100/52 | HR 120 | Ht 73.0 in | Wt 222.4 lb

## 2021-05-04 VITALS — BP 99/61 | HR 79 | Temp 97.9°F | Resp 20 | Ht 73.0 in | Wt 223.8 lb

## 2021-05-04 DIAGNOSIS — I6522 Occlusion and stenosis of left carotid artery: Secondary | ICD-10-CM

## 2021-05-04 DIAGNOSIS — I4819 Other persistent atrial fibrillation: Secondary | ICD-10-CM

## 2021-05-04 DIAGNOSIS — N1831 Chronic kidney disease, stage 3a: Secondary | ICD-10-CM

## 2021-05-04 DIAGNOSIS — L7634 Postprocedural seroma of skin and subcutaneous tissue following other procedure: Secondary | ICD-10-CM

## 2021-05-04 DIAGNOSIS — I5032 Chronic diastolic (congestive) heart failure: Secondary | ICD-10-CM

## 2021-05-04 DIAGNOSIS — I10 Essential (primary) hypertension: Secondary | ICD-10-CM

## 2021-05-04 DIAGNOSIS — I6523 Occlusion and stenosis of bilateral carotid arteries: Secondary | ICD-10-CM

## 2021-05-04 MED ORDER — CARVEDILOL 12.5 MG PO TABS
18.7500 mg | ORAL_TABLET | Freq: Two times a day (BID) | ORAL | 3 refills | Status: DC
Start: 2021-05-04 — End: 2021-06-30

## 2021-05-04 NOTE — Patient Instructions (Signed)
Medication Instructions:  Your physician has recommended you make the following change in your medication:  1.  Discontinue Amlodipine  2.  Increase Coreg one and one half tablet ( 18.75 mg) twice daily.   *If you need a refill on your cardiac medications before your next appointment, please call your pharmacy*   Lab Work: TODAY!!!!   Mag/bmet If you have labs (blood work) drawn today and your tests are completely normal, you will receive your results only by: [name] MyChart Message (if you have MyChart) OR . A paper copy in the mail If you have any lab test that is abnormal or we need to change your treatment, we will call you to review the results.   Testing/Procedures: -None   Follow-Up: At [facility], you and your health needs are our priority.  As part of our continuing mission to provide you with exceptional heart care, we have created designated Provider Care Teams.  These Care Teams include your primary Cardiologist (physician) and Advanced Practice Providers (APPs -  Physician Assistants and Nurse Practitioners) who all work together to provide you with the care you need, when you need it.  We recommend signing up for the patient portal called "MyChart".  Sign up information is provided on this After Visit Summary.  MyChart is used to connect with patients for Virtual Visits (Telemedicine).  Patients are able to view lab/test results, encounter notes, upcoming appointments, etc.  Non-urgent messages can be sent to your provider as well.   To learn more about what you can do with MyChart, go to [email or web address].     AFIB CLINIC INFORMATION: Your appointment is scheduled on: Wednesday, June 15 at 11:00. Please arrive 15 minutes early for check-in. The AFib Clinic is located in the [facility] at [facility]. Parking instructions/directions: [address] (off [address]). When you pull in to Entrance C, there is an underground parking  garage to your right. The code to enter the garage is 4233. Take the elevators to the first floor. Follow the signs to the Heart and Vascular Special[facility] Clinics. You will see registration at the end of the hallway.  Phone number: [phone]

## 2021-05-04 NOTE — Progress Notes (Addendum)
POST OPERATIVE OFFICE NOTE    CC:  F/u for surgery  HPI:  This is a 75 y.o. male who is s/p redo left CEA with harvest of left GSV, ligation of left external carotid artery, left CCA to ICA bypass with GSVon 4/6/2022by Dr. Carlis Abbott.He subsequently underwent I&D of left thigh seroma at saphenectomy site and placement of JP drain on 03/31/2021 by Dr. Carlis Abbott.   Pt had CTA of the neck on 03/09/2021 and that revealed mildatherosclerotic disease in the right common carotid artery and right carotid bifurcation without significant stenosis.  He was seen back on 04/06/2021 and at that time, his JP drain was removed. He was continuing to have persistent left jaw fatigue when eating but was swallowing without difficulty.   Pt returns today for follow up with his wife.  Pt states the wound continues to drain.  His wife has been packing the wound with iodoform and states she is using less than when she started.  They are changing the dressing about every 3 hours bc the dressing saturates.  The drainage continues to be clear.  He as not had any fever or chills.    Allergies  Allergen Reactions  . Plavix [Clopidogrel] Rash    Current Outpatient Medications  Medication Sig Dispense Refill  . albuterol (VENTOLIN HFA) 108 (90 Base) MCG/ACT inhaler Inhale 2 puffs into the lungs every 6 (six) hours as needed (wheezing/shortness of breath).     [name] allopurinol (ZYLOPRIM) 300 MG tablet Take 300 mg by mouth daily.    [name] amLODipine (NORVASC) 10 MG tablet Take 10 mg by mouth daily.    [name] aspirin EC 81 MG tablet Take 1 tablet (81 mg total) by mouth daily. (Patient taking differently: Take 81 mg by mouth at bedtime.) 90 tablet 3  . azelastine (ASTELIN) 0.1 % nasal spray Place 1-2 sprays into both nostrils 2 (two) times daily as needed for rhinitis. Use in each nostril as directed    . carvedilol (COREG) 12.5 MG tablet Take 1 tablet (12.5 mg total) by mouth 2 (two) times daily. 90 tablet 3  . cephALEXin (KEFLEX) 500  MG capsule Take 1 capsule (500 mg total) by mouth 2 (two) times daily. 10 capsule 0  . chlorhexidine (PERIDEX) 0.12 % solution SMARTSIG:Topical    . ELIQUIS 5 MG TABS tablet TAKE 1 TABLET(5 MG) BY MOUTH TWICE DAILY (Patient taking differently: Take 5 mg by mouth 2 (two) times daily.) 180 tablet 3  . furosemide (LASIX) 40 MG tablet Take 40 mg by mouth as needed for fluid (swelling).    . gabapentin (NEURONTIN) 300 MG capsule Take 300 mg by mouth 2 (two) times daily.    [name] losartan-hydrochlorothiazide (HYZAAR) 50-12.5 MG tablet Take 1 tablet by mouth daily.    [name] omeprazole (PRILOSEC) 20 MG capsule Take 20 mg by mouth daily with breakfast.     . oxyCODONE-acetaminophen (PERCOCET) 10-325 MG tablet Take 1 tablet by mouth every 6 (six) hours as needed for pain. 20 tablet 0  . potassium chloride (KLOR-CON) 10 MEQ tablet TAKE 1 TABLET(10 MEQ) BY MOUTH DAILY 90 tablet 0  . rosuvastatin (CRESTOR) 20 MG tablet Take 1 tablet (20 mg total) by mouth every evening. 30 tablet 0  . TRELEGY ELLIPTA 100-62.5-25 MCG/INH AEPB Inhale 1 puff into the lungs at bedtime.      No current facility-administered medications for this visit.     ROS:  See HPI  Physical Exam:  Today's Vitals   05/04/21 1323  BP:  99/61  Pulse: 79  Resp: 20  Temp: 97.9 F (36.6 C)  TempSrc: Temporal  SpO2: 95%  Weight: 223 lb 12.8 oz (101.5 kg)  Height: '6\' 1"'$  (1.854 m)  PainSc: 2    Body mass index is 29.53 kg/m.   Incision:  Left thigh wound inspected with Dr. Carlis Abbott.  From previous visit, the wound is significantly smaller.  There is clear drainage from the wound.       Assessment/Plan:  This is a 75 y.o. male who is s/p: edo left CEA with harvest of left GSV, ligation of left external carotid artery, left CCA to ICA bypass with GSVon 4/6/2022by Dr. Carlis Abbott.He subsequently underwent I&D of left thigh seroma at saphenectomy site and placement of JP drain on 03/31/2021 by Dr. Carlis Abbott.    -pt seen with Dr. Carlis Abbott and he  states the wound is much smaller than when he saw it 2 weeks ago.  Pt continues to have persistent drainage from wound.  Hopeful this will improve as the wound continues to get smaller.  They will change from the iodoform and use 2x2 gauze to pack the wound followed by 4x4 and ABD pad.   -he will return to clinic in a couple of weeks for wound recheck.  They will call sooner if any issues before then. -soft blood pressure today.  He has appt with [name] this afternoon and he will discuss his BP with him at that time.    Leontine Locket, [facility] [phone]   Clinic MD:  Carlis Abbott

## 2021-05-05 ENCOUNTER — Other Ambulatory Visit: Payer: Self-pay | Admitting: *Deleted

## 2021-05-05 DIAGNOSIS — I5032 Chronic diastolic (congestive) heart failure: Secondary | ICD-10-CM

## 2021-05-05 DIAGNOSIS — I10 Essential (primary) hypertension: Secondary | ICD-10-CM

## 2021-05-05 DIAGNOSIS — Z79899 Other long term (current) drug therapy: Secondary | ICD-10-CM

## 2021-05-05 DIAGNOSIS — N1831 Chronic kidney disease, stage 3a: Secondary | ICD-10-CM

## 2021-05-05 DIAGNOSIS — I4819 Other persistent atrial fibrillation: Secondary | ICD-10-CM

## 2021-05-05 LAB — BASIC METABOLIC PANEL
BUN/Creatinine Ratio: 11 (ref 10–24)
BUN: 41 mg/dL — ABNORMAL HIGH (ref 8–27)
CO2: 20 mmol/L (ref 20–29)
Calcium: 9 mg/dL (ref 8.6–10.2)
Chloride: 97 mmol/L (ref 96–106)
Creatinine, Ser: 3.74 mg/dL — ABNORMAL HIGH (ref 0.76–1.27)
Glucose: 93 mg/dL (ref 65–99)
Potassium: 4.5 mmol/L (ref 3.5–5.2)
Sodium: 136 mmol/L (ref 134–144)
eGFR: 16 mL/min/{1.73_m2} — ABNORMAL LOW (ref >59)

## 2021-05-05 LAB — MAGNESIUM: Magnesium: 1.6 mg/dL (ref 1.6–2.3)

## 2021-05-05 MED ORDER — MAGNESIUM OXIDE 400 MG PO CAPS: 1.0000 | ORAL_CAPSULE | Freq: Two times a day (BID) | ORAL | 3 refills | Status: DC

## 2021-05-05 MED ORDER — AMLODIPINE BESYLATE 5 MG PO TABS: 5.0000 mg | ORAL_TABLET | Freq: Every day | ORAL | 3 refills | Status: DC

## 2021-05-07 NOTE — Addendum Note (Signed)
Addended by: Briant Cedar on: 05/07/2021 11:52 AM   Modules accepted: Orders

## 2021-05-13 ENCOUNTER — Other Ambulatory Visit: Payer: Medicare Other | Admitting: *Deleted

## 2021-05-13 ENCOUNTER — Other Ambulatory Visit: Payer: Self-pay

## 2021-05-13 DIAGNOSIS — I5032 Chronic diastolic (congestive) heart failure: Secondary | ICD-10-CM

## 2021-05-13 DIAGNOSIS — Z79899 Other long term (current) drug therapy: Secondary | ICD-10-CM

## 2021-05-13 DIAGNOSIS — I4819 Other persistent atrial fibrillation: Secondary | ICD-10-CM

## 2021-05-13 DIAGNOSIS — N1831 Chronic kidney disease, stage 3a: Secondary | ICD-10-CM

## 2021-05-13 DIAGNOSIS — I10 Essential (primary) hypertension: Secondary | ICD-10-CM

## 2021-05-13 LAB — BASIC METABOLIC PANEL
BUN/Creatinine Ratio: 11 (ref 10–24)
BUN: 34 mg/dL — ABNORMAL HIGH (ref 8–27)
CO2: 21 mmol/L (ref 20–29)
Calcium: 9.1 mg/dL (ref 8.6–10.2)
Chloride: 100 mmol/L (ref 96–106)
Creatinine, Ser: 2.98 mg/dL — ABNORMAL HIGH (ref 0.76–1.27)
Glucose: 87 mg/dL (ref 65–99)
Potassium: 5.2 mmol/L (ref 3.5–5.2)
Sodium: 134 mmol/L (ref 134–144)
eGFR: 21 mL/min/{1.73_m2} — ABNORMAL LOW (ref >59)

## 2021-05-13 LAB — MAGNESIUM: Magnesium: 1.7 mg/dL (ref 1.6–2.3)

## 2021-05-18 ENCOUNTER — Ambulatory Visit (INDEPENDENT_AMBULATORY_CARE_PROVIDER_SITE_OTHER): Payer: Medicare Other | Admitting: Physician Assistant

## 2021-05-18 ENCOUNTER — Other Ambulatory Visit: Payer: Self-pay

## 2021-05-18 VITALS — BP 104/65 | HR 89 | Temp 97.0°F | Resp 16 | Ht 73.0 in | Wt 217.0 lb

## 2021-05-18 DIAGNOSIS — L7634 Postprocedural seroma of skin and subcutaneous tissue following other procedure: Secondary | ICD-10-CM

## 2021-05-18 DIAGNOSIS — Z9889 Other specified postprocedural states: Secondary | ICD-10-CM

## 2021-05-18 NOTE — Progress Notes (Signed)
POST OPERATIVE OFFICE NOTE    CC:  F/u for surgery  HPI:  This is a 75 y.o. male who is s/p redo left CEA with harvest of left GSV, ligation of left external carotid artery, left CCA to ICA bypass with GSV on 03/10/2021 by Dr. Carlis Abbott.  He subsequently underwent  I&D of left thigh seroma at saphenectomy site and placement of JP drain on 03/31/2021 by Dr. Carlis Abbott.    Pt returns today for follow up with his wife for wound check.  Pt states the wound has not drained for the past week. His wife does most of the packing changes to the wound with HH coming out 1x per week.  They are changing the dressing once daily. No pain, redness or drainage. He as not had any fever or chills.     Allergies  Allergen Reactions   Plavix [Clopidogrel] Rash    Current Outpatient Medications  Medication Sig Dispense Refill   albuterol (VENTOLIN HFA) 108 (90 Base) MCG/ACT inhaler Inhale 2 puffs into the lungs every 6 (six) hours as needed (wheezing/shortness of breath).      allopurinol (ZYLOPRIM) 300 MG tablet Take 300 mg by mouth daily.     amLODipine (NORVASC) 5 MG tablet Take 1 tablet (5 mg total) by mouth daily. 90 tablet 3   aspirin EC 81 MG tablet Take 1 tablet (81 mg total) by mouth daily. 90 tablet 3   azelastine (ASTELIN) 0.1 % nasal spray Place 1-2 sprays into both nostrils 2 (two) times daily as needed for rhinitis. Use in each nostril as directed     carvedilol (COREG) 12.5 MG tablet Take 1.5 tablets (18.75 mg total) by mouth 2 (two) times daily. 180 tablet 3   cephALEXin (KEFLEX) 500 MG capsule Take 500 mg by mouth every 4 (four) months.     chlorhexidine (PERIDEX) 0.12 % solution SMARTSIG:Topical     ELIQUIS 5 MG TABS tablet TAKE 1 TABLET(5 MG) BY MOUTH TWICE DAILY 180 tablet 3   furosemide (LASIX) 40 MG tablet Take 40 mg by mouth as needed for fluid (swelling).     gabapentin (NEURONTIN) 300 MG capsule Take 300 mg by mouth 2 (two) times daily.     Magnesium Oxide 400 MG CAPS Take 1 capsule (400 mg  total) by mouth in the morning and at bedtime. 180 capsule 3   omeprazole (PRILOSEC) 20 MG capsule Take 20 mg by mouth daily with breakfast.      oxyCODONE-acetaminophen (PERCOCET) 10-325 MG tablet Take 1 tablet by mouth every 6 (six) hours as needed for pain. 20 tablet 0   potassium chloride (KLOR-CON) 10 MEQ tablet TAKE 1 TABLET(10 MEQ) BY MOUTH DAILY 90 tablet 0   rosuvastatin (CRESTOR) 20 MG tablet Take 1 tablet (20 mg total) by mouth every evening. 30 tablet 0   TRELEGY ELLIPTA 100-62.5-25 MCG/INH AEPB Inhale 1 puff into the lungs at bedtime.      No current facility-administered medications for this visit.     ROS:  See HPI  Physical Exam:  Vitals:   05/18/21 0959  BP: 104/65  Pulse: 89  Resp: 16  Temp: (!) 97 F (36.1 C)  TempSrc: Temporal  SpO2: 98%  Weight: 217 lb (98.4 kg)  Height: '6\' 1"'$  (1.854 m)   General: well appearing, well nourished Incision: left neck incision is well healed. Left proximal medial thigh wound at saphenectomy site has decreased in size. No drainage. Packed with wet  2x2 gauze followed by dry 4x4 and  tape  Extremities:  moving all extremities without deficits. Well perfused and warm Neuro: alert and oriented   Assessment/Plan:  This is a 75 y.o. male who is s/p redo left CEA with harvest of left GSV, ligation of left external carotid artery, left CCA to ICA bypass with GSV on 03/10/2021 by Dr. Carlis Abbott.  He subsequently underwent  I&D of left thigh seroma at saphenectomy site and placement of JP drain on 03/31/2021 by Dr. Carlis Abbott.   - left thigh saphenectomy site wound continues to improve. Continue to pack with 2x2 wet to dry gauze dressings followed by 4x4 and tape. -He will follow up in 2-3 weeks for wound check and carotid duplex   Karoline Caldwell, PA-C [facility] [phone]  Clinic MD:  Roxanne Mins

## 2021-05-19 ENCOUNTER — Ambulatory Visit (HOSPITAL_COMMUNITY)
Admission: RE | Admit: 2021-05-19 | Discharge: 2021-05-19 | Disposition: A | Discharge status: Home / Self Care | Payer: Medicare Other | Source: Ambulatory Visit | Attending: Physician Assistant | Admitting: Physician Assistant

## 2021-05-19 ENCOUNTER — Encounter (HOSPITAL_COMMUNITY): Payer: Self-pay | Admitting: Physician Assistant

## 2021-05-19 VITALS — BP 100/62 | HR 92 | Ht 73.0 in | Wt 217.8 lb

## 2021-05-19 DIAGNOSIS — R5383 Other fatigue: Secondary | ICD-10-CM | POA: Insufficient documentation

## 2021-05-19 DIAGNOSIS — N183 Chronic kidney disease, stage 3 unspecified: Secondary | ICD-10-CM | POA: Insufficient documentation

## 2021-05-19 DIAGNOSIS — Z7901 Long term (current) use of anticoagulants: Secondary | ICD-10-CM | POA: Diagnosis not present

## 2021-05-19 DIAGNOSIS — I5032 Chronic diastolic (congestive) heart failure: Secondary | ICD-10-CM | POA: Insufficient documentation

## 2021-05-19 DIAGNOSIS — Z7982 Long term (current) use of aspirin: Secondary | ICD-10-CM | POA: Diagnosis not present

## 2021-05-19 DIAGNOSIS — F17210 Nicotine dependence, cigarettes, uncomplicated: Secondary | ICD-10-CM | POA: Insufficient documentation

## 2021-05-19 DIAGNOSIS — R0602 Shortness of breath: Secondary | ICD-10-CM | POA: Diagnosis present

## 2021-05-19 DIAGNOSIS — I130 Hypertensive heart and chronic kidney disease with heart failure and stage 1 through stage 4 chronic kidney disease, or unspecified chronic kidney disease: Secondary | ICD-10-CM | POA: Insufficient documentation

## 2021-05-19 DIAGNOSIS — Z8249 Family history of ischemic heart disease and other diseases of the circulatory system: Secondary | ICD-10-CM | POA: Insufficient documentation

## 2021-05-19 DIAGNOSIS — Z79899 Other long term (current) drug therapy: Secondary | ICD-10-CM | POA: Insufficient documentation

## 2021-05-19 DIAGNOSIS — I4819 Other persistent atrial fibrillation: Secondary | ICD-10-CM | POA: Insufficient documentation

## 2021-05-19 DIAGNOSIS — I6523 Occlusion and stenosis of bilateral carotid arteries: Secondary | ICD-10-CM | POA: Insufficient documentation

## 2021-05-19 DIAGNOSIS — D6869 Other thrombophilia: Secondary | ICD-10-CM | POA: Diagnosis not present

## 2021-05-19 LAB — CBC
HCT: 34.6 % — ABNORMAL LOW (ref 39.0–52.0)
Hemoglobin: 11.2 g/dL — ABNORMAL LOW (ref 13.0–17.0)
MCH: 30.3 pg (ref 26.0–34.0)
MCHC: 32.4 g/dL (ref 30.0–36.0)
MCV: 93.5 fL (ref 80.0–100.0)
Platelets: 164 10*3/uL (ref 150–400)
RBC: 3.7 MIL/uL — ABNORMAL LOW (ref 4.22–5.81)
RDW: 15.2 % (ref 11.5–15.5)
WBC: 10.6 10*3/uL — ABNORMAL HIGH (ref 4.0–10.5)
nRBC: 0 % (ref 0.0–0.2)

## 2021-05-19 LAB — BASIC METABOLIC PANEL
Anion gap: 8 (ref 5–15)
BUN: 43 mg/dL — ABNORMAL HIGH (ref 8–23)
CO2: 23 mmol/L (ref 22–32)
Calcium: 8.7 mg/dL — ABNORMAL LOW (ref 8.9–10.3)
Chloride: 102 mmol/L (ref 98–111)
Creatinine, Ser: 3.08 mg/dL — ABNORMAL HIGH (ref 0.61–1.24)
GFR, Estimated: 20 mL/min — ABNORMAL LOW (ref >60)
Glucose, Bld: 91 mg/dL (ref 70–99)
Potassium: 5.2 mmol/L — ABNORMAL HIGH (ref 3.5–5.1)
Sodium: 133 mmol/L — ABNORMAL LOW (ref 135–145)

## 2021-05-19 NOTE — Progress Notes (Signed)
Primary Care Physician: Raelene Bott, MD Primary Cardiologist: Dr Burt Knack Primary Electrophysiologist: none Referring Physician: Andres Labrum Mcmurtry is a 75 y.o. male with a history of chronic diastolic CHF, HTN, CKD, bilateral carotid stenosis, atrial fibrillation who presents for consultation in the [facility]. Patient is on Eliquis for a CHADS2VASC score of 4. Patient seen by [name] 05/04/21 and found to be back in afib. He does have symptoms of fatigue and SOB. He had redo LCEA and L CCA bypass on 4/6/222 and had a seroma on his thigh which is healing. He denies any missed doses of anticoagulation in the last 3 weeks.   Today, he denies symptoms of palpitations, chest pain, orthopnea, PND, lower extremity edema, dizziness, presyncope, syncope, snoring, daytime somnolence, bleeding, or neurologic sequela. The patient is tolerating medications without difficulties and is otherwise without complaint today.    Atrial Fibrillation Risk Factors:  he does not have symptoms or diagnosis of sleep apnea. he does not have a history of rheumatic fever.   he has a BMI of Body mass index is 28.74 kg/m.[name] Filed Weights   05/19/21 1058  Weight: 98.8 kg    Family History  Problem Relation Age of Onset   Hypertension Mother    Hyperlipidemia Mother      Atrial Fibrillation Management history:  Previous antiarrhythmic drugs: none Previous cardioversions: 11/2019 Previous ablations: none CHADS2VASC score: 7 Anticoagulation history: Eliquis   Past Medical History:  Diagnosis Date   Arthritis    CKD (chronic kidney disease), stage III (HCC)    COPD (chronic obstructive pulmonary disease) (HCC)    Dyspnea on exertion    Edema    HTN (hypertension)    Hyperlipidemia    Left carotid stenosis    a. 2007 s/p L CEA;  b. 2011 s/p L common carotid stenting 2/2 restenosis;  c. [id number] u/s: RICA [id number], LICA [id number], patent LCCA stent.   PAD  (peripheral artery disease) (HCC)    a. s/p prior RSFA, REIA, distal LSFA (known to be occluded), LEIA, and LCIA stenting;  b. 01/2015 ABI's: R 0.78, L 0.53.   Stroke [facility]) 03/2021   no residual    Past Surgical History:  Procedure Laterality Date   ABDOMINAL AORTAGRAM N/A 02/05/2015   Procedure: ABDOMINAL AORTAGRAM;  Surgeon: Conrad East Carroll, MD;  Location: [facility] CATH LAB;  Service: Cardiovascular;  Laterality: N/A;   AORTIC ARCH ANGIOGRAPHY N/A 07/19/2019   Procedure: AORTIC ARCH ANGIOGRAPHY;  Surgeon: Elam Dutch, MD;  Location: [facility] CV LAB;  Service: Cardiovascular;  Laterality: N/A;   CARDIOVERSION N/A 11/06/2019   Procedure: CARDIOVERSION;  Surgeon: Pixie Casino, MD;  Location: [place];  Service: Cardiovascular;  Laterality: N/A;   CAROTID ANGIOGRAPHY N/A 07/19/2019   Procedure: CAROTID ANGIOGRAPHY;  Surgeon: Elam Dutch, MD;  Location: [place] CV LAB;  Service: Cardiovascular;  Laterality: N/A;   carotid endarterecotomy     CAROTID PTA/STENT INTERVENTION Left 08/02/2019   Procedure: CAROTID PTA/STENT INTERVENTION;  Surgeon: Elam Dutch, MD;  Location: [place] CV LAB;  Service: Cardiovascular;  Laterality: Left;   ENDARTERECTOMY Left 03/10/2021   Procedure: REDO LEFT CAROTID ENDARTERECTOMY WITH LEFT COMMON TO INTERNAL CAROTID ARTERY BYPASS;  Surgeon: Marty Heck, MD;  Location: [facility] OR;  Service: Vascular;  Laterality: Left;   external iliac     status post bilateral and left common iliac artery   INCISION AND DRAINAGE Left 03/31/2021   Procedure:  INCISION AND DRAINAGE OF LEFT THIGH WITH DRAIN PLACEMENT;  Surgeon: Marty Heck, MD;  Location: [place];  Service: Vascular;  Laterality: Left;   LIGATION OF ARTERIOVENOUS  FISTULA Left 03/10/2021   Procedure: LIGATION OF LEFT EXTERNAL CAROTID;  Surgeon: Marty Heck, MD;  Location: [place];  Service: Vascular;  Laterality: Left;   lumbar back surgery     x2   LUMBAR DISC SURGERY     x 2   stnt      lower extermity   TENDON REPAIR     to the left arm   TONSILLECTOMY     VEIN HARVEST Left 03/10/2021   Procedure: VEIN HARVEST FROM LEFT SAPHENOUS VEIN;  Surgeon: Marty Heck, MD;  Location: [facility] OR;  Service: Vascular;  Laterality: Left;    Current Outpatient Medications  Medication Sig Dispense Refill   albuterol (VENTOLIN HFA) 108 (90 Base) MCG/ACT inhaler Inhale 2 puffs into the lungs every 6 (six) hours as needed (wheezing/shortness of breath).      allopurinol (ZYLOPRIM) 300 MG tablet Take 300 mg by mouth daily.     amLODipine (NORVASC) 5 MG tablet Take 1 tablet (5 mg total) by mouth daily. 90 tablet 3   aspirin EC 81 MG tablet Take 1 tablet (81 mg total) by mouth daily. 90 tablet 3   azelastine (ASTELIN) 0.1 % nasal spray Place 1-2 sprays into both nostrils 2 (two) times daily as needed for rhinitis. Use in each nostril as directed     carvedilol (COREG) 12.5 MG tablet Take 1.5 tablets (18.75 mg total) by mouth 2 (two) times daily. 180 tablet 3   cephALEXin (KEFLEX) 500 MG capsule Take 500 mg by mouth every 4 (four) months.     chlorhexidine (PERIDEX) 0.12 % solution SMARTSIG:Topical     ELIQUIS 5 MG TABS tablet TAKE 1 TABLET(5 MG) BY MOUTH TWICE DAILY 180 tablet 3   furosemide (LASIX) 40 MG tablet Take 40 mg by mouth as needed for fluid (swelling).     gabapentin (NEURONTIN) 300 MG capsule Take 300 mg by mouth 2 (two) times daily.     ibuprofen (ADVIL) 200 MG tablet Take 200 mg by mouth every 6 (six) hours as needed for mild pain or moderate pain.     Magnesium Oxide 400 MG CAPS Take 1 capsule (400 mg total) by mouth in the morning and at bedtime. 180 capsule 3   omeprazole (PRILOSEC) 20 MG capsule Take 20 mg by mouth daily with breakfast.      oxyCODONE-acetaminophen (PERCOCET) 10-325 MG tablet Take 1 tablet by mouth every 6 (six) hours as needed for pain. 20 tablet 0   potassium chloride (KLOR-CON) 10 MEQ tablet TAKE 1 TABLET(10 MEQ) BY MOUTH DAILY 90 tablet 0    rosuvastatin (CRESTOR) 20 MG tablet Take 1 tablet (20 mg total) by mouth every evening. 30 tablet 0   TRELEGY ELLIPTA 100-62.5-25 MCG/INH AEPB Inhale 1 puff into the lungs at bedtime.      No current facility-administered medications for this encounter.    Allergies  Allergen Reactions   Plavix [Clopidogrel] Rash    Social History   Socioeconomic History   Marital status: Married    Spouse name: Vicky   Number of children: Not on file   Years of education: Not on file   Highest education level: Not on file  Occupational History   Not on file  Tobacco Use   Smoking status: Every Day    Packs/day: 0.40  Years: 50.00    Pack years: 20.00    Types: Cigarettes   Smokeless tobacco: Never   Tobacco comments:    17 cigarettes daily  Vaping Use   Vaping Use: Never used  Substance and Sexual Activity   Alcohol use: Yes    Alcohol/week: 3.0 standard drinks    Types: 3 Glasses of wine per week   Drug use: No   Sexual activity: Not on file  Other Topics Concern   Not on file  Social History Narrative   Not on file   Social Determinants of Health   Financial Resource Strain: Low Risk    Difficulty of Paying Living Expenses: Not hard at all  Food Insecurity: No Food Insecurity   Worried About Charity fundraiser in the Last Year: Never true   Ran Out of Food in the Last Year: Never true  Transportation Needs: No Transportation Needs   Lack of Transportation (Medical): No   Lack of Transportation (Non-Medical): No  Physical Activity: Not on file  Stress: No Stress Concern Present   Feeling of Stress : Not at all  Social Connections: Not on file  Intimate Partner Violence: Not At Risk   Fear of Current or Ex-Partner: No   Emotionally Abused: No   Physically Abused: No   Sexually Abused: No     ROS- All systems are reviewed and negative except as per the HPI above.  Physical Exam: Vitals:   05/19/21 1058  BP: 100/62  Pulse: 92  Weight: 98.8 kg  Height: '6\' 1"'$   (1.854 m)    GEN- The patient is a well appearing elderly male, alert and oriented x 3 today.   Head- normocephalic, atraumatic Eyes-  Sclera clear, conjunctiva pink Ears- hearing intact Oropharynx- clear Neck- supple  Lungs- Clear to ausculation bilaterally, normal work of breathing Heart- irregular rate and rhythm, no murmurs, rubs or gallops  GI- soft, NT, ND, + BS Extremities- no clubbing, cyanosis, or edema MS- no significant deformity or atrophy Skin- no rash or lesion Psych- euthymic mood, full affect Neuro- strength and sensation are intact  Wt Readings from Last 3 Encounters:  05/19/21 98.8 kg  05/18/21 98.4 kg  05/04/21 100.9 kg    EKG today demonstrates  Afib, LBBB Vent. rate 92 BPM PR interval * ms QRS duration 122 ms QT/QTcB 376/464 ms  Echo 03/10/21 demonstrated   1. Left ventricular ejection fraction, by estimation, is 55 to 60%. The  left ventricle has normal function. The left ventricle has no regional  wall motion abnormalities. There is mild left ventricular hypertrophy.  Left ventricular diastolic parameters  are consistent with Grade I diastolic dysfunction (impaired relaxation).   2. Right ventricular systolic function is normal. The right ventricular  size is normal.   3. The mitral valve is grossly normal. Trivial mitral valve  regurgitation.   4. The aortic valve is tricuspid. Aortic valve regurgitation is not  visualized.   5. The inferior vena cava is normal in size with <50% respiratory  variability, suggesting right atrial pressure of 8 mmHg.   Comparison(s): No significant change from prior study. 08/27/2019: LVEF  55-60%.   Epic records are reviewed at length today  CHA2DS2-VASc Score = 7  The patient's score is based upon: CHF History: Yes HTN History: Yes Diabetes History: No Stroke History: Yes Vascular Disease History: Yes Age Score: 2 Gender Score: 0      ASSESSMENT AND PLAN: 1. Persistent Atrial Fibrillation (ICD10:   I48.19) The  patient's CHA2DS2-VASc score is 7, indicating a 11.2% annual risk of stroke.   Patient remains in rate controlled afib with symptoms of fatigue and SOB.  Will arrange for DCCV, check bmet/cbc today.  D/w vascular team, no further surgical plans for now.  Continue carvedilol 18.75 mg BID until follow up. Continue Eliquis 5 mg BID  2. Secondary Hypercoagulable State (ICD10:  D68.69) The patient is at significant risk for stroke/thromboembolism based upon his CHA2DS2-VASc Score of 7.  Continue Apixaban (Eliquis).   3. HTN Stable, no changes today.  4. Carotid artery disease  S/p redo LCEA and L CCA bypass 03/2021.   Follow up in the AF clinic post DCCV.    [facility] [address] [place], [place] [address] [phone] 05/19/2021 11:08 AM

## 2021-05-19 NOTE — Patient Instructions (Signed)
Cardioversion scheduled for Wednesday, June 29th  - Arrive at the [facility] and go to admitting at 730AM  - Do not eat or drink anything after midnight the night prior to your procedure.  - Take all your morning medication (except diabetic medications) with a sip of water prior to arrival.  - You will not be able to drive home after your procedure.  - Do NOT miss any doses of your blood thinner - if you should miss a dose please notify our office immediately.  - If you feel as if you go back into normal rhythm prior to scheduled cardioversion, please notify our office immediately. If your procedure is canceled in the cardioversion suite you will be charged a cancellation fee.  Patients will be asked to: to mask in public and hand hygiene (no longer quarantine) in the 3 days prior to surgery, to report if any COVID-19-like illness or household contacts to COVID-19 to determine need for testing

## 2021-06-01 ENCOUNTER — Encounter (HOSPITAL_COMMUNITY): Payer: Self-pay | Admitting: Anesthesiology

## 2021-06-01 NOTE — Anesthesia Preprocedure Evaluation (Deleted)
Anesthesia Evaluation    Airway        Dental   Pulmonary COPD,  COPD inhaler, Current Smoker,           Cardiovascular hypertension, Pt. on medications and Pt. on home beta blockers + Peripheral Vascular Disease  + dysrhythmias Atrial Fibrillation      Neuro/Psych CVA, No Residual Symptoms negative psych ROS   GI/Hepatic Neg liver ROS, GERD  Medicated,  Endo/Other  negative endocrine ROS  Renal/GU CRFRenal disease  negative genitourinary   Musculoskeletal  (+) Arthritis ,   Abdominal   Peds  Hematology negative hematology ROS (+)   Anesthesia Other Findings   Reproductive/Obstetrics                             Anesthesia Physical Anesthesia Plan  ASA: 3  Anesthesia Plan: General   Post-op Pain Management:    Induction: Intravenous  PONV Risk Score and Plan: 1 and Propofol infusion and Treatment may vary due to age or medical condition  Airway Management Planned: Mask  Additional Equipment: None  Intra-op Plan:   Post-operative Plan:   Informed Consent:   Plan Discussed with:   Anesthesia Plan Comments: (ECHO 04/22:  1. Left ventricular ejection fraction, by estimation, is 55 to 60%. The  left ventricle has normal function. The left ventricle has no regional  wall motion abnormalities. There is mild left ventricular hypertrophy.  Left ventricular diastolic parameters  are consistent with Grade I diastolic dysfunction (impaired relaxation).  2. Right ventricular systolic function is normal. The right ventricular  size is normal.  3. The mitral valve is grossly normal. Trivial mitral valve  regurgitation.  4. The aortic valve is tricuspid. Aortic valve regurgitation is not  visualized.  5. The inferior vena cava is normal in size with <50% respiratory  variability, suggesting right atrial pressure of 8 mmHg.   Comparison(s): No significant change from prior study.  08/27/2019: LVEF  55-60%.  Lab Results      Component                Value               Date                      WBC                      10.6 (H)            05/19/2021                HGB                      11.2 (L)            05/19/2021                HCT                      34.6 (L)            05/19/2021                MCV                      93.5                05/19/2021  PLT                      164                 05/19/2021           Lab Results      Component                Value               Date                      NA                       133 (L)             05/19/2021                K                        5.2 (H)             05/19/2021                CO2                      23                  05/19/2021                GLUCOSE                  91                  05/19/2021                BUN                      43 (H)              05/19/2021                CREATININE               3.08 (H)            05/19/2021                CALCIUM                  8.7 (L)             05/19/2021                GFRNONAA                 20 (L)              05/19/2021                GFRAA                    33 (L)              03/31/2020          )       Anesthesia Quick Evaluation

## 2021-06-02 ENCOUNTER — Ambulatory Visit (HOSPITAL_COMMUNITY)
Admission: RE | Admit: 2021-06-02 | Discharge: 2021-06-02 | Disposition: A | Discharge status: Home / Self Care | Payer: Medicare Other | Source: Ambulatory Visit | Attending: Cardiovascular Disease | Admitting: Cardiovascular Disease

## 2021-06-02 ENCOUNTER — Encounter (HOSPITAL_COMMUNITY)
Admission: RE | Disposition: A | Discharge status: Home / Self Care | Payer: Self-pay | Source: Ambulatory Visit | Attending: Cardiovascular Disease

## 2021-06-02 DIAGNOSIS — Z5309 Procedure and treatment not carried out because of other contraindication: Secondary | ICD-10-CM | POA: Insufficient documentation

## 2021-06-02 DIAGNOSIS — I4891 Unspecified atrial fibrillation: Secondary | ICD-10-CM | POA: Diagnosis present

## 2021-06-02 DIAGNOSIS — I4819 Other persistent atrial fibrillation: Secondary | ICD-10-CM

## 2021-06-02 SURGERY — CANCELLED PROCEDURE

## 2021-06-02 NOTE — H&P (Signed)
Procedure cancelled. He reports a missed dose of eliquis last week. He reports he does not want to do a TEE. He will need to reschedule.   [place] T. Audie Box, MD, [place]  [address] [place], [place] [address] [phone]  8:14 AM

## 2021-06-02 NOTE — Progress Notes (Signed)
Cardioversion cancelled due to patient missing a dose of his blood thinner per Dr. Audie Box.

## 2021-06-04 ENCOUNTER — Other Ambulatory Visit (HOSPITAL_COMMUNITY): Payer: Self-pay | Admitting: *Deleted

## 2021-06-08 ENCOUNTER — Ambulatory Visit (HOSPITAL_COMMUNITY)
Admission: RE | Admit: 2021-06-08 | Discharge: 2021-06-08 | Disposition: A | Discharge status: Home / Self Care | Payer: Medicare Other | Source: Ambulatory Visit | Attending: Physician Assistant | Admitting: Physician Assistant

## 2021-06-08 ENCOUNTER — Ambulatory Visit (HOSPITAL_COMMUNITY): Payer: Medicare Other | Admitting: Physician Assistant

## 2021-06-08 ENCOUNTER — Ambulatory Visit (INDEPENDENT_AMBULATORY_CARE_PROVIDER_SITE_OTHER): Payer: Medicare Other | Admitting: Physician Assistant

## 2021-06-08 ENCOUNTER — Other Ambulatory Visit: Payer: Self-pay

## 2021-06-08 VITALS — BP 125/73 | HR 79 | Resp 16 | Ht 73.0 in | Wt 230.7 lb

## 2021-06-08 DIAGNOSIS — M26622 Arthralgia of left temporomandibular joint: Secondary | ICD-10-CM

## 2021-06-08 DIAGNOSIS — Z9889 Other specified postprocedural states: Secondary | ICD-10-CM

## 2021-06-08 DIAGNOSIS — I6523 Occlusion and stenosis of bilateral carotid arteries: Secondary | ICD-10-CM | POA: Insufficient documentation

## 2021-06-08 DIAGNOSIS — L7634 Postprocedural seroma of skin and subcutaneous tissue following other procedure: Secondary | ICD-10-CM

## 2021-06-08 DIAGNOSIS — I6522 Occlusion and stenosis of left carotid artery: Secondary | ICD-10-CM

## 2021-06-08 MED ORDER — BACLOFEN 10 MG PO TABS: 5.0000 mg | ORAL_TABLET | Freq: Three times a day (TID) | ORAL | 0 refills | Status: DC

## 2021-06-08 NOTE — Progress Notes (Signed)
Carotid Artery Follow-Up   VASCULAR SURGERY ASSESSMENT & PLAN:   [name] is a 75 y.o. male redo left CEA with harvest of left GSV, ligation of left external carotid artery, left CCA to ICA bypass with GSV on 03/10/2021 by Dr. Carlis Abbott.  He subsequently underwent  I&D of left thigh seroma at saphenectomy site and placement of JP drain on 03/31/2021 by Dr. Carlis Abbott.    Bilateral carotid artery stenosis: The patient has no symptoms referable to carotid artery stenosis.  Duplex examination today shows patent bypass graft without stenosis.  We reviewed the signs and symptoms of stroke/TIA and advised the patient to call EMS should these occur.    Left TMJ pain. Will prescribe Baclofen to use in place of Percocet. May need ENT/oral surgery referral if persists.  Continue optimal medical management of hypertension and follow-up with primary care physician. History of a. fib on Eliquis Encouraged complete smoking cessation. Continue the following medications: aspirin, statin, Plavix Follow-up in 9 months with carotid duplex ultrasound.  SUBJECTIVE:   The patient denies monocular blindness, slurred speech, facial drooping, extremity weakness or numbness. His wife accompanies him today. He reports no further drainage from thigh incision.  He is complaining of left upper jaw pain when opening his mouth and is requiring pain pill prior to eating.  He is scheduled for cardioversion later this month  PHYSICAL EXAM:   Vitals:   06/08/21 1335  BP: 125/73  Pulse: 79  Resp: 16  SpO2: 94%  Weight: 230 lb 11.2 oz (104.6 kg)  Height: '6\' 1"'$  (1.854 m)    General appearance: Well-developed, well-nourished in no apparent distress Neurologic: Alert and oriented x4, tongue is midline, face symmetric, speech fluent, 5 out of 5 bilateral upper extremity grip strength, triceps and biceps strength Cardiovascular: Heart rate and rhythm are regular.  No carotid bruits. Respirations: Non-labored. Mild expiratory  wheezes. Left thigh: incision without drainage; small crust at distal aspect   NON-INVASIVE VASCULAR STUDIES   06/08/2021 Right Carotid: Velocities in the right ICA are consistent with a 1-39% stenosis.   Left Carotid: Patent CCA to ICA bypass graft with no stenosis seen. ECA ligated.   Vertebrals:  Bilateral vertebral arteries demonstrate antegrade flow.   Subclavians: Normal flow hemodynamics were seen in bilateral subclavian arteries.    PROBLEM LIST:    The patient's past medical history, past surgical history, family history, social history, allergy list and medication list are reviewed.   CURRENT MEDS:    Current Outpatient Medications:    albuterol (VENTOLIN HFA) 108 (90 Base) MCG/ACT inhaler, Inhale 2 puffs into the lungs every 6 (six) hours as needed for shortness of breath or wheezing., Disp: , Rfl:    allopurinol (ZYLOPRIM) 300 MG tablet, Take 300 mg by mouth daily., Disp: , Rfl:    amLODipine (NORVASC) 5 MG tablet, Take 1 tablet (5 mg total) by mouth daily. (Patient taking differently: Take 10 mg by mouth daily.), Disp: 90 tablet, Rfl: 3   aspirin EC 81 MG tablet, Take 1 tablet (81 mg total) by mouth daily., Disp: 90 tablet, Rfl: 3   azelastine (ASTELIN) 0.1 % nasal spray, Place 1-2 sprays into both nostrils 2 (two) times daily as needed for rhinitis. Use in each nostril as directed, Disp: , Rfl:    carvedilol (COREG) 12.5 MG tablet, Take 1.5 tablets (18.75 mg total) by mouth 2 (two) times daily., Disp: 180 tablet, Rfl: 3   ELIQUIS 5 MG TABS tablet, TAKE 1 TABLET(5 MG) BY MOUTH  TWICE DAILY (Patient taking differently: Take 5 mg by mouth 2 (two) times daily.), Disp: 180 tablet, Rfl: 3   furosemide (LASIX) 40 MG tablet, Take 40 mg by mouth daily as needed for fluid (swelling)., Disp: , Rfl:    gabapentin (NEURONTIN) 300 MG capsule, Take 300 mg by mouth 2 (two) times daily., Disp: , Rfl:    ibuprofen (ADVIL) 200 MG tablet, Take 200 mg by mouth every 6 (six) hours as needed  for mild pain or moderate pain., Disp: , Rfl:    losartan-hydrochlorothiazide (HYZAAR) 50-12.5 MG tablet, Take 1 tablet by mouth daily., Disp: , Rfl:    Magnesium Oxide 400 MG CAPS, Take 1 capsule (400 mg total) by mouth in the morning and at bedtime. (Patient taking differently: Take 400 mg by mouth in the morning and at bedtime.), Disp: 180 capsule, Rfl: 3   omeprazole (PRILOSEC) 20 MG capsule, Take 20 mg by mouth daily with breakfast. , Disp: , Rfl:    oxyCODONE-acetaminophen (PERCOCET) 7.5-325 MG tablet, Take 1 tablet by mouth every 8 (eight) hours as needed., Disp: , Rfl:    potassium chloride (KLOR-CON) 10 MEQ tablet, TAKE 1 TABLET(10 MEQ) BY MOUTH DAILY (Patient taking differently: Take 10 mEq by mouth daily.), Disp: 90 tablet, Rfl: 0   rosuvastatin (CRESTOR) 20 MG tablet, Take 1 tablet (20 mg total) by mouth every evening., Disp: 30 tablet, Rfl: 0   TRELEGY ELLIPTA 100-62.5-25 MCG/INH AEPB, Inhale 1 puff into the lungs at bedtime. , Disp: , Rfl:    REVIEW OF SYSTEMS:   '[X]'$  denotes positive finding, '[ ]'$  denotes negative finding Cardiac  Comments:  Chest pain or chest pressure:    Shortness of breath upon exertion:    Short of breath when lying flat:    Irregular heart rhythm:        Vascular    Pain in calf, thigh, or hip brought on by ambulation:    Pain in feet at night that wakes you up from your sleep:     Blood clot in your veins:    Leg swelling:         Pulmonary    Oxygen at home:    Productive cough:     Wheezing:         Neurologic    Sudden weakness in arms or legs:     Sudden numbness in arms or legs:     Sudden onset of difficulty speaking or slurred speech:    Temporary loss of vision in one eye:     Problems with dizziness:         Gastrointestinal    Blood in stool:     Vomited blood:         Genitourinary    Burning when urinating:     Blood in urine:        Psychiatric    Major depression:         Hematologic    Bleeding problems:    Problems  with blood clotting too easily:        Skin    Rashes or ulcers:        Constitutional    Fever or chills:     Barbie Banner, PA-C  Office: [phone] 06/08/2021

## 2021-06-10 ENCOUNTER — Other Ambulatory Visit: Payer: Self-pay

## 2021-06-10 DIAGNOSIS — I6522 Occlusion and stenosis of left carotid artery: Secondary | ICD-10-CM

## 2021-06-22 ENCOUNTER — Other Ambulatory Visit: Payer: Self-pay

## 2021-06-22 ENCOUNTER — Ambulatory Visit (HOSPITAL_BASED_OUTPATIENT_CLINIC_OR_DEPARTMENT_OTHER)
Admission: RE | Admit: 2021-06-22 | Discharge: 2021-06-22 | Disposition: A | Discharge status: Home / Self Care | Payer: Medicare Other | Source: Ambulatory Visit | Attending: Physician Assistant | Admitting: Physician Assistant

## 2021-06-22 ENCOUNTER — Ambulatory Visit (HOSPITAL_COMMUNITY): Payer: Medicare Other | Admitting: Anesthesiology

## 2021-06-22 ENCOUNTER — Ambulatory Visit (HOSPITAL_COMMUNITY)
Admission: RE | Admit: 2021-06-22 | Discharge: 2021-06-22 | Disposition: A | Discharge status: Home / Self Care | Payer: Medicare Other | Source: Ambulatory Visit | Attending: Cardiology | Admitting: Cardiology

## 2021-06-22 ENCOUNTER — Encounter (HOSPITAL_COMMUNITY): Payer: Self-pay | Admitting: Cardiology

## 2021-06-22 ENCOUNTER — Encounter (HOSPITAL_COMMUNITY): Payer: Self-pay | Admitting: Physician Assistant

## 2021-06-22 ENCOUNTER — Encounter (HOSPITAL_COMMUNITY)
Admission: RE | Disposition: A | Discharge status: Home / Self Care | Payer: Self-pay | Source: Ambulatory Visit | Attending: Cardiology

## 2021-06-22 VITALS — BP 118/62 | HR 86 | Ht 73.0 in | Wt 220.6 lb

## 2021-06-22 DIAGNOSIS — Z8249 Family history of ischemic heart disease and other diseases of the circulatory system: Secondary | ICD-10-CM | POA: Insufficient documentation

## 2021-06-22 DIAGNOSIS — I130 Hypertensive heart and chronic kidney disease with heart failure and stage 1 through stage 4 chronic kidney disease, or unspecified chronic kidney disease: Secondary | ICD-10-CM | POA: Diagnosis not present

## 2021-06-22 DIAGNOSIS — F17210 Nicotine dependence, cigarettes, uncomplicated: Secondary | ICD-10-CM | POA: Insufficient documentation

## 2021-06-22 DIAGNOSIS — Z79899 Other long term (current) drug therapy: Secondary | ICD-10-CM | POA: Insufficient documentation

## 2021-06-22 DIAGNOSIS — Z7982 Long term (current) use of aspirin: Secondary | ICD-10-CM | POA: Insufficient documentation

## 2021-06-22 DIAGNOSIS — J449 Chronic obstructive pulmonary disease, unspecified: Secondary | ICD-10-CM | POA: Insufficient documentation

## 2021-06-22 DIAGNOSIS — I4891 Unspecified atrial fibrillation: Secondary | ICD-10-CM | POA: Diagnosis present

## 2021-06-22 DIAGNOSIS — I5032 Chronic diastolic (congestive) heart failure: Secondary | ICD-10-CM | POA: Diagnosis not present

## 2021-06-22 DIAGNOSIS — Z7901 Long term (current) use of anticoagulants: Secondary | ICD-10-CM | POA: Insufficient documentation

## 2021-06-22 DIAGNOSIS — D6869 Other thrombophilia: Secondary | ICD-10-CM

## 2021-06-22 DIAGNOSIS — I4819 Other persistent atrial fibrillation: Secondary | ICD-10-CM

## 2021-06-22 DIAGNOSIS — Z888 Allergy status to other drugs, medicaments and biological substances status: Secondary | ICD-10-CM | POA: Insufficient documentation

## 2021-06-22 DIAGNOSIS — Z7951 Long term (current) use of inhaled steroids: Secondary | ICD-10-CM | POA: Diagnosis not present

## 2021-06-22 DIAGNOSIS — N183 Chronic kidney disease, stage 3 unspecified: Secondary | ICD-10-CM | POA: Diagnosis not present

## 2021-06-22 HISTORY — PX: CARDIOVERSION: SHX1299

## 2021-06-22 LAB — BASIC METABOLIC PANEL
Anion gap: 11 (ref 5–15)
BUN: 42 mg/dL — ABNORMAL HIGH (ref 8–23)
CO2: 24 mmol/L (ref 22–32)
Calcium: 9.1 mg/dL (ref 8.9–10.3)
Chloride: 102 mmol/L (ref 98–111)
Creatinine, Ser: 3.14 mg/dL — ABNORMAL HIGH (ref 0.61–1.24)
GFR, Estimated: 20 mL/min — ABNORMAL LOW (ref >60)
Glucose, Bld: 87 mg/dL (ref 70–99)
Potassium: 4.3 mmol/L (ref 3.5–5.1)
Sodium: 137 mmol/L (ref 135–145)

## 2021-06-22 LAB — CBC
HCT: 37.8 % — ABNORMAL LOW (ref 39.0–52.0)
Hemoglobin: 11.7 g/dL — ABNORMAL LOW (ref 13.0–17.0)
MCH: 29.1 pg (ref 26.0–34.0)
MCHC: 31 g/dL (ref 30.0–36.0)
MCV: 94 fL (ref 80.0–100.0)
Platelets: 174 10*3/uL (ref 150–400)
RBC: 4.02 MIL/uL — ABNORMAL LOW (ref 4.22–5.81)
RDW: 15.1 % (ref 11.5–15.5)
WBC: 9.1 10*3/uL (ref 4.0–10.5)
nRBC: 0 % (ref 0.0–0.2)

## 2021-06-22 SURGERY — CARDIOVERSION
Anesthesia: General

## 2021-06-22 MED ORDER — SODIUM CHLORIDE 0.9 % IV SOLN: INTRAVENOUS | Status: DC | PRN

## 2021-06-22 MED ORDER — PROPOFOL 10 MG/ML IV BOLUS
INTRAVENOUS | Status: DC | PRN
  Administered 2021-06-22: 50 mg via INTRAVENOUS

## 2021-06-22 MED ORDER — LIDOCAINE 2% (20 MG/ML) 5 ML SYRINGE
INTRAMUSCULAR | Status: DC | PRN
  Administered 2021-06-22: 80 mg via INTRAVENOUS

## 2021-06-22 NOTE — Anesthesia Preprocedure Evaluation (Signed)
Anesthesia Evaluation  Patient identified by MRN, date of birth, ID band Patient awake    Reviewed: Allergy & Precautions, H&P , NPO status , Patient's Chart, lab work & pertinent test results  Airway Mallampati: II   Neck ROM: full    Dental   Pulmonary COPD, Current Smoker,    breath sounds clear to auscultation       Cardiovascular hypertension, + Peripheral Vascular Disease  + dysrhythmias Atrial Fibrillation  Rhythm:irregular Rate:Normal     Neuro/Psych  Neuromuscular disease CVA    GI/Hepatic GERD  ,  Endo/Other    Renal/GU Renal InsufficiencyRenal disease     Musculoskeletal  (+) Arthritis ,   Abdominal   Peds  Hematology   Anesthesia Other Findings   Reproductive/Obstetrics                             Anesthesia Physical Anesthesia Plan  ASA: 3  Anesthesia Plan: General   Post-op Pain Management:    Induction: Intravenous  PONV Risk Score and Plan: 1 and Propofol infusion and Treatment may vary due to age or medical condition  Airway Management Planned: Mask  Additional Equipment:   Intra-op Plan:   Post-operative Plan:   Informed Consent: I have reviewed the patients History and Physical, chart, labs and discussed the procedure including the risks, benefits and alternatives for the proposed anesthesia with the patient or authorized representative who has indicated his/her understanding and acceptance.     Dental advisory given  Plan Discussed with: CRNA and Anesthesiologist  Anesthesia Plan Comments:         Anesthesia Quick Evaluation

## 2021-06-22 NOTE — Progress Notes (Signed)
Primary Care Physician: Raelene Bott, MD Primary Cardiologist: Dr Burt Knack Primary Electrophysiologist: none Referring Physician: [name] is a 75 y.o. male with a history of chronic diastolic CHF, HTN, CKD, bilateral carotid stenosis, atrial fibrillation who presents for follow up in the [facility]. Patient is on Eliquis for a CHADS2VASC score of 4. Patient seen by [name] 05/04/21 and found to be back in afib. He does have symptoms of fatigue and SOB. He had redo LCEA and L CCA bypass on 4/6/222 and had a seroma on his thigh which is healing, no interventions planned.   On follow up today, patient was scheduled for DCCV on 06/02/21 but he had missed a dose of Eliquis and this was rescheduled. Plan for DCCV today. He denies any missed doses of Eliquis in the past 3 weeks. He remains symptomatic with fatigue, SOB, and intermittent lightheadedness.   Today, he denies symptoms of palpitations, chest pain, orthopnea, PND, lower extremity edema, dizziness, presyncope, syncope, snoring, daytime somnolence, bleeding, or neurologic sequela. The patient is tolerating medications without difficulties and is otherwise without complaint today.    Atrial Fibrillation Risk Factors:  he does not have symptoms or diagnosis of sleep apnea. he does not have a history of rheumatic fever.   he has a BMI of Body mass index is 29.1 kg/m.[name] Filed Weights   06/22/21 1026  Weight: 100.1 kg     Family History  Problem Relation Age of Onset   Hypertension Mother    Hyperlipidemia Mother      Atrial Fibrillation Management history:  Previous antiarrhythmic drugs: none Previous cardioversions: 11/2019 Previous ablations: none CHADS2VASC score: 7 Anticoagulation history: Eliquis   Past Medical History:  Diagnosis Date   Arthritis    CKD (chronic kidney disease), stage III (HCC)    COPD (chronic obstructive pulmonary disease) (HCC)    Dyspnea  on exertion    Edema    HTN (hypertension)    Hyperlipidemia    Left carotid stenosis    a. 2007 s/p L CEA;  b. 2011 s/p L common carotid stenting 2/2 restenosis;  c. [id number] u/s: RICA [id number], LICA [id number], patent LCCA stent.   PAD (peripheral artery disease) (HCC)    a. s/p prior RSFA, REIA, distal LSFA (known to be occluded), LEIA, and LCIA stenting;  b. 01/2015 ABI's: R 0.78, L 0.53.   Stroke [facility]) 03/2021   no residual    Past Surgical History:  Procedure Laterality Date   ABDOMINAL AORTAGRAM N/A 02/05/2015   Procedure: ABDOMINAL AORTAGRAM;  Surgeon: Conrad Montebello, MD;  Location: [facility] CATH LAB;  Service: Cardiovascular;  Laterality: N/A;   AORTIC ARCH ANGIOGRAPHY N/A 07/19/2019   Procedure: AORTIC ARCH ANGIOGRAPHY;  Surgeon: Elam Dutch, MD;  Location: [place] CV LAB;  Service: Cardiovascular;  Laterality: N/A;   CARDIOVERSION N/A 11/06/2019   Procedure: CARDIOVERSION;  Surgeon: Pixie Casino, MD;  Location: [place];  Service: Cardiovascular;  Laterality: N/A;   CAROTID ANGIOGRAPHY N/A 07/19/2019   Procedure: CAROTID ANGIOGRAPHY;  Surgeon: Elam Dutch, MD;  Location: [place] CV LAB;  Service: Cardiovascular;  Laterality: N/A;   carotid endarterecotomy     CAROTID PTA/STENT INTERVENTION Left 08/02/2019   Procedure: CAROTID PTA/STENT INTERVENTION;  Surgeon: Elam Dutch, MD;  Location: [place] CV LAB;  Service: Cardiovascular;  Laterality: Left;   ENDARTERECTOMY Left 03/10/2021   Procedure: REDO LEFT CAROTID ENDARTERECTOMY WITH LEFT COMMON TO INTERNAL CAROTID ARTERY  BYPASS;  Surgeon: Marty Heck, MD;  Location: [facility] OR;  Service: Vascular;  Laterality: Left;   external iliac     status post bilateral and left common iliac artery   INCISION AND DRAINAGE Left 03/31/2021   Procedure: INCISION AND DRAINAGE OF LEFT THIGH WITH DRAIN PLACEMENT;  Surgeon: Marty Heck, MD;  Location: [place];  Service: Vascular;  Laterality: Left;   LIGATION OF ARTERIOVENOUS   FISTULA Left 03/10/2021   Procedure: LIGATION OF LEFT EXTERNAL CAROTID;  Surgeon: Marty Heck, MD;  Location: [place];  Service: Vascular;  Laterality: Left;   lumbar back surgery     x2   LUMBAR DISC SURGERY     x 2   stnt     lower extermity   TENDON REPAIR     to the left arm   TONSILLECTOMY     VEIN HARVEST Left 03/10/2021   Procedure: VEIN HARVEST FROM LEFT SAPHENOUS VEIN;  Surgeon: Marty Heck, MD;  Location: [facility] OR;  Service: Vascular;  Laterality: Left;    Current Outpatient Medications  Medication Sig Dispense Refill   albuterol (VENTOLIN HFA) 108 (90 Base) MCG/ACT inhaler Inhale 1-2 puffs into the lungs every 6 (six) hours as needed for shortness of breath or wheezing.     allopurinol (ZYLOPRIM) 300 MG tablet Take 300 mg by mouth daily.     amLODipine (NORVASC) 10 MG tablet Take 10 mg by mouth daily.     amLODipine (NORVASC) 5 MG tablet Take 1 tablet (5 mg total) by mouth daily. 90 tablet 3   aspirin EC 81 MG tablet Take 1 tablet (81 mg total) by mouth daily. 90 tablet 3   azelastine (ASTELIN) 0.1 % nasal spray Place 1-2 sprays into both nostrils 2 (two) times daily as needed for rhinitis. Use in each nostril as directed     baclofen (LIORESAL) 10 MG tablet Take 0.5 tablets (5 mg total) by mouth 3 (three) times daily. 30 each 0   carvedilol (COREG) 12.5 MG tablet Take 1.5 tablets (18.75 mg total) by mouth 2 (two) times daily. (Patient taking differently: Take 12.5 mg by mouth 2 (two) times daily.) 180 tablet 3   diphenhydrAMINE (BENADRYL) 25 MG tablet Take 25 mg by mouth every 6 (six) hours as needed for allergies.     ELIQUIS 5 MG TABS tablet TAKE 1 TABLET(5 MG) BY MOUTH TWICE DAILY (Patient taking differently: Take 5 mg by mouth 2 (two) times daily.) 180 tablet 3   furosemide (LASIX) 40 MG tablet Take 40 mg by mouth 2 (two) times daily.     gabapentin (NEURONTIN) 300 MG capsule Take 300 mg by mouth 2 (two) times daily.     ibuprofen (ADVIL) 200 MG tablet Take 200  mg by mouth every 6 (six) hours as needed for mild pain or moderate pain.     losartan-hydrochlorothiazide (HYZAAR) 50-12.5 MG tablet Take 1 tablet by mouth daily.     Magnesium Oxide 400 MG CAPS Take 1 capsule (400 mg total) by mouth in the morning and at bedtime. (Patient taking differently: Take 400 mg by mouth in the morning and at bedtime.) 180 capsule 3   omeprazole (PRILOSEC) 20 MG capsule Take 20 mg by mouth daily with breakfast.      oxyCODONE-acetaminophen (PERCOCET) 7.5-325 MG tablet Take 0.5 tablets by mouth every 4 (four) hours as needed for moderate pain.     potassium chloride (KLOR-CON) 10 MEQ tablet TAKE 1 TABLET(10 MEQ) BY MOUTH DAILY (Patient  taking differently: Take 10 mEq by mouth daily.) 90 tablet 0   rosuvastatin (CRESTOR) 10 MG tablet Take 10 mg by mouth daily.     rosuvastatin (CRESTOR) 20 MG tablet Take 1 tablet (20 mg total) by mouth every evening. 30 tablet 0   TRELEGY ELLIPTA 100-62.5-25 MCG/INH AEPB Inhale 1 puff into the lungs at bedtime.      No current facility-administered medications for this encounter.    Allergies  Allergen Reactions   Plavix [Clopidogrel] Rash    Social History   Socioeconomic History   Marital status: Married    Spouse name: Vicky   Number of children: Not on file   Years of education: Not on file   Highest education level: Not on file  Occupational History   Not on file  Tobacco Use   Smoking status: Every Day    Packs/day: 0.40    Years: 50.00    Pack years: 20.00    Types: Cigarettes   Smokeless tobacco: Never   Tobacco comments:    17 cigarettes daily  Vaping Use   Vaping Use: Never used  Substance and Sexual Activity   Alcohol use: Yes    Alcohol/week: 3.0 standard drinks    Types: 3 Glasses of wine per week   Drug use: No   Sexual activity: Not on file  Other Topics Concern   Not on file  Social History Narrative   Not on file   Social Determinants of Health   Financial Resource Strain: Low Risk     Difficulty of Paying Living Expenses: Not hard at all  Food Insecurity: No Food Insecurity   Worried About Charity fundraiser in the Last Year: Never true   Ran Out of Food in the Last Year: Never true  Transportation Needs: No Transportation Needs   Lack of Transportation (Medical): No   Lack of Transportation (Non-Medical): No  Physical Activity: Not on file  Stress: No Stress Concern Present   Feeling of Stress : Not at all  Social Connections: Not on file  Intimate Partner Violence: Not At Risk   Fear of Current or Ex-Partner: No   Emotionally Abused: No   Physically Abused: No   Sexually Abused: No     ROS- All systems are reviewed and negative except as per the HPI above.  Physical Exam: Vitals:   06/22/21 1026  BP: 118/62  Pulse: 86  Weight: 100.1 kg  Height: '6\' 1"'$  (1.854 m)    GEN- The patient is a well appearing elderly male, alert and oriented x 3 today.   HEENT-head normocephalic, atraumatic, sclera clear, conjunctiva pink, hearing intact, trachea midline. Lungs- Clear to ausculation bilaterally, normal work of breathing Heart- irregular rate and rhythm, no murmurs, rubs or gallops  GI- soft, NT, ND, + BS Extremities- no clubbing, cyanosis, or edema MS- no significant deformity or atrophy Skin- no rash or lesion Psych- euthymic mood, full affect Neuro- strength and sensation are intact   Wt Readings from Last 3 Encounters:  06/22/21 100.1 kg  06/08/21 104.6 kg  05/19/21 98.8 kg    EKG today demonstrates  Afib Vent. rate 86 BPM PR interval * ms QRS duration 88 ms QT/QTcB 366/437 ms  Echo 03/10/21 demonstrated   1. Left ventricular ejection fraction, by estimation, is 55 to 60%. The  left ventricle has normal function. The left ventricle has no regional  wall motion abnormalities. There is mild left ventricular hypertrophy.  Left ventricular diastolic parameters  are consistent  with Grade I diastolic dysfunction (impaired relaxation).   2. Right  ventricular systolic function is normal. The right ventricular  size is normal.   3. The mitral valve is grossly normal. Trivial mitral valve  regurgitation.   4. The aortic valve is tricuspid. Aortic valve regurgitation is not  visualized.   5. The inferior vena cava is normal in size with <50% respiratory  variability, suggesting right atrial pressure of 8 mmHg.   Comparison(s): No significant change from prior study. 08/27/2019: LVEF  55-60%.   Epic records are reviewed at length today  CHA2DS2-VASc Score = 7  The patient's score is based upon: CHF History: Yes HTN History: Yes Diabetes History: No Stroke History: Yes Vascular Disease History: Yes Age Score: 2 Gender Score: 0      ASSESSMENT AND PLAN: 1. Persistent Atrial Fibrillation (ICD10:  I48.19) The patient's CHA2DS2-VASc score is 7, indicating a 11.2% annual risk of stroke.   DCCV rescheduled for today due to missed dose of Eliquis. Check bmet/cbc Continue carvedilol 18.75 mg BID until follow up. Continue Eliquis 5 mg BID  2. Secondary Hypercoagulable State (ICD10:  D68.69) The patient is at significant risk for stroke/thromboembolism based upon his CHA2DS2-VASc Score of 7.  Continue Apixaban (Eliquis).   3. HTN Stable, no changes today.  4. Carotid artery disease  S/p redo LCEA and L CCA bypass 03/2021.   Follow up in the AF clinic post DCCV.    [facility] [address] [place], [place] [address] [phone] 06/22/2021 10:36 AM

## 2021-06-22 NOTE — Interval H&P Note (Signed)
History and Physical Interval Note:  06/22/2021 10:55 AM  [name]  has presented today for surgery, with the diagnosis of AFIB.  The various methods of treatment have been discussed with the patient and family. After consideration of risks, benefits and other options for treatment, the patient has consented to  Procedure(s): CARDIOVERSION (N/A) as a surgical intervention.  The patient's history has been reviewed, patient examined, no change in status, stable for surgery.  I have reviewed the patient's chart and labs.  Questions were answered to the patient's satisfaction.     [name]

## 2021-06-22 NOTE — H&P (Signed)
ATRIAL FIB OFFICE VISIT  06/22/2021 [place] [facility]  Fenton, Rochelle R, PA  Cardiology Persistent atrial fibrillation ([place]) +1 more  Dx Referred by [name] Bott, MD  Reason for Visit    Additional Documentation  Vitals:  BP 118/62 Pulse 86 Ht '6\' 1"'$  (1.854 m) Wt 100.1 kg BMI 29.10 kg/m BSA 2.27 m  Flowsheets:  NEWS, MEWS Score, Anthropometrics, Method of Visit   Encounter Info:  Billing Info, History, Allergies, Detailed Report    All Notes    Progress Notes by Oliver Barre, PA at 06/22/2021 10:00 AM  Author: Oliver Barre, PA Author Type: Physician Assistant Filed: 06/22/2021 10:44 AM  Note Status: Signed Cosign: Cosign Not Required Date of Service: 06/22/2021 10:00 AM  Editor: Oliver Barre, PA (Physician Assistant)                    Primary Care Physician: [name] Bott, MD Primary Cardiologist: Dr Burt Knack Primary Electrophysiologist: none Referring Physician: Andres Labrum [name] is a 75 y.o. male with a history of chronic diastolic CHF, HTN, CKD, bilateral carotid stenosis, atrial fibrillation who presents for follow up in the [facility]. Patient is on Eliquis for a CHADS2VASC score of 4. Patient seen by [name] 05/04/21 and found to be back in afib. He does have symptoms of fatigue and SOB. He had redo LCEA and L CCA bypass on 4/6/222 and had a seroma on his thigh which is healing, no interventions planned.   On follow up today, patient was scheduled for DCCV on 06/02/21 but he had missed a dose of Eliquis and this was rescheduled. Plan for DCCV today. He denies any missed doses of Eliquis in the past 3 weeks. He remains symptomatic with fatigue, SOB, and intermittent lightheadedness.    Today, he denies symptoms of palpitations, chest pain, orthopnea, PND, lower extremity edema, dizziness, presyncope, syncope, snoring, daytime somnolence, bleeding, or neurologic sequela. The patient is  tolerating medications without difficulties and is otherwise without complaint today.     Atrial Fibrillation Risk Factors:   he does not have symptoms or diagnosis of sleep apnea. he does not have a history of rheumatic fever.     he has a BMI of Body mass index is 29.1 kg/m.[name]    Filed Weights    06/22/21 1026  Weight: 100.1 kg             Family History  Problem Relation Age of Onset   Hypertension Mother     Hyperlipidemia Mother          Atrial Fibrillation Management history:   Previous antiarrhythmic drugs: none Previous cardioversions: 11/2019 Previous ablations: none CHADS2VASC score: 7 Anticoagulation history: Eliquis         Past Medical History:  Diagnosis Date   Arthritis     CKD (chronic kidney disease), stage III (HCC)     COPD (chronic obstructive pulmonary disease) (HCC)     Dyspnea on exertion     Edema     HTN (hypertension)     Hyperlipidemia     Left carotid stenosis      a. 2007 s/p L CEA;  b. 2011 s/p L common carotid stenting 2/2 restenosis;  c. [id number] u/s: RICA [id number], LICA [id number], patent LCCA stent.   PAD (peripheral artery disease) ([place])      a. s/p prior RSFA, REIA, distal LSFA (known to be occluded), LEIA, and LCIA stenting;  b. 01/2015 ABI's: R 0.78, L 0.53.   Stroke [facility]) 03/2021    no residual          Past Surgical History:  Procedure Laterality Date   ABDOMINAL AORTAGRAM N/A 02/05/2015    Procedure: ABDOMINAL AORTAGRAM;  Surgeon: Conrad Needles, MD;  Location[facility] CATH LAB;  Service: Cardiovascular;  Laterality: N/A;   AORTIC ARCH ANGIOGRAPHY N/A 07/19/2019    Procedure: AORTIC ARCH ANGIOGRAPHY;  Surgeon: Elam Dutch, MD;  Location: [place] CV LAB;  Service: Cardiovascular;  Laterality: N/A;   CARDIOVERSION N/A 11/06/2019    Procedure: CARDIOVERSION;  Surgeon: Pixie Casino, MD;  Location: [place];  Service: Cardiovascular;  Laterality: N/A;   CAROTID ANGIOGRAPHY N/A 07/19/2019    Procedure: CAROTID ANGIOGRAPHY;   Surgeon: Elam Dutch, MD;  Location: [place] CV LAB;  Service: Cardiovascular;  Laterality: N/A;   carotid endarterecotomy       CAROTID PTA/STENT INTERVENTION Left 08/02/2019    Procedure: CAROTID PTA/STENT INTERVENTION;  Surgeon: Elam Dutch, MD;  Location: [place] CV LAB;  Service: Cardiovascular;  Laterality: Left;   ENDARTERECTOMY Left 03/10/2021    Procedure: REDO LEFT CAROTID ENDARTERECTOMY WITH LEFT COMMON TO INTERNAL CAROTID ARTERY BYPASS;  Surgeon: Marty Heck, MD;  Location: [place];  Service: Vascular;  Laterality: Left;   external iliac        status post bilateral and left common iliac artery   INCISION AND DRAINAGE Left 03/31/2021    Procedure: INCISION AND DRAINAGE OF LEFT THIGH WITH DRAIN PLACEMENT;  Surgeon: Marty Heck, MD;  Location: [place];  Service: Vascular;  Laterality: Left;   LIGATION OF ARTERIOVENOUS  FISTULA Left 03/10/2021    Procedure: LIGATION OF LEFT EXTERNAL CAROTID;  Surgeon: Marty Heck, MD;  Location: [place];  Service: Vascular;  Laterality: Left;   lumbar back surgery        x2   LUMBAR DISC SURGERY        x 2   stnt        lower extermity   TENDON REPAIR        to the left arm   TONSILLECTOMY       VEIN HARVEST Left 03/10/2021    Procedure: VEIN HARVEST FROM LEFT SAPHENOUS VEIN;  Surgeon: Marty Heck, MD;  Location: [facility] OR;  Service: Vascular;  Laterality: Left;            Current Outpatient Medications  Medication Sig Dispense Refill   albuterol (VENTOLIN HFA) 108 (90 Base) MCG/ACT inhaler Inhale 1-2 puffs into the lungs every 6 (six) hours as needed for shortness of breath or wheezing.       allopurinol (ZYLOPRIM) 300 MG tablet Take 300 mg by mouth daily.       amLODipine (NORVASC) 10 MG tablet Take 10 mg by mouth daily.       amLODipine (NORVASC) 5 MG tablet Take 1 tablet (5 mg total) by mouth daily. 90 tablet 3   aspirin EC 81 MG tablet Take 1 tablet (81 mg total) by mouth daily. 90 tablet 3    azelastine (ASTELIN) 0.1 % nasal spray Place 1-2 sprays into both nostrils 2 (two) times daily as needed for rhinitis. Use in each nostril as directed       baclofen (LIORESAL) 10 MG tablet Take 0.5 tablets (5 mg total) by mouth 3 (three) times daily. 30 each 0   carvedilol (COREG) 12.5 MG tablet Take 1.5 tablets (18.75 mg  total) by mouth 2 (two) times daily. (Patient taking differently: Take 12.5 mg by mouth 2 (two) times daily.) 180 tablet 3   diphenhydrAMINE (BENADRYL) 25 MG tablet Take 25 mg by mouth every 6 (six) hours as needed for allergies.       ELIQUIS 5 MG TABS tablet TAKE 1 TABLET(5 MG) BY MOUTH TWICE DAILY (Patient taking differently: Take 5 mg by mouth 2 (two) times daily.) 180 tablet 3   furosemide (LASIX) 40 MG tablet Take 40 mg by mouth 2 (two) times daily.       gabapentin (NEURONTIN) 300 MG capsule Take 300 mg by mouth 2 (two) times daily.       ibuprofen (ADVIL) 200 MG tablet Take 200 mg by mouth every 6 (six) hours as needed for mild pain or moderate pain.       losartan-hydrochlorothiazide (HYZAAR) 50-12.5 MG tablet Take 1 tablet by mouth daily.       Magnesium Oxide 400 MG CAPS Take 1 capsule (400 mg total) by mouth in the morning and at bedtime. (Patient taking differently: Take 400 mg by mouth in the morning and at bedtime.) 180 capsule 3   omeprazole (PRILOSEC) 20 MG capsule Take 20 mg by mouth daily with breakfast.       oxyCODONE-acetaminophen (PERCOCET) 7.5-325 MG tablet Take 0.5 tablets by mouth every 4 (four) hours as needed for moderate pain.       potassium chloride (KLOR-CON) 10 MEQ tablet TAKE 1 TABLET(10 MEQ) BY MOUTH DAILY (Patient taking differently: Take 10 mEq by mouth daily.) 90 tablet 0   rosuvastatin (CRESTOR) 10 MG tablet Take 10 mg by mouth daily.       rosuvastatin (CRESTOR) 20 MG tablet Take 1 tablet (20 mg total) by mouth every evening. 30 tablet 0   TRELEGY ELLIPTA 100-62.5-25 MCG/INH AEPB Inhale 1 puff into the lungs at bedtime.        No current  facility-administered medications for this encounter.          Allergies  Allergen Reactions   Plavix [Clopidogrel] Rash      Social History         Socioeconomic History   Marital status: Married      Spouse name: Vicky   Number of children: Not on file   Years of education: Not on file   Highest education level: Not on file  Occupational History   Not on file  Tobacco Use   Smoking status: Every Day      Packs/day: 0.40      Years: 50.00      Pack years: 20.00      Types: Cigarettes   Smokeless tobacco: Never   Tobacco comments:      17 cigarettes daily  Vaping Use   Vaping Use: Never used  Substance and Sexual Activity   Alcohol use: Yes      Alcohol/week: 3.0 standard drinks      Types: 3 Glasses of wine per week   Drug use: No   Sexual activity: Not on file  Other Topics Concern   Not on file  Social History Narrative   Not on file    Social Determinants of Health       Financial Resource Strain: Low Risk   Difficulty of Paying Living Expenses: Not hard at all  Food Insecurity: No Food Insecurity   Worried About [place] in the Last Year: Never true   [place] in the Last  Year: Never true  Transportation Needs: No Transportation Needs   Lack of Transportation (Medical): No   Lack of Transportation (Non-Medical): No  Physical Activity: Not on file  Stress: No Stress Concern Present   Feeling of Stress : Not at all  Social Connections: Not on file  Intimate Partner Violence: Not At Risk   Fear of Current or Ex-Partner: No   Emotionally Abused: No   Physically Abused: No   Sexually Abused: No        ROS- All systems are reviewed and negative except as per the HPI above.   Physical Exam:    Vitals:    06/22/21 1026  BP: 118/62  Pulse: 86  Weight: 100.1 kg  Height: '6\' 1"'$  (1.854 m)      GEN- The patient is a well appearing elderly male, alert and oriented x 3 today.   HEENT-head normocephalic, atraumatic, sclera clear,  conjunctiva pink, hearing intact, trachea midline. Lungs- Clear to ausculation bilaterally, normal work of breathing Heart- irregular rate and rhythm, no murmurs, rubs or gallops GI- soft, NT, ND, + BS Extremities- no clubbing, cyanosis, or edema MS- no significant deformity or atrophy Skin- no rash or lesion Psych- euthymic mood, full affect Neuro- strength and sensation are intact        Wt Readings from Last 3 Encounters:  06/22/21 100.1 kg  06/08/21 104.6 kg  05/19/21 98.8 kg      EKG today demonstrates  Afib Vent. rate 86 BPM PR interval * ms QRS duration 88 ms QT/QTcB 366/437 ms   Echo 03/10/21 demonstrated  1. Left ventricular ejection fraction, by estimation, is 55 to 60%. The  left ventricle has normal function. The left ventricle has no regional  wall motion abnormalities. There is mild left ventricular hypertrophy.  Left ventricular diastolic parameters  are consistent with Grade I diastolic dysfunction (impaired relaxation).   2. Right ventricular systolic function is normal. The right ventricular  size is normal.   3. The mitral valve is grossly normal. Trivial mitral valve  regurgitation.   4. The aortic valve is tricuspid. Aortic valve regurgitation is not  visualized.   5. The inferior vena cava is normal in size with <50% respiratory  variability, suggesting right atrial pressure of 8 mmHg.   Comparison(s): No significant change from prior study. 08/27/2019: LVEF  55-60%.   Epic records are reviewed at length today   CHA2DS2-VASc Score = 7  The patient's score is based upon: CHF History: Yes HTN History: Yes Diabetes History: No Stroke History: Yes Vascular Disease History: Yes Age Score: 2 Gender Score: 0        ASSESSMENT AND PLAN: 1. Persistent Atrial Fibrillation (ICD10:  I48.19) The patient's CHA2DS2-VASc score is 7, indicating a 11.2% annual risk of stroke.   DCCV rescheduled for today due to missed dose of Eliquis. Check  bmet/cbc Continue carvedilol 18.75 mg BID until follow up. Continue Eliquis 5 mg BID   2. Secondary Hypercoagulable State (ICD10:  D68.69) The patient is at significant risk for stroke/thromboembolism based upon his CHA2DS2-VASc Score of 7.  Continue Apixaban (Eliquis).   3. HTN Stable, no changes today.   4. Carotid artery disease S/p redo LCEA and L CCA bypass 03/2021.     Follow up in the AF clinic post DCCV.      [facility] [place], [place] [address] [phone] 06/22/2021 10:36 AM        For DCCV; compliant  with apixaban; no changes [name]

## 2021-06-22 NOTE — Anesthesia Procedure Notes (Signed)
Procedure Name: General with mask airway Date/Time: 06/22/2021 12:10 PM Performed by: Fulton Reek, CRNA Pre-anesthesia Checklist: Emergency Drugs available, Patient identified, Suction available, Patient being monitored and Timeout performed Patient Re-evaluated:Patient Re-evaluated prior to induction Oxygen Delivery Method: Ambu bag Preoxygenation: Pre-oxygenation with 100% oxygen Induction Type: IV induction Dental Injury: Teeth and Oropharynx as per pre-operative assessment

## 2021-06-22 NOTE — Discharge Instructions (Signed)

## 2021-06-22 NOTE — Anesthesia Postprocedure Evaluation (Signed)
Anesthesia Post Note  Patient: [name]  Procedure(s) Performed: CARDIOVERSION     Patient location during evaluation: Endoscopy Anesthesia Type: General Level of consciousness: awake and alert Pain management: pain level controlled Vital Signs Assessment: post-procedure vital signs reviewed and stable Respiratory status: spontaneous breathing, nonlabored ventilation, respiratory function stable and patient connected to nasal cannula oxygen Cardiovascular status: blood pressure returned to baseline and stable Postop Assessment: no apparent nausea or vomiting Anesthetic complications: no   No notable events documented.  Last Vitals:  Vitals:   06/22/21 1228 06/22/21 1238  BP: (!) 111/47 (!) 114/45  Pulse: (!) 53 (!) 56  Resp: (!) 9 11  Temp: (!) 36.2 C   SpO2: 96% 92%    Last Pain:  Vitals:   06/22/21 1238  TempSrc:   PainSc: 0-No pain                 Khalise Billard S

## 2021-06-22 NOTE — Procedures (Signed)
Electrical Cardioversion Procedure Note [name] [id number] Jun 15, 1946  Procedure: Electrical Cardioversion Indications:  Atrial Fibrillation  Procedure Details Consent: Risks of procedure as well as the alternatives and risks of each were explained to the (patient/caregiver).  Consent for procedure obtained. Time Out: Verified patient identification, verified procedure, site/side was marked, verified correct patient position, special equipment/implants available, medications/allergies/relevent history reviewed, required imaging and test results available.  Performed  Patient placed on cardiac monitor, pulse oximetry, supplemental oxygen as necessary.  Sedation given:  Pt sedated by anesthesia with lidocaine 80 mg and diprovan 50 mg IV. Pacer pads placed anterior and posterior chest.  Cardioverted 1 time(s).  Cardioverted at [place].  Evaluation Findings: Post procedure EKG shows: Sinus bradycardia Complications: None Patient did tolerate procedure well.   [name] 06/22/2021, 10:54 AM

## 2021-06-22 NOTE — Transfer of Care (Signed)
Immediate Anesthesia Transfer of Care Note  Patient: [name]  Procedure(s) Performed: CARDIOVERSION  Patient Location: Endoscopy Unit  Anesthesia Type:General  Level of Consciousness: awake and alert   Airway & Oxygen Therapy: Patient Spontanous Breathing  Post-op Assessment: Report given to RN and Post -op Vital signs reviewed and stable  Post vital signs: Reviewed and stable  Last Vitals:  Vitals Value Taken Time  BP    Temp    Pulse 50 06/22/21 1218  Resp 8 06/22/21 1218  SpO2 96 % 06/22/21 1218    Last Pain:  Vitals:   06/22/21 1119  TempSrc:   PainSc: 0-No pain         Complications: No notable events documented.

## 2021-06-30 ENCOUNTER — Other Ambulatory Visit: Payer: Self-pay

## 2021-06-30 ENCOUNTER — Encounter (HOSPITAL_COMMUNITY): Payer: Self-pay | Admitting: Physician Assistant

## 2021-06-30 ENCOUNTER — Ambulatory Visit (HOSPITAL_COMMUNITY)
Admission: RE | Admit: 2021-06-30 | Discharge: 2021-06-30 | Disposition: A | Discharge status: Home / Self Care | Payer: Medicare Other | Source: Ambulatory Visit | Attending: Physician Assistant | Admitting: Physician Assistant

## 2021-06-30 VITALS — BP 126/62 | HR 46 | Ht 73.0 in | Wt 229.0 lb

## 2021-06-30 DIAGNOSIS — I10 Essential (primary) hypertension: Secondary | ICD-10-CM | POA: Insufficient documentation

## 2021-06-30 DIAGNOSIS — Z951 Presence of aortocoronary bypass graft: Secondary | ICD-10-CM | POA: Insufficient documentation

## 2021-06-30 DIAGNOSIS — Z79899 Other long term (current) drug therapy: Secondary | ICD-10-CM | POA: Insufficient documentation

## 2021-06-30 DIAGNOSIS — D6869 Other thrombophilia: Secondary | ICD-10-CM | POA: Insufficient documentation

## 2021-06-30 DIAGNOSIS — Z7901 Long term (current) use of anticoagulants: Secondary | ICD-10-CM | POA: Insufficient documentation

## 2021-06-30 DIAGNOSIS — R001 Bradycardia, unspecified: Secondary | ICD-10-CM | POA: Insufficient documentation

## 2021-06-30 DIAGNOSIS — I4819 Other persistent atrial fibrillation: Secondary | ICD-10-CM | POA: Insufficient documentation

## 2021-06-30 DIAGNOSIS — F17210 Nicotine dependence, cigarettes, uncomplicated: Secondary | ICD-10-CM | POA: Diagnosis not present

## 2021-06-30 MED ORDER — CARVEDILOL 12.5 MG PO TABS: 12.5000 mg | ORAL_TABLET | Freq: Two times a day (BID) | ORAL | Status: DC

## 2021-06-30 NOTE — Progress Notes (Signed)
Primary Care Physician: Raelene Bott, MD Primary Cardiologist: Dr Burt Knack Primary Electrophysiologist: none Referring Physician: Andres Labrum Wichman is a 75 y.o. male with a history of chronic diastolic CHF, HTN, CKD, bilateral carotid stenosis, atrial fibrillation who presents for follow up in the [facility]. Patient is on Eliquis for a CHADS2VASC score of 4. Patient seen by [name] 05/04/21 and found to be back in afib. He does have symptoms of fatigue and SOB. He had redo LCEA and L CCA bypass on 4/6/222 and had a seroma on his thigh which is healing, no interventions planned. Patient was scheduled for DCCV on 06/02/21 but he had missed a dose of Eliquis and this was rescheduled.   On follow up today, patient is s/p DCCV on 06/22/21. Patient reports that he feels he is slowly getting stronger. He does not have as much dyspnea with exertion. He denies any bleeding issues on anticoagulation.   Today, he denies symptoms of palpitations, chest pain, orthopnea, PND, lower extremity edema, dizziness, presyncope, syncope, snoring, daytime somnolence, bleeding, or neurologic sequela. The patient is tolerating medications without difficulties and is otherwise without complaint today.    Atrial Fibrillation Risk Factors:  he does not have symptoms or diagnosis of sleep apnea. he does not have a history of rheumatic fever.   he has a BMI of Body mass index is 30.21 kg/m.[name] Filed Weights   06/30/21 1025  Weight: 103.9 kg      Family History  Problem Relation Age of Onset   Hypertension Mother    Hyperlipidemia Mother      Atrial Fibrillation Management history:  Previous antiarrhythmic drugs: none Previous cardioversions: 11/2019, 06/22/21 Previous ablations: none CHADS2VASC score: 7 Anticoagulation history: Eliquis   Past Medical History:  Diagnosis Date   Arthritis    CKD (chronic kidney disease), stage III (HCC)    COPD (chronic  obstructive pulmonary disease) (HCC)    Dyspnea on exertion    Edema    HTN (hypertension)    Hyperlipidemia    Left carotid stenosis    a. 2007 s/p L CEA;  b. 2011 s/p L common carotid stenting 2/2 restenosis;  c. [id number] u/s: RICA [id number], LICA [id number], patent LCCA stent.   PAD (peripheral artery disease) (HCC)    a. s/p prior RSFA, REIA, distal LSFA (known to be occluded), LEIA, and LCIA stenting;  b. 01/2015 ABI's: R 0.78, L 0.53.   Stroke [facility]) 03/2021   no residual    Past Surgical History:  Procedure Laterality Date   ABDOMINAL AORTAGRAM N/A 02/05/2015   Procedure: ABDOMINAL AORTAGRAM;  Surgeon: Conrad Edmonds, MD;  Location: [facility] CATH LAB;  Service: Cardiovascular;  Laterality: N/A;   AORTIC ARCH ANGIOGRAPHY N/A 07/19/2019   Procedure: AORTIC ARCH ANGIOGRAPHY;  Surgeon: Elam Dutch, MD;  Location: [place] CV LAB;  Service: Cardiovascular;  Laterality: N/A;   CARDIOVERSION N/A 11/06/2019   Procedure: CARDIOVERSION;  Surgeon: Pixie Casino, MD;  Location: [facility] ENDOSCOPY;  Service: Cardiovascular;  Laterality: N/A;   CARDIOVERSION N/A 06/22/2021   Procedure: CARDIOVERSION;  Surgeon: Lelon Perla, MD;  Location: [place];  Service: Cardiovascular;  Laterality: N/A;   CAROTID ANGIOGRAPHY N/A 07/19/2019   Procedure: CAROTID ANGIOGRAPHY;  Surgeon: Elam Dutch, MD;  Location: [place] CV LAB;  Service: Cardiovascular;  Laterality: N/A;   carotid endarterecotomy     CAROTID PTA/STENT INTERVENTION Left 08/02/2019   Procedure: CAROTID PTA/STENT INTERVENTION;  Surgeon: Oneida Alar,  Jessy Oto, MD;  Location: [place] CV LAB;  Service: Cardiovascular;  Laterality: Left;   ENDARTERECTOMY Left 03/10/2021   Procedure: REDO LEFT CAROTID ENDARTERECTOMY WITH LEFT COMMON TO INTERNAL CAROTID ARTERY BYPASS;  Surgeon: Marty Heck, MD;  Location: [place];  Service: Vascular;  Laterality: Left;   external iliac     status post bilateral and left common iliac artery   INCISION AND DRAINAGE  Left 03/31/2021   Procedure: INCISION AND DRAINAGE OF LEFT THIGH WITH DRAIN PLACEMENT;  Surgeon: Marty Heck, MD;  Location: [place];  Service: Vascular;  Laterality: Left;   LIGATION OF ARTERIOVENOUS  FISTULA Left 03/10/2021   Procedure: LIGATION OF LEFT EXTERNAL CAROTID;  Surgeon: Marty Heck, MD;  Location: [place];  Service: Vascular;  Laterality: Left;   lumbar back surgery     x2   LUMBAR DISC SURGERY     x 2   stnt     lower extermity   TENDON REPAIR     to the left arm   TONSILLECTOMY     VEIN HARVEST Left 03/10/2021   Procedure: VEIN HARVEST FROM LEFT SAPHENOUS VEIN;  Surgeon: Marty Heck, MD;  Location: [facility] OR;  Service: Vascular;  Laterality: Left;    Current Outpatient Medications  Medication Sig Dispense Refill   albuterol (VENTOLIN HFA) 108 (90 Base) MCG/ACT inhaler Inhale 1-2 puffs into the lungs every 6 (six) hours as needed for shortness of breath or wheezing.     allopurinol (ZYLOPRIM) 300 MG tablet Take 300 mg by mouth daily.     amLODipine (NORVASC) 10 MG tablet Take 10 mg by mouth daily.     amLODipine (NORVASC) 5 MG tablet Take 1 tablet (5 mg total) by mouth daily. 90 tablet 3   aspirin EC 81 MG tablet Take 1 tablet (81 mg total) by mouth daily. 90 tablet 3   azelastine (ASTELIN) 0.1 % nasal spray Place 1-2 sprays into both nostrils 2 (two) times daily as needed for rhinitis. Use in each nostril as directed     baclofen (LIORESAL) 10 MG tablet Take 0.5 tablets (5 mg total) by mouth 3 (three) times daily. 30 each 0   carvedilol (COREG) 12.5 MG tablet Take 1.5 tablets (18.75 mg total) by mouth 2 (two) times daily. 180 tablet 3   diphenhydrAMINE (BENADRYL) 25 MG tablet Take 25 mg by mouth every 6 (six) hours as needed for allergies.     ELIQUIS 5 MG TABS tablet TAKE 1 TABLET(5 MG) BY MOUTH TWICE DAILY 180 tablet 3   furosemide (LASIX) 40 MG tablet Take 40 mg by mouth 2 (two) times daily.     gabapentin (NEURONTIN) 300 MG capsule Take 300 mg by mouth 2  (two) times daily.     ibuprofen (ADVIL) 200 MG tablet Take 200 mg by mouth every 6 (six) hours as needed for mild pain or moderate pain.     losartan-hydrochlorothiazide (HYZAAR) 50-12.5 MG tablet Take 1 tablet by mouth daily.     Magnesium Oxide 400 MG CAPS Take 1 capsule (400 mg total) by mouth in the morning and at bedtime. 180 capsule 3   omeprazole (PRILOSEC) 20 MG capsule Take 20 mg by mouth daily with breakfast.      oxyCODONE-acetaminophen (PERCOCET) 7.5-325 MG tablet Take 0.5 tablets by mouth every 4 (four) hours as needed for moderate pain.     potassium chloride (KLOR-CON) 10 MEQ tablet TAKE 1 TABLET(10 MEQ) BY MOUTH DAILY 90 tablet 0  rosuvastatin (CRESTOR) 10 MG tablet Take 10 mg by mouth daily.     TRELEGY ELLIPTA 100-62.5-25 MCG/INH AEPB Inhale 1 puff into the lungs at bedtime.      No current facility-administered medications for this encounter.    Allergies  Allergen Reactions   Plavix [Clopidogrel] Rash    Social History   Socioeconomic History   Marital status: Married    Spouse name: Vicky   Number of children: Not on file   Years of education: Not on file   Highest education level: Not on file  Occupational History   Not on file  Tobacco Use   Smoking status: Every Day    Packs/day: 1.00    Years: 50.00    Pack years: 50.00    Types: Cigarettes   Smokeless tobacco: Never   Tobacco comments:    Pack daily  Vaping Use   Vaping Use: Never used  Substance and Sexual Activity   Alcohol use: Yes    Alcohol/week: 3.0 standard drinks    Types: 3 Glasses of wine per week   Drug use: No   Sexual activity: Not on file  Other Topics Concern   Not on file  Social History Narrative   Not on file   Social Determinants of Health   Financial Resource Strain: Low Risk    Difficulty of Paying Living Expenses: Not hard at all  Food Insecurity: No Food Insecurity   Worried About Charity fundraiser in the Last Year: Never true   Ran Out of Food in the Last  Year: Never true  Transportation Needs: No Transportation Needs   Lack of Transportation (Medical): No   Lack of Transportation (Non-Medical): No  Physical Activity: Not on file  Stress: No Stress Concern Present   Feeling of Stress : Not at all  Social Connections: Not on file  Intimate Partner Violence: Not At Risk   Fear of Current or Ex-Partner: No   Emotionally Abused: No   Physically Abused: No   Sexually Abused: No     ROS- All systems are reviewed and negative except as per the HPI above.  Physical Exam: Vitals:   06/30/21 1025  BP: 126/62  Pulse: (!) 46  Weight: 103.9 kg  Height: '6\' 1"'$  (1.854 m)    GEN- The patient is a well appearing obese elderly male, alert and oriented x 3 today.   HEENT-head normocephalic, atraumatic, sclera clear, conjunctiva pink, hearing intact, trachea midline. Lungs- Clear to ausculation bilaterally, normal work of breathing Heart- Regular rate and rhythm, bradycardia, no murmurs, rubs or gallops  GI- soft, NT, ND, + BS Extremities- no clubbing, cyanosis, or edema MS- no significant deformity or atrophy Skin- no rash or lesion Psych- euthymic mood, full affect Neuro- strength and sensation are intact   Wt Readings from Last 3 Encounters:  06/30/21 103.9 kg  06/22/21 100.1 kg  06/08/21 104.6 kg    EKG today demonstrates  SB Vent. rate 46 BPM PR interval 192 ms QRS duration 90 ms QT/QTcB 460/402 ms  Echo 03/10/21 demonstrated   1. Left ventricular ejection fraction, by estimation, is 55 to 60%. The  left ventricle has normal function. The left ventricle has no regional  wall motion abnormalities. There is mild left ventricular hypertrophy.  Left ventricular diastolic parameters  are consistent with Grade I diastolic dysfunction (impaired relaxation).   2. Right ventricular systolic function is normal. The right ventricular  size is normal.   3. The mitral valve is grossly  normal. Trivial mitral valve  regurgitation.   4.  The aortic valve is tricuspid. Aortic valve regurgitation is not  visualized.   5. The inferior vena cava is normal in size with <50% respiratory  variability, suggesting right atrial pressure of 8 mmHg.   Comparison(s): No significant change from prior study. 08/27/2019: LVEF  55-60%.   Epic records are reviewed at length today  CHA2DS2-VASc Score = 7  The patient's score is based upon: CHF History: Yes HTN History: Yes Diabetes History: No Stroke History: Yes Vascular Disease History: Yes Age Score: 2 Gender Score: 0      ASSESSMENT AND PLAN: 1. Persistent Atrial Fibrillation (ICD10:  I48.19) The patient's CHA2DS2-VASc score is 7, indicating a 11.2% annual risk of stroke.   S/p DCCV 06/22/21 Patient back in sinus bradycardia. Will decrease carvedilol back to 12.5 mg BID. Continue Eliquis 5 mg BID  2. Secondary Hypercoagulable State (ICD10:  D68.69) The patient is at significant risk for stroke/thromboembolism based upon his CHA2DS2-VASc Score of 7.  Continue Apixaban (Eliquis).   3. HTN Stable, med changes as above.  4. Carotid artery disease  S/p redo LCEA and L CCA bypass 03/2021.   Follow up with Dr Antionette Char office in 3 months. AF clinic in 6 months.   [facility] [address] [place], [place] [address] [phone] 06/30/2021 10:39 AM

## 2021-06-30 NOTE — Patient Instructions (Signed)
Decrease Carvedilol to 12.'5mg'$ - take one tablet by mouth twice daily

## 2021-07-29 ENCOUNTER — Other Ambulatory Visit: Payer: Self-pay | Admitting: Cardiovascular Disease

## 2021-07-29 NOTE — Telephone Encounter (Signed)
Prescription refill request for Eliquis received. Indication: Afib Last office visit: 05/04/21 Kathlen Mody) Scr: 3.14 (06/22/21) Age: 75 Weight: 103.9kg  Appropriate dose and refill sent to requested pharmacy.

## 2021-09-10 ENCOUNTER — Telehealth: Payer: Self-pay | Admitting: *Deleted

## 2021-09-10 NOTE — Telephone Encounter (Signed)
Pt has appt 10/28 with Laurann Montana, NP. Will forward notes to NP for upcoming appt. Will send FYI to requesting office pt has appt.

## 2021-09-10 NOTE — Telephone Encounter (Signed)
   [facility] Pre-operative Risk Assessment    Patient Name: [name]  DOB: [birth year] MRN: [id number]  HEARTCARE STAFF:  - IMPORTANT!!!!!! Under Visit Info/Reason for Call, type in Other and utilize the format Clearance MM/DD/YY or Clearance TBD. Do not use dashes or single digits. - Please review there is not already an duplicate clearance open for this procedure. - If request is for dental extraction, please clarify the # of teeth to be extracted. - If the patient is currently at the dentist's office, call Pre-Op Callback Staff (MA/nurse) to input urgent request.  - If the patient is not currently in the dentist office, please route to the Pre-Op pool.  Request for surgical clearance:  What type of surgery is being performed? ORAL SURGERY FOR SOFT TISSUE FLAPS AND BONE REMOVAL   When is this surgery scheduled? TBD  What type of clearance is required (medical clearance vs. Pharmacy clearance to hold med vs. Both)? BOTH  Are there any medications that need to be held prior to surgery and how long? ELIQUIS AND ASA; REQUESTING HOLD TIME BETWEEN 2-4 DAYS PRIOR  Practice name and name of physician performing surgery? [facility]; DR. Alfonso Ramus, DDS  What is the office phone number? [phone]   7.   What is the office fax number? [phone]  8.   Anesthesia type (None, local, MAC, general) ? DEEP IV SEDATION; VERSED, FENTANYL, PROPOFOL, DECADRON     Julaine Hua 09/10/2021, 10:10 AM  _________________________________________________________________   (provider comments below) SOFT TI

## 2021-09-10 NOTE — Telephone Encounter (Signed)
Patient has upcoming visit near the end of this month on 10/28 with CaitlIn Walker, cardiac clearance can be given at the time of visit

## 2021-09-10 NOTE — Telephone Encounter (Signed)
Patient with diagnosis of afib on Eliquis for anticoagulation.    Procedure: ORAL SURGERY FOR SOFT TISSUE FLAPS AND BONE REMOVAL  Date of procedure: TBD  CHA2DS2-VASc Score = 7  This indicates a 11.2% annual risk of stroke. The patient's score is based upon: CHF History: 1 HTN History: 1 Diabetes History: 0 Stroke History: 2 Vascular Disease History: 1 Age Score: 2 Gender Score: 0   CrCl 69m/min Platelet count 174K  Patient does not require pre-op antibiotics for dental procedure.  Prefer shorter hold time given pt's elevated cardiovascular risk - recommend holding Eliquis for only 1 day (1.5 days max) prior to procedure and resume as soon as safely possible after.

## 2021-09-10 NOTE — Telephone Encounter (Signed)
Clinical pharmacist to review Eliquis 

## 2021-10-01 ENCOUNTER — Encounter (HOSPITAL_BASED_OUTPATIENT_CLINIC_OR_DEPARTMENT_OTHER): Payer: Self-pay | Admitting: Family

## 2021-10-01 ENCOUNTER — Ambulatory Visit (INDEPENDENT_AMBULATORY_CARE_PROVIDER_SITE_OTHER): Payer: Medicare Other | Admitting: Family

## 2021-10-01 ENCOUNTER — Other Ambulatory Visit: Payer: Self-pay

## 2021-10-01 VITALS — BP 130/56 | HR 47 | Ht 73.0 in | Wt 215.1 lb

## 2021-10-01 DIAGNOSIS — D6869 Other thrombophilia: Secondary | ICD-10-CM

## 2021-10-01 DIAGNOSIS — Z01818 Encounter for other preprocedural examination: Secondary | ICD-10-CM | POA: Diagnosis not present

## 2021-10-01 DIAGNOSIS — I4819 Other persistent atrial fibrillation: Secondary | ICD-10-CM | POA: Diagnosis not present

## 2021-10-01 DIAGNOSIS — I10 Essential (primary) hypertension: Secondary | ICD-10-CM

## 2021-10-01 DIAGNOSIS — I5032 Chronic diastolic (congestive) heart failure: Secondary | ICD-10-CM | POA: Diagnosis not present

## 2021-10-01 MED ORDER — CARVEDILOL 6.25 MG PO TABS: 6.2500 mg | ORAL_TABLET | Freq: Two times a day (BID) | ORAL | 3 refills | Status: DC

## 2021-10-01 NOTE — Patient Instructions (Addendum)
Medication Instructions:  Your physician has recommended you make the following change in your medication:   CHANGE your Carvedilol to 6.25mg  twice daily  You can take a half tablet of your 12.5mg  tablet twice daily until you run out if you wish.   *If you need a refill on your cardiac medications before your next appointment, please call your pharmacy*  Lab Work: None ordered today.   Testing/Procedures: Your EKG today showed sinus bradycardia which is a stable finding.   Follow-Up: At [facility], you and your health needs are our priority.  As part of our continuing mission to provide you with exceptional heart care, we have created designated Provider Care Teams.  These Care Teams include your primary Cardiologist (physician) and Advanced Practice Providers (APPs -  Physician Assistants and Nurse Practitioners) who all work together to provide you with the care you need, when you need it.  We recommend signing up for the patient portal called "MyChart".  Sign up information is provided on this After Visit Summary.  MyChart is used to connect with patients for Virtual Visits (Telemedicine).  Patients are able to view lab/test results, encounter notes, upcoming appointments, etc.  Non-urgent messages can be sent to your provider as well.   To learn more about what you can do with MyChart, go to [email or web address].    Your next appointment:   6 month(s)  The format for your next appointment:   In Person  Provider:   You may see Sherren Mocha, MD or one of the following Advanced Practice Providers on your designated Care Team:   [name], PA-C Vin Howard, [place]  Other Instructions   To prevent lightheadedness: Stay well hydrated.  Reduce caffeine intake. Eat regular meals. Make position changes slowly.  Heart Healthy Diet Recommendations: A low-salt diet is recommended. Meats should be grilled, baked, or boiled. Avoid fried foods. Focus on lean protein sources  like fish or chicken with vegetables and fruits. The American Heart Association is a Microbiologist!  American Heart Association Diet and Lifeystyle Recommendations    Exercise recommendations: The American Heart Association recommends 150 minutes of moderate intensity exercise weekly. Try 30 minutes of moderate intensity exercise 4-5 times per week. This could include walking, jogging, or swimming.

## 2021-10-01 NOTE — Progress Notes (Signed)
Office Visit    Patient Name: [name] Date of Encounter: 10/01/2021  PCP:  Raelene Bott, [facility]  Cardiologist:  Sherren Mocha, MD  Advanced Practice Provider:  Liliane Shi, PA-C Electrophysiologist:  None      Chief Complaint    [name] is a 75 y.o. male with a hx of chronic diastolic heart failure, HTN, CKD, bilateral carotid artery stenosis, atrial fibrillation, CVA presents today for preop clearance   Past Medical History    Past Medical History:  Diagnosis Date   Arthritis    CKD (chronic kidney disease), stage III ([place])    COPD (chronic obstructive pulmonary disease) ([place])    Dyspnea on exertion    Edema    HTN (hypertension)    Hyperlipidemia    Left carotid stenosis    a. 2007 s/p L CEA;  b. 2011 s/p L common carotid stenting 2/2 restenosis;  c. 06/622 u/s: RICA <76, LICA 28-31%, patent LCCA stent.   PAD (peripheral artery disease) (HCC)    a. s/p prior RSFA, REIA, distal LSFA (known to be occluded), LEIA, and LCIA stenting;  b. 01/2015 ABI's: R 0.78, L 0.53.   Stroke [facility]) 03/2021   no residual    Past Surgical History:  Procedure Laterality Date   ABDOMINAL AORTAGRAM N/A 02/05/2015   Procedure: ABDOMINAL AORTAGRAM;  Surgeon: Conrad Val Verde, MD;  Location: [facility] CATH LAB;  Service: Cardiovascular;  Laterality: N/A;   AORTIC ARCH ANGIOGRAPHY N/A 07/19/2019   Procedure: AORTIC ARCH ANGIOGRAPHY;  Surgeon: Elam Dutch, MD;  Location: [place] CV LAB;  Service: Cardiovascular;  Laterality: N/A;   CARDIOVERSION N/A 11/06/2019   Procedure: CARDIOVERSION;  Surgeon: Pixie Casino, MD;  Location: [facility] ENDOSCOPY;  Service: Cardiovascular;  Laterality: N/A;   CARDIOVERSION N/A 06/22/2021   Procedure: CARDIOVERSION;  Surgeon: Lelon Perla, MD;  Location: [place];  Service: Cardiovascular;  Laterality: N/A;   CAROTID ANGIOGRAPHY N/A 07/19/2019   Procedure: CAROTID ANGIOGRAPHY;  Surgeon: Elam Dutch, MD;   Location: [place] CV LAB;  Service: Cardiovascular;  Laterality: N/A;   carotid endarterecotomy     CAROTID PTA/STENT INTERVENTION Left 08/02/2019   Procedure: CAROTID PTA/STENT INTERVENTION;  Surgeon: Elam Dutch, MD;  Location: [place] CV LAB;  Service: Cardiovascular;  Laterality: Left;   ENDARTERECTOMY Left 03/10/2021   Procedure: REDO LEFT CAROTID ENDARTERECTOMY WITH LEFT COMMON TO INTERNAL CAROTID ARTERY BYPASS;  Surgeon: Marty Heck, MD;  Location: [place];  Service: Vascular;  Laterality: Left;   external iliac     status post bilateral and left common iliac artery   INCISION AND DRAINAGE Left 03/31/2021   Procedure: INCISION AND DRAINAGE OF LEFT THIGH WITH DRAIN PLACEMENT;  Surgeon: Marty Heck, MD;  Location: [place];  Service: Vascular;  Laterality: Left;   LIGATION OF ARTERIOVENOUS  FISTULA Left 03/10/2021   Procedure: LIGATION OF LEFT EXTERNAL CAROTID;  Surgeon: Marty Heck, MD;  Location: [place];  Service: Vascular;  Laterality: Left;   lumbar back surgery     x2   LUMBAR DISC SURGERY     x 2   stnt     lower extermity   TENDON REPAIR     to the left arm   TONSILLECTOMY     VEIN HARVEST Left 03/10/2021   Procedure: VEIN HARVEST FROM LEFT SAPHENOUS VEIN;  Surgeon: Marty Heck, MD;  Location: [place];  Service: Vascular;  Laterality: Left;    Allergies  Allergies  Allergen Reactions   Plavix [Clopidogrel] Rash    History of Present Illness    [name] is a 75 y.o. male with a hx of chronic diastolic heart failure, HTN, CKD, bilateral carotid artery disease, atrial fibrillation, CVA.  last seen by atrial fibrillation clinic 06/30/21.  Prior L CEA 2007, L CCA stenting in 2011 due to restenosis, L ICA stenting 07/2019 due to restenosis. He has persistent atrial fibrillation with cardioversion 11/2019. He was admitted 03/2021 for redo left CEA and L CCA to L ICA bypass. Given recurrent atrial fibrillation, required cardioversion  06/22/21. He was seen by atrial fibrillation clinic 06/30/21 doing well and maintaining NSR.   Presents today for preop clearance for oral surgery for soft tissue flaps and bone removal. However, shares with me that he already had his surgery. Notes that he held his Eliquis 3 days prior and 2 days post procedure at direction of his dentist. Per our pharmacy team he was recommended to hold Eliquis only 1 day (1.5 days max) prior ot procedure and resume as soon as safely possible due to elevated CHADS2VASc score of 7. He endorses occasional episodes of lightheadedness. Wife notes that he often skips meals and only drinks coffee and caffeinated tea. Not checking BP nor heart rate at home. Reports no shortness of breath nor dyspnea on exertion. Reports no chest pain, pressure, or tightness. No edema, orthopnea, PND. Reports no palpitations.     EKGs/Labs/Other Studies Reviewed:   The following studies were reviewed today:   EKG:  EKG is ordered today.  The ekg ordered today demonstrates SB 47 bpm with TWI to inferior leads which has been present on prior EKG.  Recent Labs: 03/09/2021: ALT 16 05/13/2021: Magnesium 1.7 06/22/2021: BUN 42; Creatinine, Ser 3.14; Hemoglobin 11.7; Platelets 174; Potassium 4.3; Sodium 137  Recent Lipid Panel    Component Value Date/Time   CHOL 145 03/10/2021 0254   TRIG 126 03/10/2021 0254   HDL 33 (L) 03/10/2021 0254   CHOLHDL 4.4 03/10/2021 0254   VLDL 25 03/10/2021 0254   LDLCALC 87 03/10/2021 0254   LDLDIRECT 98.5 03/13/2012 0836    Risk Assessment/Calculations:   CHA2DS2-VASc Score = 7   This indicates a 11.2% annual risk of stroke. The patient's score is based upon: CHF History: 1 HTN History: 1 Diabetes History: 0 Stroke History: 2 Vascular Disease History: 1 Age Score: 2 Gender Score: 0     Home Medications   Current Meds  Medication Sig   albuterol (VENTOLIN HFA) 108 (90 Base) MCG/ACT inhaler Inhale 1-2 puffs into the lungs every 6 (six) hours  as needed for shortness of breath or wheezing.   allopurinol (ZYLOPRIM) 300 MG tablet Take 300 mg by mouth daily.   amLODipine (NORVASC) 10 MG tablet Take 10 mg by mouth daily.   amLODipine (NORVASC) 5 MG tablet Take 1 tablet (5 mg total) by mouth daily.   aspirin EC 81 MG tablet Take 1 tablet (81 mg total) by mouth daily.   azelastine (ASTELIN) 0.1 % nasal spray Place 1-2 sprays into both nostrils 2 (two) times daily as needed for rhinitis. Use in each nostril as directed   baclofen (LIORESAL) 10 MG tablet Take 0.5 tablets (5 mg total) by mouth 3 (three) times daily.   carvedilol (COREG) 12.5 MG tablet Take 1 tablet (12.5 mg total) by mouth 2 (two) times daily.   diphenhydrAMINE (BENADRYL) 25 MG tablet Take 25 mg by mouth  every 6 (six) hours as needed for allergies.   ELIQUIS 5 MG TABS tablet TAKE 1 TABLET(5 MG) BY MOUTH TWICE DAILY   furosemide (LASIX) 40 MG tablet Take 40 mg by mouth 2 (two) times daily.   gabapentin (NEURONTIN) 300 MG capsule Take 300 mg by mouth 2 (two) times daily.   ibuprofen (ADVIL) 200 MG tablet Take 200 mg by mouth every 6 (six) hours as needed for mild pain or moderate pain.   losartan-hydrochlorothiazide (HYZAAR) 50-12.5 MG tablet Take 1 tablet by mouth daily.   Magnesium Oxide 400 MG CAPS Take 1 capsule (400 mg total) by mouth in the morning and at bedtime.   omeprazole (PRILOSEC) 20 MG capsule Take 20 mg by mouth daily with breakfast.    oxyCODONE-acetaminophen (PERCOCET) 7.5-325 MG tablet Take 0.5 tablets by mouth every 4 (four) hours as needed for moderate pain.   potassium chloride (KLOR-CON) 10 MEQ tablet TAKE 1 TABLET(10 MEQ) BY MOUTH DAILY   rosuvastatin (CRESTOR) 10 MG tablet Take 10 mg by mouth daily.   TRELEGY ELLIPTA 100-62.5-25 MCG/INH AEPB Inhale 1 puff into the lungs at bedtime.      Review of Systems      All other systems reviewed and are otherwise negative except as noted above.  Physical Exam    VS:  BP (!) 130/56   Pulse (!) 47   Ht 6\' 1"   (1.854 m)   Wt 215 lb 1.6 oz (97.6 kg)   SpO2 97%   BMI 28.38 kg/m  , BMI Body mass index is 28.38 kg/m.  Wt Readings from Last 3 Encounters:  10/01/21 215 lb 1.6 oz (97.6 kg)  06/30/21 229 lb (103.9 kg)  06/22/21 220 lb 9.6 oz (100.1 kg)     GEN: Well nourished, well developed, in no acute distress. HEENT: normal. Neck: Supple, no JVD, carotid bruits, or masses. Cardiac: RRR, bradycardic,  no murmurs, rubs, or gallops. No clubbing, cyanosis, edema.  Radials/PT 2+ and equal bilaterally.  Respiratory:  Respirations regular and unlabored, clear to auscultation bilaterally. GI: Soft, nontender, nondistended. MS: No deformity or atrophy. Skin: Warm and dry, no rash. Neuro:  Strength and sensation are intact. Psych: Normal affect.  Assessment & Plan    PAF / Chronic anticoagulation - Maintaining SB by EKG today. Given bradycardia and reports of lightheadedness, reduce Coreg to 6.25mg  BID. Continue Eliquis 5mg  BID. Denies bleeding complications. CHA2DS2-VASc Score = 7 [CHF History: 1, HTN History: 1, Diabetes History: 0, Stroke History: 2, Vascular Disease History: 1, Age Score: 2, Gender Score: 0].  Therefore, the patient's annual risk of stroke is 11.2 %.      Diastolic heart failure - Euvolemic and well compensated on exam. Continue low salt diet and fluid restriction < 2L. Continue Lasix 40mg  QD.   Lightheadedness - Possible etiology hypoglycemia, dehydration, bradycardia. Will reduce Coreg, as above. Recommend making slow position changes, reducing caffeine intake, staying well hydrated, eating regular meals. No near syncope nor syncope.   Carotid artery disease - follows with vascular surgery. Continue aspirin, statin.   HLD - Continue Rosuvastatin 10mg  QD.   HTN - BP well controlled. Continue current antihypertensive regimen.    Disposition: Follow up in 6 month(s) with Dr. Burt Knack or APP.  Signed, Loel Dubonnet, NP 10/01/2021, 11:17 AM [place]

## 2021-10-18 ENCOUNTER — Ambulatory Visit: Payer: Medicare Other | Admitting: Adult Health

## 2021-10-27 ENCOUNTER — Other Ambulatory Visit: Payer: Self-pay | Admitting: Cardiovascular Disease

## 2022-01-05 ENCOUNTER — Other Ambulatory Visit: Payer: Self-pay

## 2022-01-05 ENCOUNTER — Encounter (HOSPITAL_COMMUNITY): Payer: Self-pay | Admitting: Physician Assistant

## 2022-01-05 ENCOUNTER — Ambulatory Visit (HOSPITAL_COMMUNITY)
Admission: RE | Admit: 2022-01-05 | Discharge: 2022-01-05 | Disposition: A | Discharge status: Home / Self Care | Payer: Medicare Other | Source: Ambulatory Visit | Attending: Physician Assistant | Admitting: Physician Assistant

## 2022-01-05 VITALS — BP 146/60 | HR 62 | Ht 73.0 in | Wt 216.0 lb

## 2022-01-05 DIAGNOSIS — I491 Atrial premature depolarization: Secondary | ICD-10-CM | POA: Insufficient documentation

## 2022-01-05 DIAGNOSIS — Z8673 Personal history of transient ischemic attack (TIA), and cerebral infarction without residual deficits: Secondary | ICD-10-CM | POA: Insufficient documentation

## 2022-01-05 DIAGNOSIS — N183 Chronic kidney disease, stage 3 unspecified: Secondary | ICD-10-CM | POA: Insufficient documentation

## 2022-01-05 DIAGNOSIS — Z7901 Long term (current) use of anticoagulants: Secondary | ICD-10-CM | POA: Insufficient documentation

## 2022-01-05 DIAGNOSIS — D6869 Other thrombophilia: Secondary | ICD-10-CM | POA: Insufficient documentation

## 2022-01-05 DIAGNOSIS — I4819 Other persistent atrial fibrillation: Secondary | ICD-10-CM | POA: Insufficient documentation

## 2022-01-05 DIAGNOSIS — I6523 Occlusion and stenosis of bilateral carotid arteries: Secondary | ICD-10-CM | POA: Insufficient documentation

## 2022-01-05 DIAGNOSIS — Z95820 Peripheral vascular angioplasty status with implants and grafts: Secondary | ICD-10-CM | POA: Diagnosis not present

## 2022-01-05 DIAGNOSIS — I130 Hypertensive heart and chronic kidney disease with heart failure and stage 1 through stage 4 chronic kidney disease, or unspecified chronic kidney disease: Secondary | ICD-10-CM | POA: Diagnosis not present

## 2022-01-05 DIAGNOSIS — Z79899 Other long term (current) drug therapy: Secondary | ICD-10-CM | POA: Diagnosis not present

## 2022-01-05 DIAGNOSIS — I739 Peripheral vascular disease, unspecified: Secondary | ICD-10-CM | POA: Diagnosis not present

## 2022-01-05 DIAGNOSIS — I5032 Chronic diastolic (congestive) heart failure: Secondary | ICD-10-CM | POA: Diagnosis not present

## 2022-01-05 NOTE — Progress Notes (Signed)
Primary Care Physician: [name] Bott, MD Primary Cardiologist: Dr [name] Primary Electrophysiologist: none Referring Physician: [name] is a 76 y.o. male with a history of chronic diastolic CHF, HTN, CKD, bilateral carotid stenosis, atrial fibrillation who presents for follow up in the [facility]. Patient is on Eliquis for a CHADS2VASC score of 4. Patient seen by [name] 05/04/21 and found to be back in afib. He does have symptoms of fatigue and SOB. He had redo LCEA and L CCA bypass on 4/6/222 and had a seroma on his thigh which is healing, no interventions planned. Patient was scheduled for DCCV on 06/02/21 but he had missed a dose of Eliquis and this was rescheduled. Patient is s/p DCCV on 06/22/21.   On follow up today, patient reports he has done well from a cardiac standpoint. He denies any episodes of afib in the interim. He primary concern is upper respiratory congestions x 3 weeks. He is seeing his PCP tomorrow for this. He denies any bleeding issues on anticoagulation.   Today, he denies symptoms of palpitations, chest pain, orthopnea, PND, lower extremity edema, dizziness, presyncope, syncope, snoring, daytime somnolence, bleeding, or neurologic sequela. The patient is tolerating medications without difficulties and is otherwise without complaint today.    Atrial Fibrillation Risk Factors:  he does not have symptoms or diagnosis of sleep apnea. he does not have a history of rheumatic fever.   he has a BMI of Body mass index is 28.5 kg/m.[name] Filed Weights   01/05/22 1027  Weight: 98 kg      Family History  Problem Relation Age of Onset   Hypertension Mother    Hyperlipidemia Mother      Atrial Fibrillation Management history:  Previous antiarrhythmic drugs: none Previous cardioversions: 11/2019, 06/22/21 Previous ablations: none CHADS2VASC score: 7 Anticoagulation history: Eliquis   Past Medical History:   Diagnosis Date   Arthritis    CKD (chronic kidney disease), stage III (H[facility])    COPD (chronic obstructive pulmonary disease) (HCC)    Dyspnea on exertion    Edema    HTN (hypertension)    Hyperlipidemia    Left carotid stenosis    a. 2007 s/p L CEA;  b. 2011 s/p L common carotid stenting 2/2 restenosis;  c. 0/5397 u/s: RICA <67, LICA 34-19%, patent LCCA stent.   PAD (peripheral artery disease) (HCC)    a. s/p prior RSFA, REIA, distal LSFA (known to be occluded), LEIA, and LCIA stenting;  b. 01/2015 ABI's: R 0.78, L 0.53.   Stroke [facility]) 03/2021   no residual    Past Surgical History:  Procedure Laterality Date   ABDOMINAL AORTAGRAM N/A 02/05/2015   Procedure: ABDOMINAL AORTAGRAM;  Surgeon: [name] Corwin, MD;  Location: [facility] CATH LAB;  Service: Cardiovascular;  Laterality: N/A;   AORTIC ARCH ANGIOGRAPHY N/A 07/19/2019   Procedure: AORTIC ARCH ANGIOGRAPHY;  Surgeon: [name] Dutch, MD;  Location: [place] CV LAB;  Service: Cardiovascular;  Laterality: N/A;   CARDIOVERSION N/A 11/06/2019   Procedure: CARDIOVERSION;  Surgeon: [name] Casino, MD;  Location[facility] ENDOSCOPY;  Service: Cardiovascular;  Laterality: N/A;   CARDIOVERSION N/A 06/22/2021   Procedure: CARDIOVERSION;  Surgeon: [name] Perla, MD;  Location: [place];  Service: Cardiovascular;  Laterality: N/A;   CAROTID ANGIOGRAPHY N/A 07/19/2019   Procedure: CAROTID ANGIOGRAPHY;  Surgeon: [name] Dutch, MD;  Location: [place] CV LAB;  Service: Cardiovascular;  Laterality: N/A;   carotid endarterecotomy  CAROTID PTA/STENT INTERVENTION Left 08/02/2019   Procedure: CAROTID PTA/STENT INTERVENTION;  Surgeon: [name] Dutch, MD;  Location: [place] CV LAB;  Service: Cardiovascular;  Laterality: Left;   ENDARTERECTOMY Left 03/10/2021   Procedure: REDO LEFT CAROTID ENDARTERECTOMY WITH LEFT COMMON TO INTERNAL CAROTID ARTERY BYPASS;  Surgeon: [name] Heck, MD;  Location: [place];  Service: Vascular;  Laterality: Left;    external iliac     status post bilateral and left common iliac artery   INCISION AND DRAINAGE Left 03/31/2021   Procedure: INCISION AND DRAINAGE OF LEFT THIGH WITH DRAIN PLACEMENT;  Surgeon: [name] Heck, MD;  Location: [place];  Service: Vascular;  Laterality: Left;   LIGATION OF ARTERIOVENOUS  FISTULA Left 03/10/2021   Procedure: LIGATION OF LEFT EXTERNAL CAROTID;  Surgeon: [name] Heck, MD;  Location: [place];  Service: Vascular;  Laterality: Left;   lumbar back surgery     x2   LUMBAR DISC SURGERY     x 2   stnt     lower extermity   TENDON REPAIR     to the left arm   TONSILLECTOMY     VEIN HARVEST Left 03/10/2021   Procedure: VEIN HARVEST FROM LEFT SAPHENOUS VEIN;  Surgeon: [name] Heck, MD;  Location: [facility] OR;  Service: Vascular;  Laterality: Left;    Current Outpatient Medications  Medication Sig Dispense Refill   albuterol (VENTOLIN HFA) 108 (90 Base) MCG/ACT inhaler Inhale 1-2 puffs into the lungs every 6 (six) hours as needed for shortness of breath or wheezing.     allopurinol (ZYLOPRIM) 300 MG tablet Take 300 mg by mouth daily.     amLODipine (NORVASC) 10 MG tablet Take 10 mg by mouth daily.     aspirin EC 81 MG tablet Take 1 tablet (81 mg total) by mouth daily. 90 tablet 3   azelastine (ASTELIN) 0.1 % nasal spray Place 1-2 sprays into both nostrils 2 (two) times daily as needed for rhinitis. Use in each nostril as directed     baclofen (LIORESAL) 10 MG tablet Take 0.5 tablets (5 mg total) by mouth 3 (three) times daily. 30 each 0   carvedilol (COREG) 6.25 MG tablet Take 1 tablet (6.25 mg total) by mouth 2 (two) times daily. 180 tablet 3   diphenhydrAMINE (BENADRYL) 25 MG tablet Take 25 mg by mouth every 6 (six) hours as needed for allergies.     ELIQUIS 5 MG TABS tablet TAKE 1 TABLET(5 MG) BY MOUTH TWICE DAILY 180 tablet 3   furosemide (LASIX) 40 MG tablet Take 40 mg by mouth 2 (two) times daily.     gabapentin (NEURONTIN) 300 MG capsule Take 300 mg by  mouth 2 (two) times daily.     ibuprofen (ADVIL) 200 MG tablet Take 200 mg by mouth every 6 (six) hours as needed for mild pain or moderate pain.     losartan-hydrochlorothiazide (HYZAAR) 50-12.5 MG tablet Take 1 tablet by mouth daily.     Magnesium Oxide 400 MG CAPS Take 1 capsule (400 mg total) by mouth in the morning and at bedtime. 180 capsule 3   omeprazole (PRILOSEC) 20 MG capsule Take 20 mg by mouth daily with breakfast.      oxyCODONE-acetaminophen (PERCOCET) 7.5-325 MG tablet Take 0.5 tablets by mouth every 4 (four) hours as needed for moderate pain.     potassium chloride (KLOR-CON) 10 MEQ tablet TAKE 1 TABLET(10 MEQ) BY MOUTH DAILY 90 tablet 3   rosuvastatin (CRESTOR) 10 MG tablet  Take 10 mg by mouth daily.     TRELEGY ELLIPTA 100-62.5-25 MCG/INH AEPB Inhale 1 puff into the lungs at bedtime.      No current facility-administered medications for this encounter.    Allergies  Allergen Reactions   Plavix [Clopidogrel] Rash    Social History   Socioeconomic History   Marital status: Married    Spouse name: Vicky   Number of children: Not on file   Years of education: Not on file   Highest education level: Not on file  Occupational History   Not on file  Tobacco Use   Smoking status: Every Day    Packs/day: 1.00    Years: 50.00    Pack years: 50.00    Types: Cigarettes   Smokeless tobacco: Never   Tobacco comments:    Pack daily 01/05/22  Vaping Use   Vaping Use: Never used  Substance and Sexual Activity   Alcohol use: Yes    Alcohol/week: 7.0 standard drinks    Types: 7 Glasses of wine per week    Comment: 1 glass of wine daily 01/05/22   Drug use: No   Sexual activity: Not on file  Other Topics Concern   Not on file  Social History Narrative   Not on file   Social Determinants of Health   Financial Resource Strain: Low Risk    Difficulty of Paying Living Expenses: Not hard at all  Food Insecurity: No Food Insecurity   Worried About Charity fundraiser  in the Last Year: Never true   [place] in the Last Year: Never true  Transportation Needs: No Transportation Needs   Lack of Transportation (Medical): No   Lack of Transportation (Non-Medical): No  Physical Activity: Not on file  Stress: No Stress Concern Present   Feeling of Stress : Not at all  Social Connections: Not on file  Intimate Partner Violence: Not At Risk   Fear of Current or Ex-Partner: No   Emotionally Abused: No   Physically Abused: No   Sexually Abused: No     ROS- All systems are reviewed and negative except as per the HPI above.  Physical Exam: Vitals:   01/05/22 1027  BP: (!) 146/60  Pulse: 62  Weight: 98 kg  Height: 6\' 1"  (1.854 m)    GEN- The patient is a well appearing elderly male, alert and oriented x 3 today.   HEENT-head normocephalic, atraumatic, sclera clear, conjunctiva pink, hearing intact, trachea midline. Lungs- Clear to ausculation bilaterally, normal work of breathing Heart- Regular rate and rhythm, no murmurs, rubs or gallops  GI- soft, NT, ND, + BS Extremities- no clubbing, cyanosis, or edema MS- no significant deformity or atrophy Skin- no rash or lesion Psych- euthymic mood, full affect Neuro- strength and sensation are intact   Wt Readings from Last 3 Encounters:  01/05/22 98 kg  10/01/21 97.6 kg  06/30/21 103.9 kg    EKG today demonstrates  SR, PAC Vent. rate 62 BPM PR interval 136 ms QRS duration 90 ms QT/QTcB 400/406 ms  Echo 03/10/21 demonstrated   1. Left ventricular ejection fraction, by estimation, is 55 to 60%. The  left ventricle has normal function. The left ventricle has no regional  wall motion abnormalities. There is mild left ventricular hypertrophy.  Left ventricular diastolic parameters  are consistent with Grade I diastolic dysfunction (impaired relaxation).   2. Right ventricular systolic function is normal. The right ventricular  size is normal.   3.  The mitral valve is grossly normal. Trivial  mitral valve  regurgitation.   4. The aortic valve is tricuspid. Aortic valve regurgitation is not  visualized.   5. The inferior vena cava is normal in size with <50% respiratory  variability, suggesting right atrial pressure of 8 mmHg.   Comparison(s): No significant change from prior study. 08/27/2019: LVEF  55-60%.   Epic records are reviewed at length today  CHA2DS2-VASc Score = 7  The patient's score is based upon: CHF History: 1 HTN History: 1 Diabetes History: 0 Stroke History: 2 Vascular Disease History: 1 Age Score: 2 Gender Score: 0      ASSESSMENT AND PLAN: 1. Persistent Atrial Fibrillation (ICD10:  I48.19) The patient's CHA2DS2-VASc score is 7, indicating a 11.2% annual risk of stroke.   S/p DCCV 06/22/21 Patient appears to be maintaining SR. Continue carvedilol 6.25 mg BID Continue Eliquis 5 mg BID  2. Secondary Hypercoagulable State (ICD10:  D68.69) The patient is at significant risk for stroke/thromboembolism based upon his CHA2DS2-VASc Score of 7.  Continue Apixaban (Eliquis).   3. HTN Stable, no changes today.  4. Carotid artery disease  S/p redo LCEA and L CCA bypass 03/2021.   Follow up in the AF clinic in 6 months.    [facility] [address] [place], [place] [address] [phone] 01/05/2022 10:36 AM

## 2022-02-26 ENCOUNTER — Other Ambulatory Visit: Payer: Self-pay

## 2022-02-26 DIAGNOSIS — I6523 Occlusion and stenosis of bilateral carotid arteries: Secondary | ICD-10-CM

## 2022-03-07 ENCOUNTER — Ambulatory Visit (INDEPENDENT_AMBULATORY_CARE_PROVIDER_SITE_OTHER): Payer: Medicare Other | Admitting: Physician Assistant

## 2022-03-07 ENCOUNTER — Ambulatory Visit (HOSPITAL_COMMUNITY)
Admission: RE | Admit: 2022-03-07 | Discharge: 2022-03-07 | Disposition: A | Discharge status: Home / Self Care | Payer: Medicare Other | Source: Ambulatory Visit | Attending: Physician Assistant | Admitting: Physician Assistant

## 2022-03-07 VITALS — BP 125/74 | HR 85 | Temp 97.9°F | Resp 20 | Ht 73.0 in | Wt 224.7 lb

## 2022-03-07 DIAGNOSIS — I6523 Occlusion and stenosis of bilateral carotid arteries: Secondary | ICD-10-CM

## 2022-03-07 NOTE — Progress Notes (Signed)
? ? ? ?History of Present Illness:  Patient is a 76 y.o. year old male who presents for evaluation of carotid stenosis.  He has a history of redo left CEA with harvest of left GSV, ligation of left external carotid artery, left CCA to ICA bypass with GSV on 03/10/2021 by Dr. Carlis Abbott.  He subsequently underwent  I&D of left thigh seroma at saphenectomy site and placement of JP drain on 03/31/2021 by Dr. Carlis Abbott.   ? He underwent left CEA in 2007 for asymptomatic left ICS stenosis by Dr. Oneida Alar.  He then underwent left TCAR due to left ICA restenosis in 2011 and again in 2020. ? ?The patient denies symptoms of TIA, amaurosis, aphasia, extremity weakness unilaterally, or stroke. He denise claudication, rest pain and non healing wounds.  He has a history of PAD as well that is asymptomatic.   ? ?History of CKD, atrial fibrillation on Eliquis, ASA, and Crestor  ?Smokes approximately one PPD. ? ?Past Medical History:  ?Diagnosis Date  ? Arthritis   ? CKD (chronic kidney disease), stage III ([place])   ? COPD (chronic obstructive pulmonary disease) ([place])   ? Dyspnea on exertion   ? Edema   ? HTN (hypertension)   ? Hyperlipidemia   ? Left carotid stenosis   ? a. 2007 s/p L CEA;  b. 2011 s/p L common carotid stenting 2/2 restenosis;  c. 12/5398 u/s: RICA <86, LICA 76-19%, patent LCCA stent.  ? PAD (peripheral artery disease) ([place])   ? a. s/p prior RSFA, REIA, distal LSFA (known to be occluded), LEIA, and LCIA stenting;  b. 01/2015 ABI's: R 0.78, L 0.53.  ? Stroke [facility]) 03/2021  ? no residual   ? ? ?Past Surgical History:  ?Procedure Laterality Date  ? ABDOMINAL AORTAGRAM N/A 02/05/2015  ? Procedure: ABDOMINAL AORTAGRAM;  Surgeon: Conrad Greene, MD;  Location: [facility] CATH LAB;  Service: Cardiovascular;  Laterality: N/A;  ? AORTIC ARCH ANGIOGRAPHY N/A 07/19/2019  ? Procedure: AORTIC ARCH ANGIOGRAPHY;  Surgeon: Elam Dutch, MD;  Location: [place] CV LAB;  Service: Cardiovascular;  Laterality: N/A;  ? CARDIOVERSION N/A 11/06/2019  ?  Procedure: CARDIOVERSION;  Surgeon: Pixie Casino, MD;  Location: [place];  Service: Cardiovascular;  Laterality: N/A;  ? CARDIOVERSION N/A 06/22/2021  ? Procedure: CARDIOVERSION;  Surgeon: Lelon Perla, MD;  Location: [place];  Service: Cardiovascular;  Laterality: N/A;  ? CAROTID ANGIOGRAPHY N/A 07/19/2019  ? Procedure: CAROTID ANGIOGRAPHY;  Surgeon: Elam Dutch, MD;  Location: [place] CV LAB;  Service: Cardiovascular;  Laterality: N/A;  ? carotid endarterecotomy    ? CAROTID PTA/STENT INTERVENTION Left 08/02/2019  ? Procedure: CAROTID PTA/STENT INTERVENTION;  Surgeon: Elam Dutch, MD;  Location: [place] CV LAB;  Service: Cardiovascular;  Laterality: Left;  ? ENDARTERECTOMY Left 03/10/2021  ? Procedure: REDO LEFT CAROTID ENDARTERECTOMY WITH LEFT COMMON TO INTERNAL CAROTID ARTERY BYPASS;  Surgeon: Marty Heck, MD;  Location: [facility] OR;  Service: Vascular;  Laterality: Left;  ? external iliac    ? status post bilateral and left common iliac artery  ? INCISION AND DRAINAGE Left 03/31/2021  ? Procedure: INCISION AND DRAINAGE OF LEFT THIGH WITH DRAIN PLACEMENT;  Surgeon: Marty Heck, MD;  Location[facility] OR;  Service: Vascular;  Laterality: Left;  ? LIGATION OF ARTERIOVENOUS  FISTULA Left 03/10/2021  ? Procedure: LIGATION OF LEFT EXTERNAL CAROTID;  Surgeon: Marty Heck, MD;  Location: [place];  Service: Vascular;  Laterality: Left;  ? lumbar  back surgery    ? x2  ? LUMBAR DISC SURGERY    ? x 2  ? stnt    ? lower extermity  ? TENDON REPAIR    ? to the left arm  ? TONSILLECTOMY    ? VEIN HARVEST Left 03/10/2021  ? Procedure: VEIN HARVEST FROM LEFT SAPHENOUS VEIN;  Surgeon: Marty Heck, MD;  Location: [place];  Service: Vascular;  Laterality: Left;  ? ? ? ?Social History ?Social History  ? ?Tobacco Use  ? Smoking status: Every Day  ?  Packs/day: 1.00  ?  Years: 50.00  ?  Pack years: 50.00  ?  Types: Cigarettes  ?  Passive exposure: Current  ? Smokeless tobacco: Never  ?  Tobacco comments:  ?  Pack daily 01/05/22  ?Vaping Use  ? Vaping Use: Never used  ?Substance Use Topics  ? Alcohol use: Yes  ?  Alcohol/week: 7.0 standard drinks  ?  Types: 7 Glasses of wine per week  ?  Comment: 1 glass of wine daily 01/05/22  ? Drug use: No  ? ? ?Family History ?Family History  ?Problem Relation Age of Onset  ? Hypertension Mother   ? Hyperlipidemia Mother   ? ? ?Allergies ? ?Allergies  ?Allergen Reactions  ? Plavix [Clopidogrel] Rash  ? ? ? ?Current Outpatient Medications  ?Medication Sig Dispense Refill  ? albuterol (VENTOLIN HFA) 108 (90 Base) MCG/ACT inhaler Inhale 1-2 puffs into the lungs every 6 (six) hours as needed for shortness of breath or wheezing.    ? allopurinol (ZYLOPRIM) 300 MG tablet Take 300 mg by mouth daily.    ? amLODipine (NORVASC) 10 MG tablet Take 10 mg by mouth daily.    ? aspirin EC 81 MG tablet Take 1 tablet (81 mg total) by mouth daily. 90 tablet 3  ? azelastine (ASTELIN) 0.1 % nasal spray Place 1-2 sprays into both nostrils 2 (two) times daily as needed for rhinitis. Use in each nostril as directed    ? baclofen (LIORESAL) 10 MG tablet Take 0.5 tablets (5 mg total) by mouth 3 (three) times daily. 30 each 0  ? carvedilol (COREG) 6.25 MG tablet Take 1 tablet (6.25 mg total) by mouth 2 (two) times daily. 180 tablet 3  ? diphenhydrAMINE (BENADRYL) 25 MG tablet Take 25 mg by mouth every 6 (six) hours as needed for allergies.    ? ELIQUIS 5 MG TABS tablet TAKE 1 TABLET(5 MG) BY MOUTH TWICE DAILY 180 tablet 3  ? furosemide (LASIX) 40 MG tablet Take 40 mg by mouth 2 (two) times daily.    ? gabapentin (NEURONTIN) 300 MG capsule Take 300 mg by mouth 2 (two) times daily.    ? ibuprofen (ADVIL) 200 MG tablet Take 200 mg by mouth every 6 (six) hours as needed for mild pain or moderate pain.    ? losartan-hydrochlorothiazide (HYZAAR) 50-12.5 MG tablet Take 1 tablet by mouth daily.    ? Magnesium Oxide 400 MG CAPS Take 1 capsule (400 mg total) by mouth in the morning and at bedtime.  180 capsule 3  ? omeprazole (PRILOSEC) 20 MG capsule Take 20 mg by mouth daily with breakfast.     ? oxyCODONE-acetaminophen (PERCOCET) 7.5-325 MG tablet Take 0.5 tablets by mouth every 4 (four) hours as needed for moderate pain.    ? potassium chloride (KLOR-CON) 10 MEQ tablet TAKE 1 TABLET(10 MEQ) BY MOUTH DAILY 90 tablet 3  ? rosuvastatin (CRESTOR) 10 MG tablet Take 10 mg by  mouth daily.    ? TRELEGY ELLIPTA 100-62.5-25 MCG/INH AEPB Inhale 1 puff into the lungs at bedtime.     ? ?No current facility-administered medications for this visit.  ? ? ?ROS:  ? ?General:  No weight loss, Fever, chills ? ?HEENT: No recent headaches, no nasal bleeding, no visual changes, no sore throat ? ?Neurologic: No dizziness, blackouts, seizures. No recent symptoms of stroke or mini- stroke. No recent episodes of slurred speech, or temporary blindness. ? ?Cardiac: No recent episodes of chest pain/pressure, no shortness of breath at rest.  No shortness of breath with exertion.  Denies history of atrial fibrillation or irregular heartbeat ? ?Vascular: No history of rest pain in feet.  No history of claudication.  No history of non-healing ulcer, No history of DVT  ? ?Pulmonary: No home oxygen, no productive cough, no hemoptysis,  No asthma or wheezing ? ?Musculoskeletal:  [ ]  Arthritis, [ ]  Low back pain,  [ ]  Joint pain ? ?Hematologic:No history of hypercoagulable state.  No history of easy bleeding.  No history of anemia ? ?Gastrointestinal: No hematochezia or melena,  No gastroesophageal reflux, no trouble swallowing ? ?Urinary: [ ]  chronic Kidney disease, [ ]  on HD - [ ]  MWF or [ ]  TTHS, [ ]  Burning with urination, [ ]  Frequent urination, [ ]  Difficulty urinating;  ? ?Skin: No rashes ? ?Psychological: No history of anxiety,  No history of depression ? ? ?Physical Examination ? ?Vitals:  ? 03/07/22 1334 03/07/22 1336  ?BP: 128/73 125/74  ?Pulse: 85   ?Resp: 20   ?Temp: 97.9 ?F (36.6 ?C)   ?TempSrc: Temporal   ?SpO2: 95%   ?Weight:  224 lb 11.2 oz (101.9 kg)   ?Height: 6\' 1"  (1.854 m)   ? ? ?Body mass index is 29.65 kg/m?. ? ?General:  Alert and oriented, no acute distress ?HEENT: Normal ?Neck: No bruit or JVD ?Pulmonary: Clear to au

## 2022-04-15 ENCOUNTER — Ambulatory Visit (INDEPENDENT_AMBULATORY_CARE_PROVIDER_SITE_OTHER): Payer: Medicare Other | Admitting: Cardiovascular Disease

## 2022-04-15 ENCOUNTER — Encounter: Payer: Self-pay | Admitting: Cardiovascular Disease

## 2022-04-15 VITALS — BP 120/70 | HR 98 | Ht 72.0 in | Wt 226.4 lb

## 2022-04-15 DIAGNOSIS — I10 Essential (primary) hypertension: Secondary | ICD-10-CM | POA: Diagnosis not present

## 2022-04-15 DIAGNOSIS — I4819 Other persistent atrial fibrillation: Secondary | ICD-10-CM | POA: Diagnosis not present

## 2022-04-15 DIAGNOSIS — I5032 Chronic diastolic (congestive) heart failure: Secondary | ICD-10-CM

## 2022-04-15 DIAGNOSIS — Z720 Tobacco use: Secondary | ICD-10-CM | POA: Diagnosis not present

## 2022-04-15 DIAGNOSIS — I6523 Occlusion and stenosis of bilateral carotid arteries: Secondary | ICD-10-CM

## 2022-04-15 MED ORDER — AMLODIPINE BESYLATE 5 MG PO TABS: 5.0000 mg | ORAL_TABLET | Freq: Every day | ORAL | 3 refills | Status: DC

## 2022-04-15 MED ORDER — CARVEDILOL 12.5 MG PO TABS: 12.5000 mg | ORAL_TABLET | Freq: Two times a day (BID) | ORAL | 3 refills | Status: DC

## 2022-04-15 NOTE — Progress Notes (Signed)
?Cardiology Office Note:   ? ?Date:  04/15/2022  ? ?ID:  [name], DOB 12-17-1945, MRN [id number] ? ?PCP:  Raelene Bott, MD ?  ?[facility] Providers ?Cardiologist:  Sherren Mocha, MD ?Cardiology APP:  Liliane Shi, PA-C    ? ?Referring MD: Raelene Bott, MD  ? ?Chief Complaint  ?Patient presents with  ? Shortness of Breath  ? ? ?History of Present Illness:   ? ?[name] is a 76 y.o. male with a hx of chronic diastolic heart failure, HTN, CKD, bilateral carotid artery stenosis, persistent atrial fibrillation, and CVA. ? ?The patient had cardioversion last year and was in sinus rhythm as recently as January 05, 2022 when he had an EKG confirming this.  He does not know if he is in atrial fibrillation or sinus rhythm.  He had an echocardiogram at another center which I was able to review today.  That was performed in April when he was noted to be in atrial fibrillation at the time of his echocardiogram based on their report.  The patient denies heart palpitations.  He has chronic dyspnea and continues to smoke cigarettes.  He does not seem able to quit.  He has chronic leg swelling and takes diuretic therapy but has had to be careful because of his kidney dysfunction.  He denies any chest pain or pressure.  No lightheadedness or syncope. ? ? ? ?Past Medical History:  ?Diagnosis Date  ? Arthritis   ? CKD (chronic kidney disease), stage III ([place])   ? COPD (chronic obstructive pulmonary disease) ([place])   ? Dyspnea on exertion   ? Edema   ? HTN (hypertension)   ? Hyperlipidemia   ? Left carotid stenosis   ? a. 2007 s/p L CEA;  b. 2011 s/p L common carotid stenting 2/2 restenosis;  c. 05/2951 u/s: RICA <84, LICA 13-24%, patent LCCA stent.  ? PAD (peripheral artery disease) ([place])   ? a. s/p prior RSFA, REIA, distal LSFA (known to be occluded), LEIA, and LCIA stenting;  b. 01/2015 ABI's: R 0.78, L 0.53.  ? Stroke [facility]) 03/2021  ? no residual   ? ? ?Past Surgical History:  ?Procedure Laterality Date  ?  ABDOMINAL AORTAGRAM N/A 02/05/2015  ? Procedure: ABDOMINAL AORTAGRAM;  Surgeon: Conrad Corinth, MD;  Location: [facility] CATH LAB;  Service: Cardiovascular;  Laterality: N/A;  ? AORTIC ARCH ANGIOGRAPHY N/A 07/19/2019  ? Procedure: AORTIC ARCH ANGIOGRAPHY;  Surgeon: Elam Dutch, MD;  Location: [place] CV LAB;  Service: Cardiovascular;  Laterality: N/A;  ? CARDIOVERSION N/A 11/06/2019  ? Procedure: CARDIOVERSION;  Surgeon: Pixie Casino, MD;  Location: [place];  Service: Cardiovascular;  Laterality: N/A;  ? CARDIOVERSION N/A 06/22/2021  ? Procedure: CARDIOVERSION;  Surgeon: Lelon Perla, MD;  Location: [place];  Service: Cardiovascular;  Laterality: N/A;  ? CAROTID ANGIOGRAPHY N/A 07/19/2019  ? Procedure: CAROTID ANGIOGRAPHY;  Surgeon: Elam Dutch, MD;  Location: [place] CV LAB;  Service: Cardiovascular;  Laterality: N/A;  ? carotid endarterecotomy    ? CAROTID PTA/STENT INTERVENTION Left 08/02/2019  ? Procedure: CAROTID PTA/STENT INTERVENTION;  Surgeon: Elam Dutch, MD;  Location: [place] CV LAB;  Service: Cardiovascular;  Laterality: Left;  ? ENDARTERECTOMY Left 03/10/2021  ? Procedure: REDO LEFT CAROTID ENDARTERECTOMY WITH LEFT COMMON TO INTERNAL CAROTID ARTERY BYPASS;  Surgeon: Marty Heck, MD;  Location: [facility] OR;  Service: Vascular;  Laterality: Left;  ? external iliac    ? status post bilateral  and left common iliac artery  ? INCISION AND DRAINAGE Left 03/31/2021  ? Procedure: INCISION AND DRAINAGE OF LEFT THIGH WITH DRAIN PLACEMENT;  Surgeon: Marty Heck, MD;  Location: [facility] OR;  Service: Vascular;  Laterality: Left;  ? LIGATION OF ARTERIOVENOUS  FISTULA Left 03/10/2021  ? Procedure: LIGATION OF LEFT EXTERNAL CAROTID;  Surgeon: Marty Heck, MD;  Location: [facility] OR;  Service: Vascular;  Laterality: Left;  ? lumbar back surgery    ? x2  ? LUMBAR DISC SURGERY    ? x 2  ? stnt    ? lower extermity  ? TENDON REPAIR    ? to the left arm  ? TONSILLECTOMY    ? VEIN HARVEST  Left 03/10/2021  ? Procedure: VEIN HARVEST FROM LEFT SAPHENOUS VEIN;  Surgeon: Marty Heck, MD;  Location: [place];  Service: Vascular;  Laterality: Left;  ? ? ?Current Medications: ?Current Meds  ?Medication Sig  ? albuterol (VENTOLIN HFA) 108 (90 Base) MCG/ACT inhaler Inhale 1-2 puffs into the lungs every 6 (six) hours as needed for shortness of breath or wheezing.  ? allopurinol (ZYLOPRIM) 300 MG tablet Take 300 mg by mouth daily.  ? amLODipine (NORVASC) 5 MG tablet Take 1 tablet (5 mg total) by mouth daily.  ? aspirin EC 81 MG tablet Take 1 tablet (81 mg total) by mouth daily.  ? azelastine (ASTELIN) 0.1 % nasal spray Place 1-2 sprays into both nostrils 2 (two) times daily as needed for rhinitis. Use in each nostril as directed  ? baclofen (LIORESAL) 10 MG tablet Take 0.5 tablets (5 mg total) by mouth 3 (three) times daily.  ? carvedilol (COREG) 12.5 MG tablet Take 1 tablet (12.5 mg total) by mouth 2 (two) times daily.  ? ELIQUIS 5 MG TABS tablet TAKE 1 TABLET(5 MG) BY MOUTH TWICE DAILY  ? furosemide (LASIX) 40 MG tablet Take 40 mg by mouth 2 (two) times daily.  ? gabapentin (NEURONTIN) 300 MG capsule Take 300 mg by mouth 2 (two) times daily.  ? losartan-hydrochlorothiazide (HYZAAR) 50-12.5 MG tablet Take 1 tablet by mouth daily.  ? Magnesium Oxide 400 MG CAPS Take 1 capsule (400 mg total) by mouth in the morning and at bedtime.  ? methylPREDNISolone (MEDROL DOSEPAK) 4 MG TBPK tablet See admin instructions. follow package directions  ? omeprazole (PRILOSEC) 20 MG capsule Take 20 mg by mouth daily with breakfast.   ? oxyCODONE-acetaminophen (PERCOCET) 7.5-325 MG tablet Take 0.5 tablets by mouth every 4 (four) hours as needed for moderate pain.  ? potassium chloride (KLOR-CON) 10 MEQ tablet TAKE 1 TABLET(10 MEQ) BY MOUTH DAILY  ? rosuvastatin (CRESTOR) 10 MG tablet Take 10 mg by mouth daily.  ? TRELEGY ELLIPTA 100-62.5-25 MCG/INH AEPB Inhale 1 puff into the lungs at bedtime.   ? [DISCONTINUED] amLODipine  (NORVASC) 10 MG tablet Take 10 mg by mouth daily.  ? [DISCONTINUED] carvedilol (COREG) 6.25 MG tablet Take 1 tablet (6.25 mg total) by mouth 2 (two) times daily.  ?  ? ?Allergies:   Plavix [clopidogrel]  ? ?Social History  ? ?Socioeconomic History  ? Marital status: Married  ?  Spouse name: Olegario Shearer  ? Number of children: Not on file  ? Years of education: Not on file  ? Highest education level: Not on file  ?Occupational History  ? Not on file  ?Tobacco Use  ? Smoking status: Every Day  ?  Packs/day: 1.00  ?  Years: 50.00  ?  Pack years: 50.00  ?  Types: Cigarettes  ?  Passive exposure: Current  ? Smokeless tobacco: Never  ? Tobacco comments:  ?  Pack daily 01/05/22  ?Vaping Use  ? Vaping Use: Never used  ?Substance and Sexual Activity  ? Alcohol use: Yes  ?  Alcohol/week: 7.0 standard drinks  ?  Types: 7 Glasses of wine per week  ?  Comment: 1 glass of wine daily 01/05/22  ? Drug use: No  ? Sexual activity: Not on file  ?Other Topics Concern  ? Not on file  ?Social History Narrative  ? Not on file  ? ?Social Determinants of Health  ? ?Financial Resource Strain: Not on file  ?Food Insecurity: Not on file  ?Transportation Needs: Not on file  ?Physical Activity: Not on file  ?Stress: Not on file  ?Social Connections: Not on file  ?  ? ?Family History: ?The patient's family history includes Hyperlipidemia in his mother; Hypertension in his mother. ? ?ROS:   ?Please see the history of present illness.    ?All other systems reviewed and are negative. ? ?EKGs/Labs/Other Studies Reviewed:   ? ?The following studies were reviewed today: ?Echo 03/23/22: ?Summary  ?  1. The left ventricle is normal in size with mildly increased wall  ?thickness.  ?  2. The left ventricular systolic function is normal, LVEF is visually  ?estimated at 55-60%.  ?  3. The right ventricle is normal in size, with normal systolic function.  ?  4. The left atrium is mildly dilated in size.  ?  5. The right atrium is mildly dilated  in size.  ?  6. There  is mild tricuspid regurgitation.  ?  7. There is no pulmonary hypertension.  ?  8. IVC size and inspiratory change suggest mildly elevated right atrial  ?pressure. (5-10 mmHg).  ?  9. Rhythm: Atrial Fibrilla

## 2022-04-15 NOTE — Patient Instructions (Addendum)
Medication Instructions:  ?INCREASE Carvedilol to 12.5mg  twice daily ?DECREASE Amlodipine 5mg  daily ?*If you need a refill on your cardiac medications before your next appointment, please call your pharmacy* ? ? ?Lab Work: ?NONE ?If you have labs (blood work) drawn today and your tests are completely normal, you will receive your results only by: ?MyChart Message (if you have MyChart) OR ?A paper copy in the mail ?If you have any lab test that is abnormal or we need to change your treatment, we will call you to review the results. ? ? ?Testing/Procedures: ?NONE ? ? ?Follow-Up: ?At [facility], you and your health needs are our priority.  As part of our continuing mission to provide you with exceptional heart care, we have created designated Provider Care Teams.  These Care Teams include your primary Cardiologist (physician) and Advanced Practice Providers (APPs -  Physician Assistants and Nurse Practitioners) who all work together to provide you with the care you need, when you need it. ? ?We recommend signing up for the patient portal called "MyChart".  Sign up information is provided on this After Visit Summary.  MyChart is used to connect with patients for Virtual Visits (Telemedicine).  Patients are able to view lab/test results, encounter notes, upcoming appointments, etc.  Non-urgent messages can be sent to your provider as well.   ?To learn more about what you can do with MyChart, go to [email or web address].   ? ?Your next appointment:   ?6 month(s) ? ?The format for your next appointment:   ?In Person ? ?Provider:   ?Ronn Melena, [place], then [place] in 1 year ? ?  ? ?Important Information About Sugar ? ? ? ? ?  ?

## 2022-05-05 ENCOUNTER — Other Ambulatory Visit: Payer: Self-pay | Admitting: Physician Assistant

## 2022-05-16 ENCOUNTER — Other Ambulatory Visit: Payer: Self-pay | Admitting: Nephrology

## 2022-05-16 ENCOUNTER — Other Ambulatory Visit (HOSPITAL_COMMUNITY): Payer: Self-pay | Admitting: Nephrology

## 2022-05-16 DIAGNOSIS — N055 Unspecified nephritic syndrome with diffuse mesangiocapillary glomerulonephritis: Secondary | ICD-10-CM

## 2022-05-16 DIAGNOSIS — N184 Chronic kidney disease, stage 4 (severe): Secondary | ICD-10-CM

## 2022-05-19 ENCOUNTER — Telehealth: Payer: Self-pay | Admitting: Physician Assistant

## 2022-05-19 NOTE — Telephone Encounter (Signed)
Patient with diagnosis of afib on Eliquis for anticoagulation.    Procedure:  Kidney biopsy Date of procedure: 06/06/22  CHA2DS2-VASc Score = 7   This indicates a 11.2% annual risk of stroke. The patient's score is based upon: CHF History: 1 HTN History: 1 Diabetes History: 0 Stroke History: 2 Vascular Disease History: 1 Age Score: 2 Gender Score: 0      CrCl 28 ml/min  ACC/AHA does not recommend bridging with DOAC. Patient has poor renal function. He is at high risk off anticoagulation due to hx of CVA.  Due to renal function, would recommend holding Eliquis for 3 days prior to procedure. Patient should resume anticoagulation as soon as safely possible.

## 2022-05-19 NOTE — Telephone Encounter (Signed)
   Pre-operative Risk Assessment    Patient Name: [name]  DOB: 1946-01-04 MRN: [id number]      Request for Surgical Clearance    Procedure:   Kidney biopsy  Date of Surgery:  Clearance 06/06/22                                 Surgeon:  Larence Penning Interventional Radiology   Type of Clearance Requested:   - Pharmacy:  Hold Aspirin and Apixaban (Eliquis)     Type of Anesthesia:  Not Indicated   Additional requests/questions:    [name]   05/19/2022, 9:03 AM

## 2022-05-19 NOTE — Telephone Encounter (Signed)
Ok to hold ASA for 7 days. Will route to PharmD for rec's re: holding anticoagulation. [name], PA-C    05/19/2022 9:06 AM

## 2022-05-19 NOTE — Telephone Encounter (Signed)
   Primary Cardiologist: Sherren Mocha, MD  Chart reviewed as part of pre-operative protocol coverage. Given past medical history and time since last visit, based on ACC/AHA guidelines, [name] would be at acceptable risk for the planned procedure without further cardiovascular testing.   ACC/AHA does not recommend bridging with DOAC. Patient has poor renal function. He is at high risk off anticoagulation due to hx of CVA.   Due to renal function, would recommend holding Eliquis for 3 days prior to procedure. Patient should resume anticoagulation as soon as safely possible.  Ok to hold ASA for 7 days.  I will route this recommendation to the requesting party via Epic fax function and remove from pre-op pool.  Please call with questions.   Emmaline Life, NP-C    05/19/2022, 12:07 PM [place] [address] Office [phone] Fax [phone]

## 2022-05-20 NOTE — Progress Notes (Unsigned)
Sherren Mocha, MD  Donita Brooks D Cc: Ramond Dial, RPH-CPP; Liliane Shi, PA-C I talked to Dr Hollie Salk and gave her the ok to hold eliquis for 48 hours        Previous Messages

## 2022-06-02 ENCOUNTER — Other Ambulatory Visit: Payer: Self-pay | Admitting: Radiology

## 2022-06-02 DIAGNOSIS — N189 Chronic kidney disease, unspecified: Secondary | ICD-10-CM

## 2022-06-03 ENCOUNTER — Other Ambulatory Visit (HOSPITAL_COMMUNITY): Payer: Self-pay | Admitting: Physician Assistant

## 2022-06-06 ENCOUNTER — Ambulatory Visit (HOSPITAL_COMMUNITY)
Admission: RE | Admit: 2022-06-06 | Discharge: 2022-06-06 | Disposition: A | Discharge status: Home / Self Care | Payer: Medicare Other | Source: Ambulatory Visit | Attending: Nephrology | Admitting: Nephrology

## 2022-06-06 ENCOUNTER — Other Ambulatory Visit: Payer: Self-pay

## 2022-06-06 DIAGNOSIS — N184 Chronic kidney disease, stage 4 (severe): Secondary | ICD-10-CM | POA: Insufficient documentation

## 2022-06-06 DIAGNOSIS — N055 Unspecified nephritic syndrome with diffuse mesangiocapillary glomerulonephritis: Secondary | ICD-10-CM | POA: Insufficient documentation

## 2022-06-06 DIAGNOSIS — N189 Chronic kidney disease, unspecified: Secondary | ICD-10-CM

## 2022-06-06 LAB — PROTIME-INR
INR: 1.2 (ref 0.8–1.2)
Prothrombin Time: 15.2 seconds (ref 11.4–15.2)

## 2022-06-06 LAB — CBC
HCT: 35.9 % — ABNORMAL LOW (ref 39.0–52.0)
Hemoglobin: 11.4 g/dL — ABNORMAL LOW (ref 13.0–17.0)
MCH: 28.2 pg (ref 26.0–34.0)
MCHC: 31.8 g/dL (ref 30.0–36.0)
MCV: 88.9 fL (ref 80.0–100.0)
Platelets: 179 10*3/uL (ref 150–400)
RBC: 4.04 MIL/uL — ABNORMAL LOW (ref 4.22–5.81)
RDW: 17.9 % — ABNORMAL HIGH (ref 11.5–15.5)
WBC: 10.9 10*3/uL — ABNORMAL HIGH (ref 4.0–10.5)
nRBC: 0 % (ref 0.0–0.2)

## 2022-06-06 MED ORDER — GELATIN ABSORBABLE 12-7 MM EX MISC
CUTANEOUS | Status: DC
Start: 2022-06-06 — End: 2022-06-06
  Filled 2022-06-06: qty 1

## 2022-06-06 MED ORDER — SODIUM CHLORIDE 0.9 % IV SOLN: INTRAVENOUS | Status: DC

## 2022-06-06 MED ORDER — FENTANYL CITRATE (PF) 100 MCG/2ML IJ SOLN
INTRAMUSCULAR | Status: AC | PRN
  Administered 2022-06-06: 50 ug via INTRAVENOUS

## 2022-06-06 MED ORDER — MIDAZOLAM HCL 2 MG/2ML IJ SOLN
INTRAMUSCULAR | Status: AC | PRN
  Administered 2022-06-06: 1 mg via INTRAVENOUS
  Administered 2022-06-06: .5 mg via INTRAVENOUS

## 2022-06-06 MED ORDER — FENTANYL CITRATE (PF) 100 MCG/2ML IJ SOLN
INTRAMUSCULAR | Status: AC
  Filled 2022-06-06: qty 2

## 2022-06-06 MED ORDER — LIDOCAINE HCL (PF) 1 % IJ SOLN
INTRAMUSCULAR | Status: AC
  Filled 2022-06-06: qty 30

## 2022-06-06 MED ORDER — MIDAZOLAM HCL 2 MG/2ML IJ SOLN
INTRAMUSCULAR | Status: AC
  Filled 2022-06-06: qty 2

## 2022-06-06 NOTE — Sedation Documentation (Signed)
Bx site is clean, dry and intact. No further drainage note from bandage site

## 2022-06-06 NOTE — Progress Notes (Signed)
Patient to resume eliquis on 7/5 per Dr. Laurence Ferrari

## 2022-06-06 NOTE — H&P (Signed)
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 male with chronic kidney disease under the care of Dr. Madelon Lips.  He underwent renal biopsy 10 years ago with IR here at [facility] and reports his new physician looking to do an updated workup.  He reports feeling overall well today, just a little tired.  Denies H/A, dizziness, lightheadedness, trouble swallowing, Chest pain, difficulty breathing, palpitations, abdominal pain, changes in urinary or bowel habits, changes in gait, paraesthesias, edema, rashes, wounds, or periods of confusion or agitation.  He has held his ASA and Eliquis for 3 days as per cardiology  Past Medical History:  Diagnosis Date   Arthritis    CKD (chronic kidney disease), stage III (HCC)    COPD (chronic obstructive pulmonary disease) (HCC)    Dyspnea on exertion    Edema    HTN (hypertension)    Hyperlipidemia    Left carotid stenosis    a. 2007 s/p L CEA;  b. 2011 s/p L common carotid stenting 2/2 restenosis;  c. 05/5680 u/s: RICA <27, LICA 51-70%, patent LCCA stent.   PAD (peripheral artery disease) (HCC)    a. s/p prior RSFA, REIA, distal LSFA (known to be occluded), LEIA, and LCIA stenting;  b. 01/2015 ABI's: R 0.78, L 0.53.   Stroke [facility]) 03/2021   no residual     Past Surgical History:  Procedure Laterality Date   ABDOMINAL AORTAGRAM N/A 02/05/2015   Procedure: ABDOMINAL AORTAGRAM;  Surgeon: Conrad Woodland, MD;  Location: [facility] CATH LAB;  Service: Cardiovascular;  Laterality: N/A;   AORTIC ARCH ANGIOGRAPHY N/A 07/19/2019   Procedure: AORTIC ARCH ANGIOGRAPHY;  Surgeon: Elam Dutch, MD;  Location: [place] CV LAB;  Service: Cardiovascular;  Laterality: N/A;   CARDIOVERSION N/A 11/06/2019    Procedure: CARDIOVERSION;  Surgeon: Pixie Casino, MD;  Location: [facility] ENDOSCOPY;  Service: Cardiovascular;  Laterality: N/A;   CARDIOVERSION N/A 06/22/2021   Procedure: CARDIOVERSION;  Surgeon: Lelon Perla, MD;  Location: [place];  Service: Cardiovascular;  Laterality: N/A;   CAROTID ANGIOGRAPHY N/A 07/19/2019   Procedure: CAROTID ANGIOGRAPHY;  Surgeon: Elam Dutch, MD;  Location: [place] CV LAB;  Service: Cardiovascular;  Laterality: N/A;   carotid endarterecotomy     CAROTID PTA/STENT INTERVENTION Left 08/02/2019   Procedure: CAROTID PTA/STENT INTERVENTION;  Surgeon: Elam Dutch, MD;  Location: [place] CV LAB;  Service: Cardiovascular;  Laterality: Left;   ENDARTERECTOMY Left 03/10/2021   Procedure: REDO LEFT CAROTID ENDARTERECTOMY WITH LEFT COMMON TO INTERNAL CAROTID ARTERY BYPASS;  Surgeon: Marty Heck, MD;  Location: [place];  Service: Vascular;  Laterality: Left;   external iliac     status post bilateral and left common iliac artery   INCISION AND DRAINAGE Left 03/31/2021   Procedure: INCISION AND DRAINAGE OF LEFT THIGH WITH DRAIN PLACEMENT;  Surgeon: Marty Heck, MD;  Location: [place];  Service: Vascular;  Laterality: Left;   LIGATION OF ARTERIOVENOUS  FISTULA Left 03/10/2021   Procedure: LIGATION OF LEFT EXTERNAL CAROTID;  Surgeon: Marty Heck, MD;  Location: [place];  Service: Vascular;  Laterality: Left;   lumbar back surgery     x2   LUMBAR DISC SURGERY     x 2   stnt     lower extermity  TENDON REPAIR     to the left arm   TONSILLECTOMY     VEIN HARVEST Left 03/10/2021   Procedure: VEIN HARVEST FROM LEFT SAPHENOUS VEIN;  Surgeon: Marty Heck, MD;  Location: [place];  Service: Vascular;  Laterality: Left;    Allergies: Plavix [clopidogrel]  Medications: Prior to Admission medications   Medication Sig Start Date End Date Taking? Authorizing Provider  albuterol (VENTOLIN HFA) 108 (90 Base) MCG/ACT inhaler Inhale 2 puffs  into the lungs every 6 (six) hours as needed for shortness of breath or wheezing. 10/05/19  Yes [provider]  allopurinol (ZYLOPRIM) 300 MG tablet Take 300 mg by mouth daily. 01/26/21  Yes [provider]  amLODipine (NORVASC) 5 MG tablet Take 1 tablet (5 mg total) by mouth daily. 04/15/22  Yes Sherren Mocha, MD  aspirin EC 81 MG tablet Take 1 tablet (81 mg total) by mouth daily. 08/22/19  Yes Sherren Mocha, MD  aspirin-sod bicarb-citric acid (ALKA-SELTZER) 325 MG TBEF tablet Take 650 mg by mouth every 6 (six) hours as needed (indigestion).   Yes [provider]  azelastine (ASTELIN) 0.1 % nasal spray Place 1-2 sprays into both nostrils 2 (two) times daily as needed for rhinitis. Use in each nostril as directed   Yes [provider]  carvedilol (COREG) 12.5 MG tablet Take 1 tablet (12.5 mg total) by mouth 2 (two) times daily. 04/15/22  Yes Sherren Mocha, MD  ELIQUIS 5 MG TABS tablet TAKE 1 TABLET(5 MG) BY MOUTH TWICE DAILY 07/29/21  Yes [name] T, PA-C  furosemide (LASIX) 40 MG tablet Take 40 mg by mouth 2 (two) times daily. 10/06/20  Yes [provider]  gabapentin (NEURONTIN) 300 MG capsule Take 300 mg by mouth 2 (two) times daily. 02/26/21  Yes [provider]  losartan-hydrochlorothiazide (HYZAAR) 50-12.5 MG tablet Take 1 tablet by mouth daily.   Yes [provider]  omeprazole (PRILOSEC) 20 MG capsule Take 20 mg by mouth daily with breakfast.  11/30/14  Yes [provider]  oxyCODONE-acetaminophen (PERCOCET) 7.5-325 MG tablet Take 0.5 tablets by mouth every 4 (four) hours as needed for moderate pain. 04/06/21  Yes [provider]  potassium chloride (KLOR-CON) 10 MEQ tablet TAKE 1 TABLET(10 MEQ) BY MOUTH DAILY 10/27/21  Yes Sherren Mocha, MD  rosuvastatin (CRESTOR) 20 MG tablet Take 20 mg by mouth daily.   Yes [provider]  tamsulosin (FLOMAX) 0.4 MG CAPS capsule Take 0.4 mg by mouth daily.   Yes  [provider]  TRELEGY ELLIPTA 100-62.5-25 MCG/INH AEPB Inhale 1 puff into the lungs at bedtime.  08/17/19  Yes [provider]  magnesium oxide (MAG-OX) 400 (240 Mg) MG tablet TAKE 1 TABLET BY MOUTH EVERY MORNING AND AT BEDTIME Patient not taking: Reported on 06/02/2022 05/06/22   Liliane Shi, PA-C     Family History  Problem Relation Age of Onset   Hypertension Mother    Hyperlipidemia Mother     Social History   Socioeconomic History   Marital status: Married    Spouse name: Vicky   Number of children: Not on file   Years of education: Not on file   Highest education level: Not on file  Occupational History   Not on file  Tobacco Use   Smoking status: Every Day    Packs/day: 1.00    Years: 50.00    Total pack years: 50.00    Types: Cigarettes    Passive exposure: Current   Smokeless  tobacco: Never   Tobacco comments:    Pack daily 01/05/22  Vaping Use   Vaping Use: Never used  Substance and Sexual Activity   Alcohol use: Yes    Alcohol/week: 7.0 standard drinks of alcohol    Types: 7 Glasses of wine per week    Comment: 1 glass of wine daily 01/05/22   Drug use: No   Sexual activity: Not on file  Other Topics Concern   Not on file  Social History Narrative   Not on file   Social Determinants of Health   Financial Resource Strain: Low Risk  (04/02/2021)   Overall Financial Resource Strain (CARDIA)    Difficulty of Paying Living Expenses: Not hard at all  Food Insecurity: No Food Insecurity (04/02/2021)   Hunger Vital Sign    Worried About Running Out of Food in the Last Year: Never true    Ran Out of Food in the Last Year: Never true  Transportation Needs: No Transportation Needs (04/02/2021)   PRAPARE - Hydrologist (Medical): No    Lack of Transportation (Non-Medical): No  Physical Activity: Not on file  Stress: No Stress Concern Present (04/02/2021)   [place]    Feeling of Stress : Not at all  Social Connections: Not on file    Review of Systems: A 12 point ROS discussed and pertinent positives are indicated in the HPI above.  All other systems are negative.  Vital Signs: BP 118/67   Pulse 84   Temp 97.8 F (36.6 C) (Temporal)   Resp 18   Ht 6\' 1"  (1.854 m)   Wt 215 lb (97.5 kg)   SpO2 95%   BMI 28.37 kg/m   Physical Exam Vitals reviewed.  Constitutional:      General: He is not in acute distress.    Appearance: He is not ill-appearing.  HENT:     Head: Normocephalic and atraumatic.     Nose: Nose normal.     Mouth/Throat:     Mouth: Mucous membranes are moist.     Pharynx: Oropharynx is clear.  Eyes:     Extraocular Movements: Extraocular movements intact.     Conjunctiva/sclera: Conjunctivae normal.  Cardiovascular:     Rate and Rhythm: Normal rate.     Pulses: Normal pulses.  Pulmonary:     Effort: Pulmonary effort is normal. No respiratory distress.  Abdominal:     General: Abdomen is flat.     Palpations: Abdomen is soft.  Skin:    General: Skin is warm and dry.     Comments: Ruddy tough skin of the lower legs bilaterally.  Skin intact.  No obvious edema  Neurological:     General: No focal deficit present.     Mental Status: He is alert.  Psychiatric:        Mood and Affect: Mood normal.        Behavior: Behavior normal.     Imaging: No results found.  Labs:  CBC: Recent Labs    06/22/21 1032 06/06/22 0706  WBC 9.1 10.9*  HGB 11.7* 11.4*  HCT 37.8* 35.9*  PLT 174 179    COAGS: Recent Labs    06/06/22 0706  INR 1.2    BMP: Recent Labs    06/22/21 1032  NA 137  K 4.3  CL 102  CO2 24  GLUCOSE 87  BUN 42*  CALCIUM 9.1  CREATININE 3.14*  GFRNONAA 20*    Assessment and Plan:  Chronic renal disease --for renal biopsy today with planned d/c later this am --is NPO, has held Eliquis, labs reviewed  Risks and benefits of renal biopsy was discussed with the  patient and/or patient's family including, but not limited to bleeding, infection, damage to adjacent structures or low yield requiring additional tests.  All of the questions were answered and there is agreement to proceed.  Consent signed and in chart.   Thank you for this interesting consult.  I greatly enjoyed meeting [name] and look forward to participating in their care.  A copy of this report was sent to the requesting provider on this date.  Electronically Signed: Pasty Spillers, PA 06/06/2022, 7:56 AM   I spent a total of  15 Minutes in face to face in clinical consultation, greater than 50% of which was counseling/coordinating care for chronic kidney disease

## 2022-06-06 NOTE — Discharge Instructions (Signed)
May Resume Eliquis on Wednesday.

## 2022-06-06 NOTE — Procedures (Signed)
Interventional Radiology Procedure Note  Procedure: US guided random LEFT renal biopsy  Complications: None  Estimated Blood Loss: None  Recommendations: - Bedrest x 4 hrs - DC home    Signed,  Criselda Peaches, MD

## 2022-06-06 NOTE — Sedation Documentation (Signed)
Kidney bx site is clean, dry and intact. No drainage noted

## 2022-06-06 NOTE — Progress Notes (Signed)
Per Marylyn Ishihara- Dr. Jeannine Kitten stated patient can resume eliquis on Wednesday.

## 2022-06-16 ENCOUNTER — Encounter (HOSPITAL_COMMUNITY): Payer: Self-pay

## 2022-06-17 LAB — SURGICAL PATHOLOGY

## 2022-06-27 DIAGNOSIS — I5031 Acute diastolic (congestive) heart failure: Secondary | ICD-10-CM | POA: Insufficient documentation

## 2022-06-28 DIAGNOSIS — N400 Enlarged prostate without lower urinary tract symptoms: Secondary | ICD-10-CM | POA: Insufficient documentation

## 2022-06-28 DIAGNOSIS — R911 Solitary pulmonary nodule: Secondary | ICD-10-CM | POA: Insufficient documentation

## 2022-06-28 DIAGNOSIS — K8033 Calculus of bile duct with acute cholangitis with obstruction: Secondary | ICD-10-CM | POA: Insufficient documentation

## 2022-06-28 DIAGNOSIS — R34 Anuria and oliguria: Secondary | ICD-10-CM | POA: Insufficient documentation

## 2022-07-02 DIAGNOSIS — J9601 Acute respiratory failure with hypoxia: Secondary | ICD-10-CM | POA: Insufficient documentation

## 2022-07-02 DIAGNOSIS — U071 COVID-19: Secondary | ICD-10-CM | POA: Insufficient documentation

## 2022-07-03 DIAGNOSIS — D696 Thrombocytopenia, unspecified: Secondary | ICD-10-CM | POA: Insufficient documentation

## 2022-07-25 ENCOUNTER — Other Ambulatory Visit: Payer: Self-pay | Admitting: Cardiovascular Disease

## 2022-07-25 ENCOUNTER — Other Ambulatory Visit: Payer: Self-pay

## 2022-08-24 ENCOUNTER — Other Ambulatory Visit: Payer: Self-pay | Admitting: Physician Assistant

## 2022-08-24 NOTE — Telephone Encounter (Signed)
Prescription refill request for Eliquis received. Indication: AF Last office visit: 04/15/22  Ezzie Dural MD Scr: 3.60 on 08/15/22 Age: 76 Weight: 102.7kg  Based on above findings Eliquis 5mg  twice daily is the appropriate dose.  Refill approved.

## 2022-10-07 IMAGING — MR MR HEAD W/O CM
9 of 10 series · 38 of 48 positions shown · non-contrast
Comparison: Head CT March 09, 2021.

CLINICAL DATA: Neuro deficit, acute, stroke suspected.

EXAM:
MRI HEAD WITHOUT CONTRAST
TECHNIQUE: Multiplanar, multiecho pulse sequences of the brain and surrounding
structures were obtained without intravenous contrast.

[Series 3: DWI · axial · 3.0mm · 1.09mm/px · z∈[-57,+102]mm · 11 of 108 slices shown (1 of 4)]
[im 1/108]
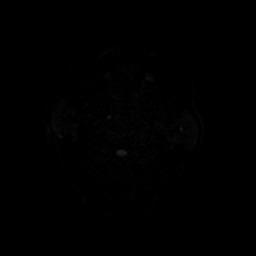
[im 11/108]
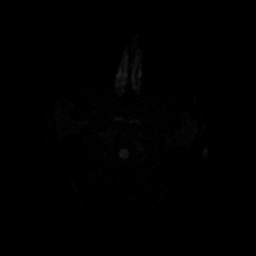
[im 22/108]
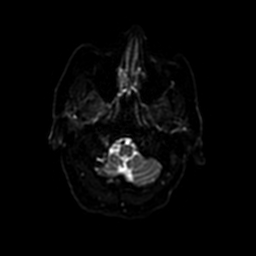
[im 33/108]
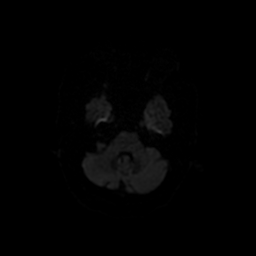
[im 43/108]
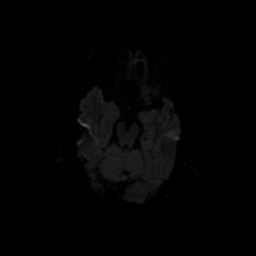
[im 54/108]
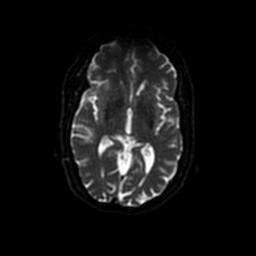
[im 65/108]
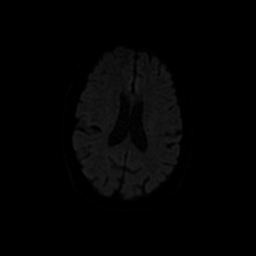
[im 75/108]
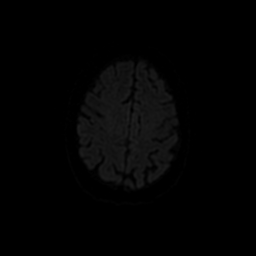
[im 86/108]
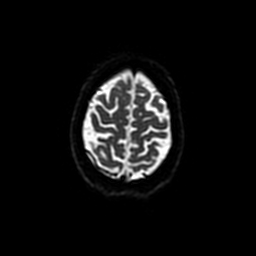
[im 97/108]
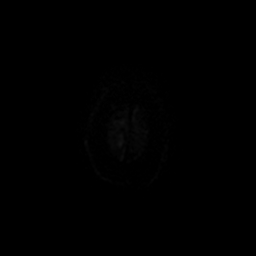
[im 108/108]
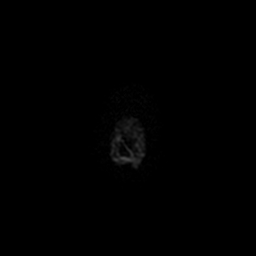

[Series 4: DWI · coronal · 5.0mm · 1.09mm/px · 8 of 84 slices shown (2 of 4)]
[im 1/84]
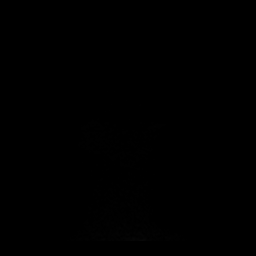
[im 12/84]
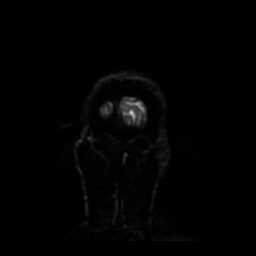
[im 24/84]
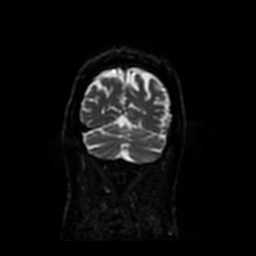
[im 36/84]
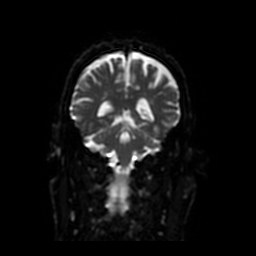
[im 48/84]
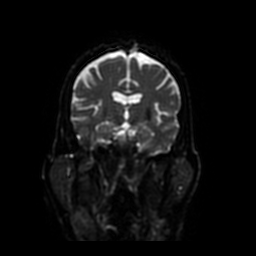
[im 60/84]
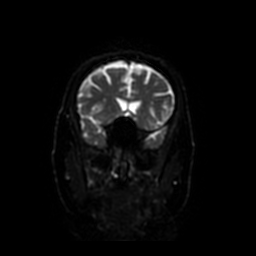
[im 72/84]
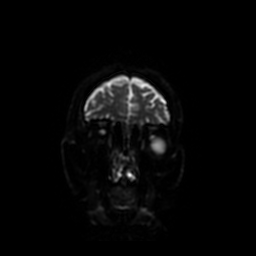
[im 84/84]
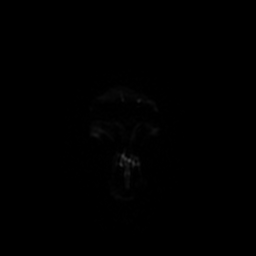

[Series 5: T1 · sagittal · 5.0mm · 0.47mm/px · 2 of 22 slices shown (1 of 2)]
[im 1/22]
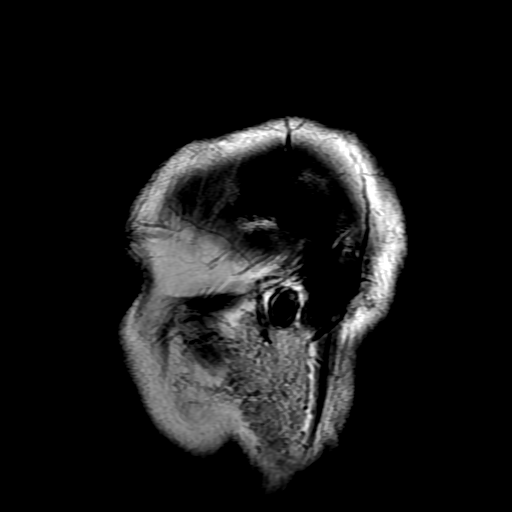
[im 22/22]
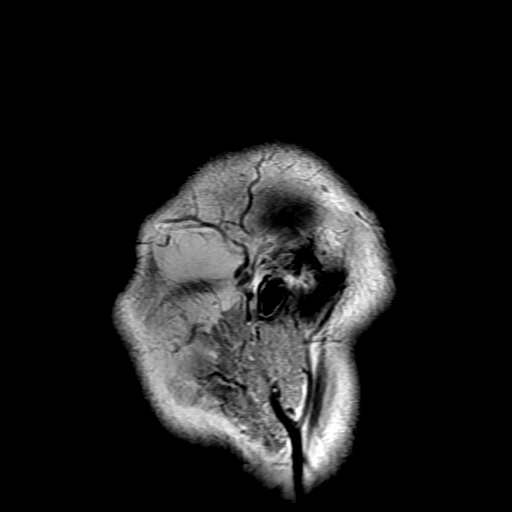

[Series 6: T2 · axial · 5.0mm · 0.43mm/px · z∈[-54,+90]mm · 2 of 25 slices shown (1 of 2)]
[im 1/25]
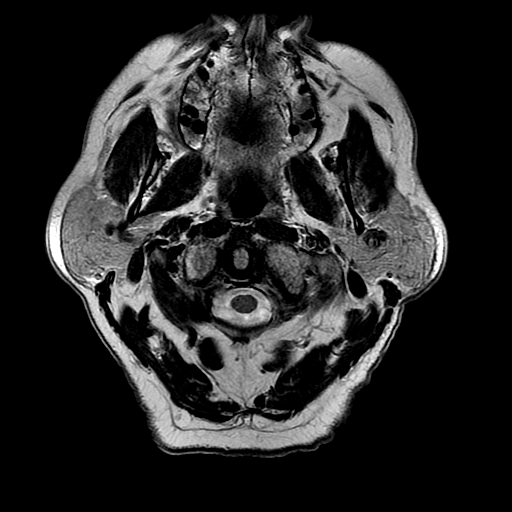
[im 25/25]
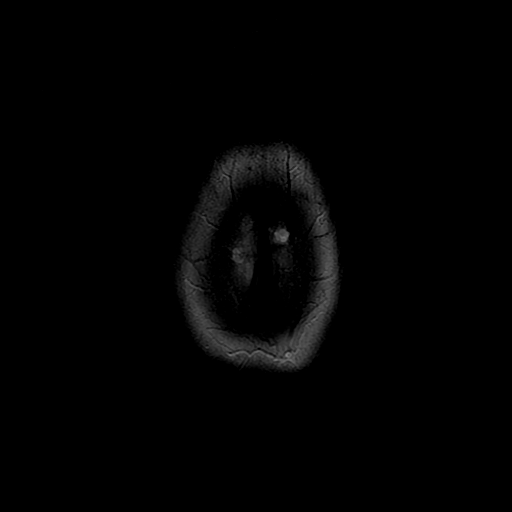

[Series 7: FLAIR · axial · 3.0mm · 0.43mm/px · z∈[-54,+90]mm · 2 of 25 slices shown]
[im 1/25]
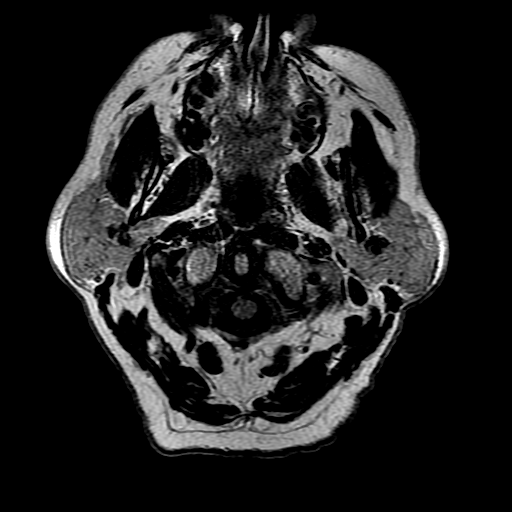
[im 25/25]
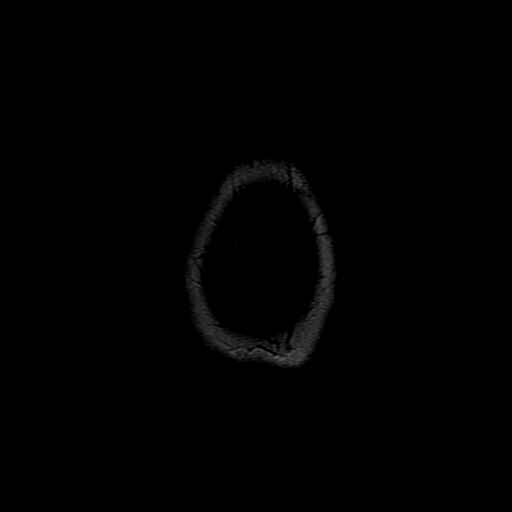

[Series 9: T1 · axial · 3.0mm · 0.47mm/px · z∈[-56,-39]mm · 2 of 100 slices shown (2 of 2)]
[im 1/100]
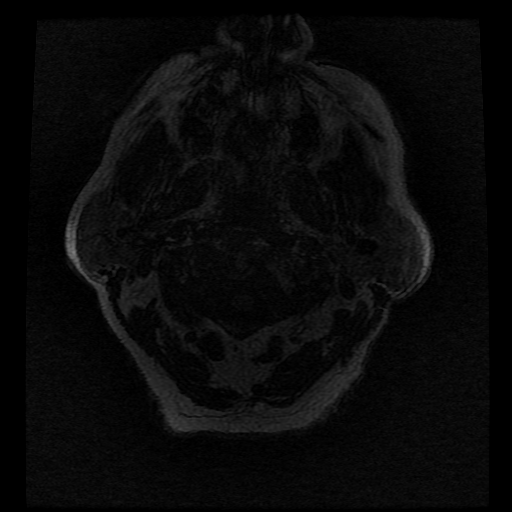
[im 12/100]
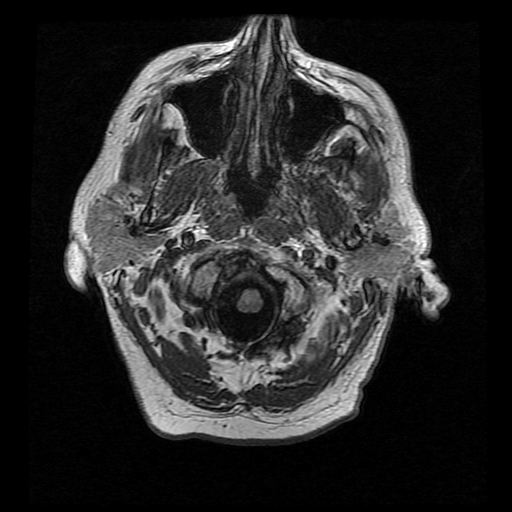

[Series 10: T2 · coronal · 5.0mm · 0.39mm/px · 2 of 24 slices shown (2 of 2)]
[im 1/24]
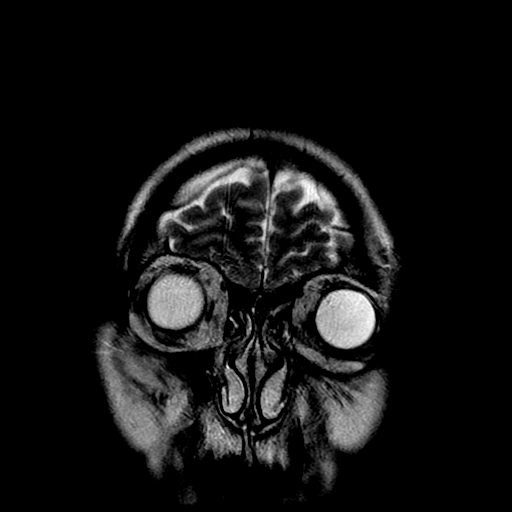
[im 24/24]
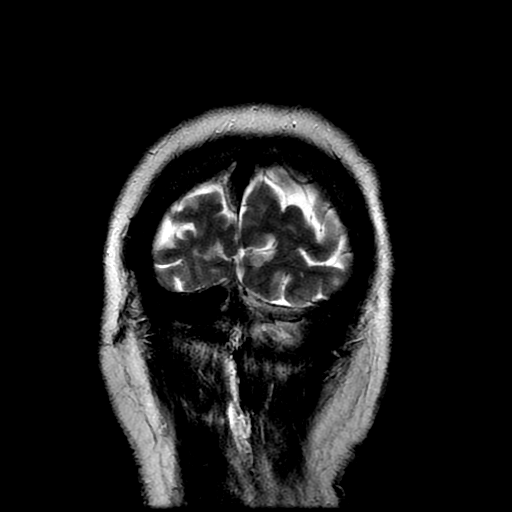

[Series 300: DWI · axial · 3.0mm · 1.09mm/px · z∈[-57,+102]mm · 5 of 54 slices shown (3 of 4)]
[im 1/54]
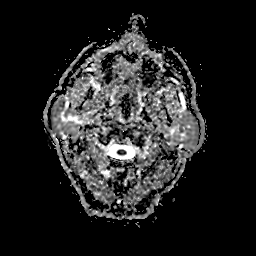
[im 14/54]
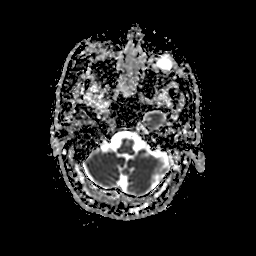
[im 27/54]
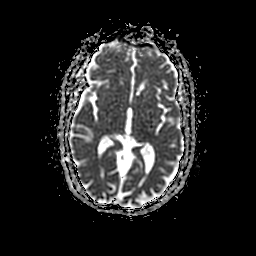
[im 40/54]
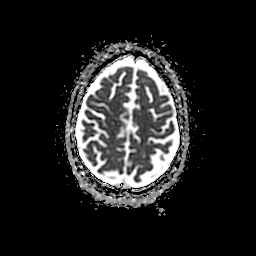
[im 54/54]
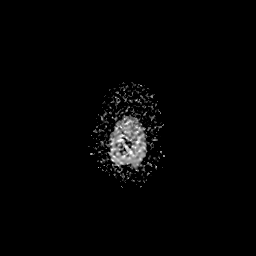

[Series 400: DWI · coronal · 5.0mm · 1.09mm/px · 4 of 42 slices shown (4 of 4)]
[im 1/42]
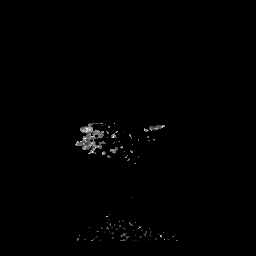
[im 14/42]
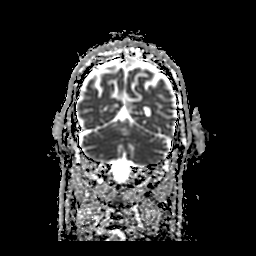
[im 28/42]
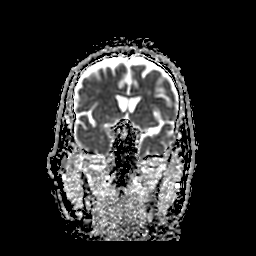
[im 42/42]
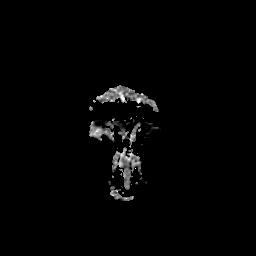

[38 of 48 positions shown; findings below may reference images not displayed]

FINDINGS: Brain: Small focus of restricted diffusion within the left
cerebellar hemisphere consistent with acute/subacute infarct. Remote
lacunar infarct in the right corona radiata. Scattered foci of T2
hyperintensity within the white matter of the cerebral hemispheres,
nonspecific, most likely related to chronic small vessel ischemia.
No hemorrhage, hydrocephalus, extra-axial collection or mass lesion.

Vascular: Normal flow voids.

Skull and upper cervical spine: Normal marrow signal.

Sinuses/Orbits: Negative.

Other: Left mastoid effusion.
IMPRESSION: 1. Small acute/subacute infarct within the left cerebellar
hemisphere.
2. Remote lacunar infarct in the right corona radiata.
3. Mild chronic small vessel ischemia.
4. Left mastoid effusion.

These results were called by secure text paging at the time of
interpretation on 03/09/2021 at [DATE] to provider DIONICIA GN .

## 2022-10-18 ENCOUNTER — Other Ambulatory Visit: Payer: Self-pay | Admitting: Physician Assistant

## 2022-10-18 DIAGNOSIS — I480 Paroxysmal atrial fibrillation: Secondary | ICD-10-CM

## 2022-10-18 NOTE — Telephone Encounter (Signed)
Prescription refill request for Eliquis received. Indication: Afib  Last office visit: 04/15/22 [name])  Scr: 5.10 (10/11/22)   Age: 76 Weight: 97.5kg  Appropriate dose and refill sent to requested pharmacy.

## 2022-10-31 ENCOUNTER — Ambulatory Visit: Payer: Medicare Other | Attending: Nurse Practitioner | Admitting: Nurse Practitioner

## 2022-10-31 ENCOUNTER — Encounter: Payer: Self-pay | Admitting: Nurse Practitioner

## 2022-10-31 VITALS — BP 120/66 | HR 104 | Ht 73.0 in | Wt 216.4 lb

## 2022-10-31 DIAGNOSIS — R0609 Other forms of dyspnea: Secondary | ICD-10-CM | POA: Insufficient documentation

## 2022-10-31 DIAGNOSIS — Z01818 Encounter for other preprocedural examination: Secondary | ICD-10-CM | POA: Insufficient documentation

## 2022-10-31 DIAGNOSIS — I4819 Other persistent atrial fibrillation: Secondary | ICD-10-CM | POA: Insufficient documentation

## 2022-10-31 DIAGNOSIS — D6869 Other thrombophilia: Secondary | ICD-10-CM | POA: Diagnosis present

## 2022-10-31 DIAGNOSIS — N184 Chronic kidney disease, stage 4 (severe): Secondary | ICD-10-CM | POA: Insufficient documentation

## 2022-10-31 DIAGNOSIS — I10 Essential (primary) hypertension: Secondary | ICD-10-CM | POA: Diagnosis not present

## 2022-10-31 NOTE — Patient Instructions (Signed)
Medication Instructions:  Your physician recommends that you continue on your current medications as directed. Please refer to the Current Medication list given to you today.  BUT, HOLD THE HYZAAR & FUROSEMIDE UNTIL WE CALL YOU  *If you need a refill on your cardiac medications before your next appointment, please call your pharmacy*   Lab Work: TODAY:  BMET  If you have labs (blood work) drawn today and your tests are completely normal, you will receive your results only by: MyChart Message (if you have MyChart) OR A paper copy in the mail If you have any lab test that is abnormal or we need to change your treatment, we will call you to review the results.   Testing/Procedures: Your physician has requested that you have a lexiscan myoview. For further information please visit [email or web address]. Please follow instruction sheet, BELOW:    You are scheduled for a Myocardial Perfusion Imaging Study on Please arrive 15 minutes prior to your appointment time for registration and insurance purposes.  The test will take approximately 3 to 4 hours to complete; you may bring reading material.  If someone comes with you to your appointment, they will need to remain in the main lobby due to limited space in the testing area. **If you are pregnant or breastfeeding, please notify the nuclear lab prior to your appointment**  How to prepare for your Myocardial Perfusion Test: Do not eat or drink 3 hours prior to your test, except you may have water. Do not consume products containing caffeine (regular or decaffeinated) 12 hours prior to your test. (ex: coffee, chocolate, sodas, tea). Do bring a list of your current medications with you.  If not listed below, you may take your medications as normal. Do wear comfortable clothes (no dresses or overalls) and walking shoes, tennis shoes preferred (No heels or open toe shoes are allowed). Do NOT wear cologne, perfume, aftershave, or lotions (deodorant  is allowed). If these instructions are not followed, your test will have to be rescheduled.    Follow-Up: At [facility], you and your health needs are our priority.  As part of our continuing mission to provide you with exceptional heart care, we have created designated Provider Care Teams.  These Care Teams include your primary Cardiologist (physician) and Advanced Practice Providers (APPs -  Physician Assistants and Nurse Practitioners) who all work together to provide you with the care you need, when you need it.  We recommend signing up for the patient portal called "MyChart".  Sign up information is provided on this After Visit Summary.  MyChart is used to connect with patients for Virtual Visits (Telemedicine).  Patients are able to view lab/test results, encounter notes, upcoming appointments, etc.  Non-urgent messages can be sent to your provider as well.   To learn more about what you can do with MyChart, go to [email or web address].    Your next appointment:   6 month(s)  The format for your next appointment:   In Person  Provider:   Sherren Mocha, MD     Other Instructions   Important Information About Sugar

## 2022-10-31 NOTE — Progress Notes (Signed)
Cardiology Office Note:    Date:  10/31/2022   ID:  [name], DOB 10/12/1946, MRN [id number]  PCP:  Raelene Bott, MD   [facility] Providers Cardiologist:  Sherren Mocha, MD Cardiology APP:  Sharmon Revere     Referring MD: Raelene Bott, MD   Chief Complaint: preoperative cardiac evaluation  History of Present Illness:    [name] is a very pleasant 76 y.o. male with a hx of chronic HFpEF, HTN, CKD, bilateral carotid artery stenosis, persistent atrial fibrillation on chronic anticoagulation, and CVA.  Prior L CEA 2007, L CCA stenting in 2011 due to restenosis, L ICA stenting 07/2019 due to restenosis, followed by vascular surgery. Persistent atrial fibrillation with cardioversion 11/2019.  Admission 03/2021 for redo left CEA and L CCA to L ICA bypass. Given recurrent atrial fibrillation required repeat cardioversion 06/22/2021.  Seen by Dr. Burt Knack on 04/15/22. Per review of notes, had cardioversion last year and was in sinus rhythm as recently as January 05, 2022 when he had an EKG confirming this. He cannot tell if he is in atrial fibrillation or sinus rhythm. Echo from an outside facility was reviewed by Dr. Burt Knack April 2023 at which time he was noted to be in atrial fibrillation.  He denied chronic dyspnea and continues to smoke. Chronic leg swelling for which he takes diuretic therapy but has to be careful because of his kidney dysfunction.  Today, he is here with wife for follow-up. May need to undergo a procedure and will require cardiac clearance. Hospitalized for 17 days at [facility] and then admitted to rehab facility July 2023 with COVID which was complicated by AKI and gallstones. Feels very deconditioned, can only walk about 50 feet without stopping to rest. Has had numerous visits with primary care, GI, urology since discharge. Awaiting possible cholecystectomy but kidney function has been abnormal, does not have a specific procedure date yet. He denies  specific cardiac concerns.  No chest pain, palpitations, presyncope, syncope, orthopnea, PND. Has bilateral LE edema for which he recently restarted Lasix. He had been holding it for several weeks. No problems with BP during admission. Does not know specifics about current health status and his medications.   Past Medical History:  Diagnosis Date   Arthritis    CKD (chronic kidney disease), stage III (HCC)    COPD (chronic obstructive pulmonary disease) (HCC)    Dyspnea on exertion    Edema    HTN (hypertension)    Hyperlipidemia    Left carotid stenosis    a. 2007 s/p L CEA;  b. 2011 s/p L common carotid stenting 2/2 restenosis;  c. 08/3733 u/s: RICA <28, LICA 76-81%, patent LCCA stent.   PAD (peripheral artery disease) (HCC)    a. s/p prior RSFA, REIA, distal LSFA (known to be occluded), LEIA, and LCIA stenting;  b. 01/2015 ABI's: R 0.78, L 0.53.   Stroke [facility]) 03/2021   no residual     Past Surgical History:  Procedure Laterality Date   ABDOMINAL AORTAGRAM N/A 02/05/2015   Procedure: ABDOMINAL AORTAGRAM;  Surgeon: Conrad [place], MD;  Location: [facility] CATH LAB;  Service: Cardiovascular;  Laterality: N/A;   AORTIC ARCH ANGIOGRAPHY N/A 07/19/2019   Procedure: AORTIC ARCH ANGIOGRAPHY;  Surgeon: Elam Dutch, MD;  Location: [place] CV LAB;  Service: Cardiovascular;  Laterality: N/A;   CARDIOVERSION N/A 11/06/2019   Procedure: CARDIOVERSION;  Surgeon: Pixie Casino, MD;  Location: [place];  Service: Cardiovascular;  Laterality:  N/A;   CARDIOVERSION N/A 06/22/2021   Procedure: CARDIOVERSION;  Surgeon: Lelon Perla, MD;  Location: [facility] ENDOSCOPY;  Service: Cardiovascular;  Laterality: N/A;   CAROTID ANGIOGRAPHY N/A 07/19/2019   Procedure: CAROTID ANGIOGRAPHY;  Surgeon: Elam Dutch, MD;  Location: [place] CV LAB;  Service: Cardiovascular;  Laterality: N/A;   carotid endarterecotomy     CAROTID PTA/STENT INTERVENTION Left 08/02/2019   Procedure: CAROTID PTA/STENT  INTERVENTION;  Surgeon: Elam Dutch, MD;  Location: [place] CV LAB;  Service: Cardiovascular;  Laterality: Left;   ENDARTERECTOMY Left 03/10/2021   Procedure: REDO LEFT CAROTID ENDARTERECTOMY WITH LEFT COMMON TO INTERNAL CAROTID ARTERY BYPASS;  Surgeon: Marty Heck, MD;  Location: [place];  Service: Vascular;  Laterality: Left;   external iliac     status post bilateral and left common iliac artery   INCISION AND DRAINAGE Left 03/31/2021   Procedure: INCISION AND DRAINAGE OF LEFT THIGH WITH DRAIN PLACEMENT;  Surgeon: Marty Heck, MD;  Location: [place];  Service: Vascular;  Laterality: Left;   LIGATION OF ARTERIOVENOUS  FISTULA Left 03/10/2021   Procedure: LIGATION OF LEFT EXTERNAL CAROTID;  Surgeon: Marty Heck, MD;  Location: [place];  Service: Vascular;  Laterality: Left;   lumbar back surgery     x2   LUMBAR DISC SURGERY     x 2   stnt     lower extermity   TENDON REPAIR     to the left arm   TONSILLECTOMY     VEIN HARVEST Left 03/10/2021   Procedure: VEIN HARVEST FROM LEFT SAPHENOUS VEIN;  Surgeon: Marty Heck, MD;  Location: [facility] OR;  Service: Vascular;  Laterality: Left;    Current Medications: Current Meds  Medication Sig   albuterol (VENTOLIN HFA) 108 (90 Base) MCG/ACT inhaler Inhale 2 puffs into the lungs every 6 (six) hours as needed for shortness of breath or wheezing.   allopurinol (ZYLOPRIM) 300 MG tablet Take 300 mg by mouth daily.   aspirin-sod bicarb-citric acid (ALKA-SELTZER) 325 MG TBEF tablet Take 650 mg by mouth every 6 (six) hours as needed (indigestion).   carvedilol (COREG) 12.5 MG tablet Take 1 tablet (12.5 mg total) by mouth 2 (two) times daily.   ELIQUIS 5 MG TABS tablet TAKE 1 TABLET(5 MG) BY MOUTH TWICE DAILY   furosemide (LASIX) 40 MG tablet Take 40 mg by mouth 2 (two) times daily.   gabapentin (NEURONTIN) 300 MG capsule Take 300 mg by mouth 2 (two) times daily.   losartan-hydrochlorothiazide (HYZAAR) 50-12.5 MG tablet Take  1 tablet by mouth daily.   omeprazole (PRILOSEC) 20 MG capsule Take 20 mg by mouth daily with breakfast.    ondansetron (ZOFRAN) 4 MG tablet as needed.   oxyCODONE-acetaminophen (PERCOCET) 7.5-325 MG tablet Take 0.5 tablets by mouth every 4 (four) hours as needed for moderate pain.   potassium chloride (KLOR-CON) 10 MEQ tablet TAKE 1 TABLET(10 MEQ) BY MOUTH DAILY   rosuvastatin (CRESTOR) 20 MG tablet Take 20 mg by mouth daily.   senna (SENOKOT) 8.6 MG TABS tablet Take 2 tablets by mouth at bedtime.   sucralfate (CARAFATE) 1 g tablet Take by mouth daily.   TRELEGY ELLIPTA 100-62.5-25 MCG/INH AEPB Inhale 1 puff into the lungs at bedtime.    [DISCONTINUED] amLODipine (NORVASC) 5 MG tablet Take 1 tablet (5 mg total) by mouth daily.   [DISCONTINUED] aspirin EC 81 MG tablet Take 1 tablet (81 mg total) by mouth daily.   [DISCONTINUED] azelastine (ASTELIN) 0.1 %  nasal spray Place 1-2 sprays into both nostrils 2 (two) times daily as needed for rhinitis. Use in each nostril as directed   [DISCONTINUED] budesonide-formoterol (SYMBICORT) 160-4.5 MCG/ACT inhaler Inhale into the lungs.   [DISCONTINUED] magnesium oxide (MAG-OX) 400 (240 Mg) MG tablet TAKE 1 TABLET BY MOUTH EVERY MORNING AND AT BEDTIME   [DISCONTINUED] metoprolol tartrate (LOPRESSOR) 25 MG tablet Take 25 mg by mouth daily.   [DISCONTINUED] nicotine (NICODERM CQ - DOSED IN MG/24 HOURS) 21 mg/24hr patch 21 mg daily.   [DISCONTINUED] oxyCODONE (OXY IR/ROXICODONE) 5 MG immediate release tablet Take by mouth.   [DISCONTINUED] sevelamer (RENAGEL) 800 MG tablet Take 2,400 mg by mouth 3 (three) times daily.   [DISCONTINUED] tamsulosin (FLOMAX) 0.4 MG CAPS capsule Take 0.4 mg by mouth daily.     Allergies:   Plavix [clopidogrel]   Social History   Socioeconomic History   Marital status: Married    Spouse name: Vicky   Number of children: Not on file   Years of education: Not on file   Highest education level: Not on file  Occupational History    Not on file  Tobacco Use   Smoking status: Every Day    Packs/day: 1.00    Years: 50.00    Total pack years: 50.00    Types: Cigarettes    Passive exposure: Current   Smokeless tobacco: Never   Tobacco comments:    Pack daily 01/05/22  Vaping Use   Vaping Use: Never used  Substance and Sexual Activity   Alcohol use: Yes    Alcohol/week: 7.0 standard drinks of alcohol    Types: 7 Glasses of wine per week    Comment: 1 glass of wine daily 01/05/22   Drug use: No   Sexual activity: Not on file  Other Topics Concern   Not on file  Social History Narrative   Not on file   Social Determinants of Health   Financial Resource Strain: Low Risk  (04/02/2021)   Overall Financial Resource Strain (CARDIA)    Difficulty of Paying Living Expenses: Not hard at all  Food Insecurity: No Food Insecurity (04/02/2021)   Hunger Vital Sign    Worried About Running Out of Food in the Last Year: Never true    Ran Out of Food in the Last Year: Never true  Transportation Needs: No Transportation Needs (04/02/2021)   PRAPARE - Hydrologist (Medical): No    Lack of Transportation (Non-Medical): No  Physical Activity: Not on file  Stress: No Stress Concern Present (04/02/2021)   [place]    Feeling of Stress : Not at all  Social Connections: Not on file     Family History: The patient's family history includes Hyperlipidemia in his mother; Hypertension in his mother.  ROS:   Please see the history of present illness.    + bilateral LE edema  All other systems reviewed and are negative.  Labs/Other Studies Reviewed:    The following studies were reviewed today:  Echo 06/28/22 [facility] system)  Summary   1. Technically difficult study due to chest wall/lung interference.    2. The left ventricle is normal in size with probably normal wall thickness.    3. The left ventricular systolic function is  overall normal, LVEF is  visually estimated at 55%.    4. Mitral annular calcification is present (mild).    5. The left atrium is mildly dilated in size.  6. The right ventricle is normal in size, with normal systolic function.    7. The right atrium is mildly dilated  in size.  Echo 03/10/21  1. Left ventricular ejection fraction, by estimation, is 55 to 60%. The  left ventricle has normal function. The left ventricle has no regional  wall motion abnormalities. There is mild left ventricular hypertrophy.  Left ventricular diastolic parameters  are consistent with Grade I diastolic dysfunction (impaired relaxation).   2. Right ventricular systolic function is normal. The right ventricular  size is normal.   3. The mitral valve is grossly normal. Trivial mitral valve  regurgitation.   4. The aortic valve is tricuspid. Aortic valve regurgitation is not  visualized.   5. The inferior vena cava is normal in size with <50% respiratory  variability, suggesting right atrial pressure of 8 mmHg.   Comparison(s): No significant change from prior study. 08/27/2019: LVEF  55-60%.   Conclusion(s)/Recommendation(s): No intracardiac source of embolism  detected on this transthoracic study. A transesophageal echocardiogram is  recommended to exclude cardiac source of embolism if clinically indicated.    Risk Assessment/Calculations:    CHA2DS2-VASc Score = 7   This indicates a 11.2% annual risk of stroke. The patient's score is based upon: CHF History: 1 HTN History: 1 Diabetes History: 0 Stroke History: 2 Vascular Disease History: 1 Age Score: 2 Gender Score: 0    Physical Exam:    VS:  BP 120/66   Pulse (!) 104   Ht 6\' 1"  (1.854 m)   Wt 216 lb 6.4 oz (98.2 kg)   SpO2 95%   BMI 28.55 kg/m     Wt Readings from Last 3 Encounters:  10/31/22 216 lb 6.4 oz (98.2 kg)  06/06/22 215 lb (97.5 kg)  04/15/22 226 lb 6.4 oz (102.7 kg)     GEN:  Well nourished, well developed in no  acute distress HEENT: Normal NECK: No JVD; No carotid bruits CARDIAC: Irregular RR, no murmurs, rubs, gallops RESPIRATORY:  Clear to auscultation without rales, wheezing or rhonchi  ABDOMEN: Soft, non-tender, non-distended MUSCULOSKELETAL:  Bilateral LE edema and scaly skin. 2+ pedal pulses, equal bilaterally SKIN: Warm and dry NEUROLOGIC:  Alert and oriented x 3 PSYCHIATRIC:  Normal affect   EKG:  EKG is ordered today.  The ekg ordered today demonstrates atrial fibrillation with rapid ventricular response at 104 bpm, nonspecific TW abnormality, no acute change from previous    Diagnoses:    1. Persistent atrial fibrillation ([place])   2. Essential hypertension   3. Preoperative clearance   4. DOE (dyspnea on exertion)   5. Secondary hypercoagulable state ([place])   6. CKD (chronic kidney disease) stage 4, GFR 15-29 ml/min (HCC)    Assessment and Plan:     Preoperative cardiac evaluation: Lengthy hospitalization for COVID complicated by kidney disease and gallstones. Currently has biliary drainage tube, is awaiting a procedure and/or cholecystectomy. He is not able to achieve > 4 METS activity. Will get Lexiscan Myoview to evaluate for ischemia. RCRI for MACE is 6.6%.   DOE: Cannot walk 50 feet without stopping due to shortness of breath. Is quite deconditioned from lengthy hospitalization and complicated recovery. Normal LVEF on echo July 2023. Bilateral LE edema, likely multifactorial in the setting of deconditioning, CKD, sedentary state. Will get Lexiscan myoview to evaluate for ischemia.   CKD: SCr 5.10 on 10/11/22, checked by PCP. Patient states he was not aware.  Will recheck BMET today. Advised him to hold furosemide and Hyzaar  until we call him. Would favor referral to nephrology if Scr remains elevated.   Hypertension: BP is well-controlled. Holding Hyzaar until lab results are returned. Could consider d/c Hyzaar and increasing carvedilol if Scr remains elevated.   Persistent  atrial fibrillation on chronic anticoagulation: HR initially elevated, improved to 90s bpm with rest. He denies concerns with a fib during hospitalization and recovery. Does not monitor on a consistent basis. No bleeding concerns on Eliquis. Will check bmet today as SCr was quite elevated at last check.   Shared Decision Making/Informed Consent The risks [chest pain, shortness of breath, cardiac arrhythmias, dizziness, blood pressure fluctuations, myocardial infarction, stroke/transient ischemic attack, nausea, vomiting, allergic reaction, radiation exposure, metallic taste sensation and life-threatening complications (estimated to be 1 in 10,000)], benefits (risk stratification, diagnosing coronary artery disease, treatment guidance) and alternatives of a nuclear stress test were discussed in detail with Mr. Monforte and he agrees to proceed.   Disposition: 6 months with Dr. Burt Knack  Medication Adjustments/Labs and Tests Ordered: Current medicines are reviewed at length with the patient today.  Concerns regarding medicines are outlined above.  Orders Placed This Encounter  Procedures   Basic metabolic panel   Cardiac Stress Test: Informed Consent Details: Physician/Practitioner Attestation; Transcribe to consent form and obtain patient signature   MYOCARDIAL PERFUSION IMAGING   EKG 12-Lead   No orders of the defined types were placed in this encounter.   Patient Instructions  Medication Instructions:  Your physician recommends that you continue on your current medications as directed. Please refer to the Current Medication list given to you today.  BUT, HOLD THE HYZAAR & FUROSEMIDE UNTIL WE CALL YOU  *If you need a refill on your cardiac medications before your next appointment, please call your pharmacy*   Lab Work: TODAY:  BMET  If you have labs (blood work) drawn today and your tests are completely normal, you will receive your results only by: MyChart Message (if you have MyChart)  OR A paper copy in the mail If you have any lab test that is abnormal or we need to change your treatment, we will call you to review the results.   Testing/Procedures: Your physician has requested that you have a lexiscan myoview. For further information please visit [email or web address]. Please follow instruction sheet, BELOW:    You are scheduled for a Myocardial Perfusion Imaging Study on Please arrive 15 minutes prior to your appointment time for registration and insurance purposes.  The test will take approximately 3 to 4 hours to complete; you may bring reading material.  If someone comes with you to your appointment, they will need to remain in the main lobby due to limited space in the testing area. **If you are pregnant or breastfeeding, please notify the nuclear lab prior to your appointment**  How to prepare for your Myocardial Perfusion Test: Do not eat or drink 3 hours prior to your test, except you may have water. Do not consume products containing caffeine (regular or decaffeinated) 12 hours prior to your test. (ex: coffee, chocolate, sodas, tea). Do bring a list of your current medications with you.  If not listed below, you may take your medications as normal. Do wear comfortable clothes (no dresses or overalls) and walking shoes, tennis shoes preferred (No heels or open toe shoes are allowed). Do NOT wear cologne, perfume, aftershave, or lotions (deodorant is allowed). If these instructions are not followed, your test will have to be rescheduled.    Follow-Up: At [facility], you  and your health needs are our priority.  As part of our continuing mission to provide you with exceptional heart care, we have created designated Provider Care Teams.  These Care Teams include your primary Cardiologist (physician) and Advanced Practice Providers (APPs -  Physician Assistants and Nurse Practitioners) who all work together to provide you with the care you need, when you  need it.  We recommend signing up for the patient portal called "MyChart".  Sign up information is provided on this After Visit Summary.  MyChart is used to connect with patients for Virtual Visits (Telemedicine).  Patients are able to view lab/test results, encounter notes, upcoming appointments, etc.  Non-urgent messages can be sent to your provider as well.   To learn more about what you can do with MyChart, go to [email or web address].    Your next appointment:   6 month(s)  The format for your next appointment:   In Person  Provider:   Sherren Mocha, MD     Other Instructions   Important Information About Sugar         Signed, Emmaline Life, NP  10/31/2022 4:48 PM    [place]

## 2022-11-01 LAB — BASIC METABOLIC PANEL
BUN/Creatinine Ratio: 11 (ref 10–24)
BUN: 57 mg/dL — ABNORMAL HIGH (ref 8–27)
CO2: 27 mmol/L (ref 20–29)
Calcium: 7.8 mg/dL — ABNORMAL LOW (ref 8.6–10.2)
Chloride: 103 mmol/L (ref 96–106)
Creatinine, Ser: 4.99 mg/dL — ABNORMAL HIGH (ref 0.76–1.27)
Glucose: 89 mg/dL (ref 70–99)
Potassium: 4 mmol/L (ref 3.5–5.2)
Sodium: 143 mmol/L (ref 134–144)
eGFR: 11 mL/min/{1.73_m2} — ABNORMAL LOW (ref >59)

## 2022-11-04 ENCOUNTER — Ambulatory Visit (HOSPITAL_COMMUNITY): Payer: Medicare Other | Attending: Nurse Practitioner

## 2022-11-04 DIAGNOSIS — R0609 Other forms of dyspnea: Secondary | ICD-10-CM | POA: Diagnosis present

## 2022-11-04 DIAGNOSIS — I4819 Other persistent atrial fibrillation: Secondary | ICD-10-CM | POA: Diagnosis present

## 2022-11-04 DIAGNOSIS — Z01818 Encounter for other preprocedural examination: Secondary | ICD-10-CM | POA: Diagnosis present

## 2022-11-04 DIAGNOSIS — I10 Essential (primary) hypertension: Secondary | ICD-10-CM | POA: Insufficient documentation

## 2022-11-04 LAB — MYOCARDIAL PERFUSION IMAGING
Peak HR: 125 {beats}/min
Rest HR: 99 {beats}/min
Rest Nuclear Isotope Dose: 10.3 mCi
SDS: 1
SRS: 0
SSS: 1
ST Depression (mm): 0 mm
Stress Nuclear Isotope Dose: 30.6 mCi

## 2022-11-04 MED ORDER — REGADENOSON 0.4 MG/5ML IV SOLN
0.4000 mg | Freq: Once | INTRAVENOUS | Status: AC
  Administered 2022-11-04: 0.4 mg via INTRAVENOUS

## 2022-11-04 MED ORDER — TECHNETIUM TC 99M TETROFOSMIN IV KIT
30.6000 | PACK | Freq: Once | INTRAVENOUS | Status: AC | PRN
  Administered 2022-11-04: 30.6 via INTRAVENOUS

## 2022-11-04 MED ORDER — TECHNETIUM TC 99M TETROFOSMIN IV KIT
10.3000 | PACK | Freq: Once | INTRAVENOUS | Status: AC | PRN
  Administered 2022-11-04: 10.3 via INTRAVENOUS

## 2023-04-21 ENCOUNTER — Other Ambulatory Visit: Payer: Self-pay | Admitting: Cardiovascular Disease

## 2023-04-21 DIAGNOSIS — I480 Paroxysmal atrial fibrillation: Secondary | ICD-10-CM

## 2023-04-21 NOTE — Telephone Encounter (Signed)
Prescription refill request for Eliquis received. Indication:afib Last office visit:11/23 Scr:2.8  3/24 Age: 77 Weight:98.2  kg  Prescription refilled

## 2023-04-24 ENCOUNTER — Ambulatory Visit: Payer: Medicare Other | Admitting: Cardiovascular Disease

## 2023-10-19 ENCOUNTER — Other Ambulatory Visit: Payer: Self-pay | Admitting: Cardiovascular Disease

## 2023-10-19 DIAGNOSIS — I480 Paroxysmal atrial fibrillation: Secondary | ICD-10-CM

## 2023-10-19 NOTE — Telephone Encounter (Signed)
Prescription refill request for Eliquis received. Indication: AF Last office visit: 10/31/22  Benjamine Sprague NP Scr: 3.08 on 10/09/23  Epic Age: 77 Weight: 98.2kg  Based on above findings Eliquis 5mg  twice daily is the appropriate dose.  Pt is past due for appt with MD.  Message sent to schedulers.  Refill approved.

## 2024-01-17 ENCOUNTER — Other Ambulatory Visit: Payer: Self-pay | Admitting: Cardiovascular Disease

## 2024-01-17 DIAGNOSIS — I480 Paroxysmal atrial fibrillation: Secondary | ICD-10-CM

## 2024-01-17 NOTE — Telephone Encounter (Signed)
Prescription refill request for Eliquis received. Indication:afib Last office visit:needs appt Scr:3.56  2/25 Age: 78 Weight:98.2  kg  Prescription refilled

## 2024-03-05 DEATH — deceased
